# Patient Record
Sex: Female | Born: 1955 | Race: Black or African American | Hispanic: No | Marital: Married | Smoking: Never smoker
Health system: Southern US, Community
[De-identification: ages and names within clinical notes are randomized; demographics above are authoritative.]

## PROBLEM LIST (undated history)

## (undated) DIAGNOSIS — M3500 Sicca syndrome, unspecified: Secondary | ICD-10-CM

## (undated) DIAGNOSIS — R079 Chest pain, unspecified: Secondary | ICD-10-CM

## (undated) DIAGNOSIS — R51 Headache: Secondary | ICD-10-CM

## (undated) DIAGNOSIS — R748 Abnormal levels of other serum enzymes: Secondary | ICD-10-CM

## (undated) DIAGNOSIS — Z8719 Personal history of other diseases of the digestive system: Secondary | ICD-10-CM

## (undated) DIAGNOSIS — R1013 Epigastric pain: Secondary | ICD-10-CM

## (undated) DIAGNOSIS — K625 Hemorrhage of anus and rectum: Secondary | ICD-10-CM

## (undated) DIAGNOSIS — R197 Diarrhea, unspecified: Secondary | ICD-10-CM

## (undated) DIAGNOSIS — K219 Gastro-esophageal reflux disease without esophagitis: Secondary | ICD-10-CM

## (undated) DIAGNOSIS — M4802 Spinal stenosis, cervical region: Secondary | ICD-10-CM

## (undated) DIAGNOSIS — G4730 Sleep apnea, unspecified: Secondary | ICD-10-CM

## (undated) DIAGNOSIS — E041 Nontoxic single thyroid nodule: Secondary | ICD-10-CM

## (undated) DIAGNOSIS — E7800 Pure hypercholesterolemia, unspecified: Secondary | ICD-10-CM

## (undated) DIAGNOSIS — E079 Disorder of thyroid, unspecified: Secondary | ICD-10-CM

## (undated) DIAGNOSIS — M1990 Unspecified osteoarthritis, unspecified site: Secondary | ICD-10-CM

## (undated) DIAGNOSIS — J40 Bronchitis, not specified as acute or chronic: Secondary | ICD-10-CM

## (undated) DIAGNOSIS — R140 Abdominal distension (gaseous): Secondary | ICD-10-CM

## (undated) DIAGNOSIS — R110 Nausea: Secondary | ICD-10-CM

## (undated) HISTORY — DX: Bronchitis, not specified as acute or chronic: J40

## (undated) HISTORY — PX: ESOPHAGOGASTRODUODENOSCOPY: SHX1529

## (undated) HISTORY — PX: COLONOSCOPY: SHX174

## (undated) HISTORY — DX: Chest pain, unspecified: R07.9

## (undated) HISTORY — DX: Spinal stenosis, cervical region: M48.02

## (undated) HISTORY — DX: Personal history of other diseases of the digestive system: Z87.19

## (undated) HISTORY — DX: Abnormal levels of other serum enzymes: R74.8

## (undated) HISTORY — DX: Abdominal distension (gaseous): R14.0

## (undated) HISTORY — DX: Epigastric pain: R10.13

## (undated) HISTORY — PX: OTHER SURGICAL HISTORY: SHX169

## (undated) HISTORY — DX: Sjogren syndrome, unspecified: M35.00

## (undated) HISTORY — DX: Sleep apnea, unspecified: G47.30

## (undated) HISTORY — DX: Nausea: R11.0

## (undated) HISTORY — PX: EYE SURGERY: SHX253

## (undated) HISTORY — PX: ECTOPIC PREGNANCY SURGERY: SHX613

## (undated) HISTORY — DX: Hemorrhage of anus and rectum: K62.5

## (undated) HISTORY — PX: CHOLECYSTECTOMY: SHX55

## (undated) HISTORY — DX: Gastro-esophageal reflux disease without esophagitis: K21.9

## (undated) HISTORY — DX: Diarrhea, unspecified: R19.7

---

## 1982-09-05 HISTORY — PX: ECTOPIC PREGNANCY SURGERY: SHX613

## 1983-09-06 HISTORY — PX: CHOLECYSTECTOMY: SHX55

## 1997-11-24 ENCOUNTER — Other Ambulatory Visit: Admission: RE | Admit: 1997-11-24 | Discharge: 1997-11-24 | Payer: Self-pay | Admitting: Obstetrics and Gynecology

## 1998-11-26 ENCOUNTER — Other Ambulatory Visit: Admission: RE | Admit: 1998-11-26 | Discharge: 1998-11-26 | Payer: Self-pay | Admitting: Obstetrics and Gynecology

## 1998-12-30 ENCOUNTER — Ambulatory Visit (HOSPITAL_COMMUNITY): Admission: RE | Admit: 1998-12-30 | Discharge: 1998-12-30 | Payer: Self-pay | Admitting: Internal Medicine

## 1998-12-30 ENCOUNTER — Encounter: Payer: Self-pay | Admitting: Internal Medicine

## 1999-04-21 ENCOUNTER — Ambulatory Visit (HOSPITAL_COMMUNITY): Admission: RE | Admit: 1999-04-21 | Discharge: 1999-04-21 | Payer: Self-pay | Admitting: Gastroenterology

## 1999-09-02 ENCOUNTER — Emergency Department (HOSPITAL_COMMUNITY): Admission: EM | Admit: 1999-09-02 | Discharge: 1999-09-02 | Payer: Self-pay | Admitting: Emergency Medicine

## 1999-09-02 ENCOUNTER — Encounter: Payer: Self-pay | Admitting: Emergency Medicine

## 1999-09-03 ENCOUNTER — Encounter: Admission: RE | Admit: 1999-09-03 | Discharge: 1999-09-03 | Payer: Self-pay | Admitting: Gastroenterology

## 1999-09-03 ENCOUNTER — Encounter: Payer: Self-pay | Admitting: Gastroenterology

## 1999-11-10 ENCOUNTER — Other Ambulatory Visit: Admission: RE | Admit: 1999-11-10 | Discharge: 1999-11-10 | Payer: Self-pay | Admitting: Obstetrics and Gynecology

## 1999-11-13 ENCOUNTER — Inpatient Hospital Stay (HOSPITAL_COMMUNITY): Admission: AD | Admit: 1999-11-13 | Discharge: 1999-11-13 | Payer: Self-pay | Admitting: Obstetrics and Gynecology

## 2000-03-24 ENCOUNTER — Encounter (INDEPENDENT_AMBULATORY_CARE_PROVIDER_SITE_OTHER): Payer: Self-pay | Admitting: Specialist

## 2000-03-24 ENCOUNTER — Ambulatory Visit (HOSPITAL_COMMUNITY): Admission: RE | Admit: 2000-03-24 | Discharge: 2000-03-24 | Payer: Self-pay | Admitting: Gastroenterology

## 2000-04-17 ENCOUNTER — Encounter: Admission: RE | Admit: 2000-04-17 | Discharge: 2000-04-17 | Payer: Self-pay | Admitting: Obstetrics and Gynecology

## 2000-04-17 ENCOUNTER — Encounter: Payer: Self-pay | Admitting: Obstetrics and Gynecology

## 2000-10-16 ENCOUNTER — Encounter: Payer: Self-pay | Admitting: Emergency Medicine

## 2000-10-16 ENCOUNTER — Emergency Department (HOSPITAL_COMMUNITY): Admission: EM | Admit: 2000-10-16 | Discharge: 2000-10-16 | Payer: Self-pay | Admitting: Emergency Medicine

## 2000-10-30 ENCOUNTER — Encounter (INDEPENDENT_AMBULATORY_CARE_PROVIDER_SITE_OTHER): Payer: Self-pay

## 2000-10-30 ENCOUNTER — Inpatient Hospital Stay (HOSPITAL_COMMUNITY): Admission: RE | Admit: 2000-10-30 | Discharge: 2000-11-02 | Payer: Self-pay | Admitting: Obstetrics and Gynecology

## 2000-11-18 ENCOUNTER — Encounter: Payer: Self-pay | Admitting: Obstetrics and Gynecology

## 2000-11-18 ENCOUNTER — Inpatient Hospital Stay (HOSPITAL_COMMUNITY): Admission: AD | Admit: 2000-11-18 | Discharge: 2000-11-18 | Payer: Self-pay | Admitting: Obstetrics and Gynecology

## 2001-04-24 ENCOUNTER — Encounter: Admission: RE | Admit: 2001-04-24 | Discharge: 2001-04-24 | Payer: Self-pay | Admitting: Obstetrics and Gynecology

## 2001-04-24 ENCOUNTER — Encounter: Payer: Self-pay | Admitting: Obstetrics and Gynecology

## 2002-01-01 ENCOUNTER — Other Ambulatory Visit: Admission: RE | Admit: 2002-01-01 | Discharge: 2002-01-01 | Payer: Self-pay | Admitting: Obstetrics and Gynecology

## 2002-04-04 ENCOUNTER — Encounter: Admission: RE | Admit: 2002-04-04 | Discharge: 2002-04-04 | Payer: Self-pay | Admitting: Obstetrics and Gynecology

## 2002-04-04 ENCOUNTER — Encounter: Payer: Self-pay | Admitting: Obstetrics and Gynecology

## 2003-01-14 ENCOUNTER — Other Ambulatory Visit: Admission: RE | Admit: 2003-01-14 | Discharge: 2003-01-14 | Payer: Self-pay | Admitting: Obstetrics and Gynecology

## 2003-05-06 ENCOUNTER — Encounter: Payer: Self-pay | Admitting: Obstetrics and Gynecology

## 2003-05-06 ENCOUNTER — Encounter: Admission: RE | Admit: 2003-05-06 | Discharge: 2003-05-06 | Payer: Self-pay | Admitting: Obstetrics and Gynecology

## 2004-05-05 ENCOUNTER — Ambulatory Visit (HOSPITAL_COMMUNITY): Admission: RE | Admit: 2004-05-05 | Discharge: 2004-05-05 | Payer: Self-pay | Admitting: Gastroenterology

## 2004-05-18 ENCOUNTER — Encounter: Admission: RE | Admit: 2004-05-18 | Discharge: 2004-05-18 | Payer: Self-pay | Admitting: Obstetrics and Gynecology

## 2004-05-20 ENCOUNTER — Other Ambulatory Visit: Admission: RE | Admit: 2004-05-20 | Discharge: 2004-05-20 | Payer: Self-pay | Admitting: Obstetrics and Gynecology

## 2004-08-16 ENCOUNTER — Ambulatory Visit (HOSPITAL_COMMUNITY): Admission: RE | Admit: 2004-08-16 | Discharge: 2004-08-16 | Payer: Self-pay | Admitting: Internal Medicine

## 2004-12-13 ENCOUNTER — Ambulatory Visit (HOSPITAL_COMMUNITY): Admission: RE | Admit: 2004-12-13 | Discharge: 2004-12-13 | Payer: Self-pay | Admitting: Internal Medicine

## 2004-12-13 ENCOUNTER — Encounter: Admission: RE | Admit: 2004-12-13 | Discharge: 2004-12-13 | Payer: Self-pay | Admitting: Internal Medicine

## 2005-01-20 ENCOUNTER — Emergency Department (HOSPITAL_COMMUNITY): Admission: EM | Admit: 2005-01-20 | Discharge: 2005-01-20 | Payer: Self-pay | Admitting: *Deleted

## 2005-05-12 ENCOUNTER — Other Ambulatory Visit: Admission: RE | Admit: 2005-05-12 | Discharge: 2005-05-12 | Payer: Self-pay | Admitting: Obstetrics and Gynecology

## 2005-06-17 ENCOUNTER — Ambulatory Visit (HOSPITAL_COMMUNITY): Admission: RE | Admit: 2005-06-17 | Discharge: 2005-06-17 | Payer: Self-pay | Admitting: Obstetrics and Gynecology

## 2005-06-30 ENCOUNTER — Encounter: Admission: RE | Admit: 2005-06-30 | Discharge: 2005-06-30 | Payer: Self-pay | Admitting: Obstetrics and Gynecology

## 2005-09-05 HISTORY — PX: ABDOMINAL HYSTERECTOMY: SHX81

## 2005-09-06 ENCOUNTER — Encounter (INDEPENDENT_AMBULATORY_CARE_PROVIDER_SITE_OTHER): Payer: Self-pay | Admitting: *Deleted

## 2005-09-06 ENCOUNTER — Inpatient Hospital Stay (HOSPITAL_COMMUNITY): Admission: RE | Admit: 2005-09-06 | Discharge: 2005-09-08 | Payer: Self-pay | Admitting: Obstetrics and Gynecology

## 2005-11-25 ENCOUNTER — Ambulatory Visit (HOSPITAL_COMMUNITY): Admission: RE | Admit: 2005-11-25 | Discharge: 2005-11-25 | Payer: Self-pay | Admitting: Internal Medicine

## 2006-01-23 ENCOUNTER — Encounter: Admission: RE | Admit: 2006-01-23 | Discharge: 2006-01-23 | Payer: Self-pay | Admitting: Obstetrics and Gynecology

## 2006-02-17 ENCOUNTER — Encounter: Admission: RE | Admit: 2006-02-17 | Discharge: 2006-05-18 | Payer: Self-pay | Admitting: Neurology

## 2006-03-27 ENCOUNTER — Encounter: Admission: RE | Admit: 2006-03-27 | Discharge: 2006-03-27 | Payer: Self-pay | Admitting: Gastroenterology

## 2006-07-31 ENCOUNTER — Encounter: Admission: RE | Admit: 2006-07-31 | Discharge: 2006-07-31 | Payer: Self-pay | Admitting: Orthopaedic Surgery

## 2006-08-02 ENCOUNTER — Encounter (INDEPENDENT_AMBULATORY_CARE_PROVIDER_SITE_OTHER): Payer: Self-pay | Admitting: Specialist

## 2006-08-02 ENCOUNTER — Other Ambulatory Visit: Admission: RE | Admit: 2006-08-02 | Discharge: 2006-08-02 | Payer: Self-pay | Admitting: Diagnostic Radiology

## 2006-08-02 ENCOUNTER — Encounter: Admission: RE | Admit: 2006-08-02 | Discharge: 2006-08-02 | Payer: Self-pay | Admitting: Orthopaedic Surgery

## 2006-12-11 ENCOUNTER — Emergency Department (HOSPITAL_COMMUNITY): Admission: EM | Admit: 2006-12-11 | Discharge: 2006-12-11 | Payer: Self-pay | Admitting: Emergency Medicine

## 2006-12-21 ENCOUNTER — Encounter (INDEPENDENT_AMBULATORY_CARE_PROVIDER_SITE_OTHER): Payer: Self-pay | Admitting: *Deleted

## 2006-12-21 ENCOUNTER — Ambulatory Visit (HOSPITAL_COMMUNITY): Admission: RE | Admit: 2006-12-21 | Discharge: 2006-12-21 | Payer: Self-pay | Admitting: Internal Medicine

## 2007-01-05 ENCOUNTER — Encounter: Admission: RE | Admit: 2007-01-05 | Discharge: 2007-01-05 | Payer: Self-pay | Admitting: Internal Medicine

## 2007-01-25 ENCOUNTER — Encounter: Admission: RE | Admit: 2007-01-25 | Discharge: 2007-01-25 | Payer: Self-pay | Admitting: Obstetrics and Gynecology

## 2007-02-08 ENCOUNTER — Encounter: Admission: RE | Admit: 2007-02-08 | Discharge: 2007-02-08 | Payer: Self-pay | Admitting: Surgery

## 2007-02-15 ENCOUNTER — Ambulatory Visit (HOSPITAL_COMMUNITY): Admission: RE | Admit: 2007-02-15 | Discharge: 2007-02-15 | Payer: Self-pay | Admitting: Gastroenterology

## 2007-02-15 ENCOUNTER — Encounter (INDEPENDENT_AMBULATORY_CARE_PROVIDER_SITE_OTHER): Payer: Self-pay | Admitting: Gastroenterology

## 2007-04-10 ENCOUNTER — Emergency Department (HOSPITAL_COMMUNITY): Admission: EM | Admit: 2007-04-10 | Discharge: 2007-04-10 | Payer: Self-pay | Admitting: Emergency Medicine

## 2007-04-23 ENCOUNTER — Emergency Department (HOSPITAL_COMMUNITY): Admission: EM | Admit: 2007-04-23 | Discharge: 2007-04-23 | Payer: Self-pay | Admitting: Emergency Medicine

## 2007-10-23 ENCOUNTER — Emergency Department (HOSPITAL_COMMUNITY): Admission: EM | Admit: 2007-10-23 | Discharge: 2007-10-23 | Payer: Self-pay | Admitting: Emergency Medicine

## 2007-10-26 ENCOUNTER — Encounter: Admission: RE | Admit: 2007-10-26 | Discharge: 2007-10-26 | Payer: Self-pay | Admitting: Surgery

## 2007-12-24 ENCOUNTER — Encounter: Admission: RE | Admit: 2007-12-24 | Discharge: 2007-12-24 | Payer: Self-pay | Admitting: Neurosurgery

## 2008-01-29 ENCOUNTER — Encounter: Admission: RE | Admit: 2008-01-29 | Discharge: 2008-01-29 | Payer: Self-pay | Admitting: Obstetrics and Gynecology

## 2008-04-21 ENCOUNTER — Encounter: Admission: RE | Admit: 2008-04-21 | Discharge: 2008-04-21 | Payer: Self-pay | Admitting: Surgery

## 2008-10-29 ENCOUNTER — Emergency Department (HOSPITAL_COMMUNITY): Admission: EM | Admit: 2008-10-29 | Discharge: 2008-10-29 | Payer: Self-pay | Admitting: Emergency Medicine

## 2009-01-09 ENCOUNTER — Encounter: Admission: RE | Admit: 2009-01-09 | Discharge: 2009-01-09 | Payer: Self-pay | Admitting: Chiropractic Medicine

## 2009-01-29 ENCOUNTER — Encounter: Admission: RE | Admit: 2009-01-29 | Discharge: 2009-01-29 | Payer: Self-pay | Admitting: Obstetrics and Gynecology

## 2009-03-23 ENCOUNTER — Encounter: Admission: RE | Admit: 2009-03-23 | Discharge: 2009-03-23 | Payer: Self-pay | Admitting: Otolaryngology

## 2009-05-04 ENCOUNTER — Ambulatory Visit (HOSPITAL_COMMUNITY): Admission: RE | Admit: 2009-05-04 | Discharge: 2009-05-04 | Payer: Self-pay | Admitting: Obstetrics and Gynecology

## 2009-12-18 ENCOUNTER — Encounter: Admission: RE | Admit: 2009-12-18 | Discharge: 2009-12-18 | Payer: Self-pay | Admitting: Surgery

## 2009-12-23 ENCOUNTER — Encounter: Admission: RE | Admit: 2009-12-23 | Discharge: 2009-12-23 | Payer: Self-pay | Admitting: Obstetrics and Gynecology

## 2010-02-08 ENCOUNTER — Encounter: Admission: RE | Admit: 2010-02-08 | Discharge: 2010-02-08 | Payer: Self-pay | Admitting: Obstetrics and Gynecology

## 2010-08-11 ENCOUNTER — Encounter: Admission: RE | Admit: 2010-08-11 | Discharge: 2010-08-11 | Payer: Self-pay | Source: Home / Self Care

## 2010-09-20 ENCOUNTER — Encounter
Admission: RE | Admit: 2010-09-20 | Discharge: 2010-09-20 | Payer: Self-pay | Source: Home / Self Care | Attending: Gastroenterology | Admitting: Gastroenterology

## 2010-09-25 ENCOUNTER — Encounter: Payer: Self-pay | Admitting: Internal Medicine

## 2010-09-26 ENCOUNTER — Encounter: Payer: Self-pay | Admitting: Internal Medicine

## 2011-01-04 ENCOUNTER — Other Ambulatory Visit: Payer: Self-pay | Admitting: Obstetrics and Gynecology

## 2011-01-04 DIAGNOSIS — Z1231 Encounter for screening mammogram for malignant neoplasm of breast: Secondary | ICD-10-CM

## 2011-01-18 NOTE — Op Note (Signed)
[name] [name], [name] [name]                ACCOUNT NO.:  [id number]   MEDICAL RECORD NO.:  [id number]          PATIENT TYPE:  AMB   LOCATION:  ENDO                         FACILITY:  [facility]   PHYSICIAN:  Bernette Redbird, M.D.   DATE OF BIRTH:  1955-10-11   DATE OF PROCEDURE:  02/15/2007  DATE OF DISCHARGE:                               OPERATIVE REPORT   PROCEDURE:  Colonoscopy with biopsy.   INDICATION:  A 55 year old female with family history of colon cancer in  her mother at age 29, who had a negative screening colonoscopy 3 years  ago, but whose recent CT scan raised a question of an abnormality in the  transverse colon.   FINDINGS:  Diminutive polyp at 40 cm, otherwise normal exam to terminal  ileum.   DESCRIPTION OF PROCEDURE:  The nature, purpose, and risks of the  procedure were familiar to the patient from prior examinations; and she  provided written consent.  Sedation for this procedure and the upper  endoscopy which preceded it totaled:  Fentanyl 150 mcg and Versed 15 mg  IV without arrhythmias or desaturation.   The Pentax adult video colonoscope was advanced with moderate difficulty  due to looping, overcome by turning the patient early on into the supine  position, and using external abdominal compression which was effectively  applied and allowed [place] to advance to the terminal ileum which had a  normal appearance, after which pullback was performed.  The quality of  prep was excellent; and it was felt that all areas were well seen.   The only abnormality on this exam was a 2-mm sessile polyp removed by a  single cold biopsy and about 40 cm from the external anal opening.  No  other polyps were seen; and there was no evidence of cancer, colitis,  vascular malformations or diverticulosis.  Retroflexion in the rectum  and reinspection of the rectum were normal.   In particular, I did not see any transverse colonic abnormalities to  correlate with the possible abnormality on  her recent CT scan.   IMPRESSION:  1. Solitary diminutive sessile polyp removed as described above (Code      211.3)  2. Family history of colon cancer.   PLAN:  1. Await pathology on polyp.  2. Followup colonoscopy in 5 years in view of the family history of      colon cancer, regardless of the histologic findings on this      diminutive polyp from today.           ______________________________  Bernette Redbird, M.D.     RB/MEDQ  D:  02/15/2007  T:  02/15/2007  Job:  [id number]   cc:   Margaretmary Bayley, M.D.  Fax: [phone]   Duke Salvia. Marcelle Overlie, M.D.  Fax: [phone]

## 2011-01-18 NOTE — Op Note (Signed)
[name] [name], [name] [name]                ACCOUNT NO.:  [id number]   MEDICAL RECORD NO.:  [id number]          PATIENT TYPE:  AMB   LOCATION:  ENDO                         FACILITY:  [facility]   PHYSICIAN:  Bernette Redbird, M.D.   DATE OF BIRTH:  05/31/1956   DATE OF PROCEDURE:  02/15/2007  DATE OF DISCHARGE:                               OPERATIVE REPORT   PROCEDURE:  Upper endoscopy with Bravo capsule placement.   INDICATION:  A 55 year old female with recurring chest pain despite PPI  therapy.  This procedure is being done with the patient off Nexium  therapy for the past five days, to assess whether there is a correlation  between reflux and her symptoms   FINDINGS:  Small hiatal hernia otherwise normal exam.  Successful Bravo  capsule placement.   PROCEDURE:  The patient provided written consent for the procedure.  Sedation was fentanyl 100 mcg and Versed 10 mg IV without arrhythmias or  clinical instability.  The Pentax video endoscope was passed under  direct vision.  I got a brief look at the larynx and it looked normal  other than perhaps a little bit of thickening of the posterior  commissure which could represent some mild reflux laryngitis.   The esophageal mucosa was normal, without evidence of free reflux,  reflux esophagitis, Barrett's esophagus, varices, infection, or  neoplasia.  There was a 2 cm hiatal hernia present but I did not see  anything that I thought was Barrett's esophagus.   The stomach contained no significant residual and had normal mucosa  without evidence of gastritis, erosions, ulcers, polyps or masses  including a retroflexed view of the cardia, and the pylorus, duodenal  bulb and second duodenum looked normal.   The squamocolumnar junction was at about 41 cm; the endoscope was  removed and the Bravo capsule probe was marked at 35 cm and passed  blindly into the esophagus up to the measurement point of 35 cm.  Endoscopic confirmation of appropriate entry  into the esophagus was  performed, then suction was applied to the Bravo capsule for 45 seconds,  the plunger was activated and the introducer removed, with successful  release of the Bravo capsule.  Attachment to the esophageal mucosa was  confirmed endoscopically by endoscoping the patient again under direct  vision.   The patient tolerated the procedure well and there were no apparent  complications.   IMPRESSION:  Chronic reflux symptoms with chest pain, possibly due to  gastroesophageal reflux disease (786.51).  Normal endoscopy  apart from  small hiatal hernia.  Successful placement of Bravo capsule.   PLAN:  Await Bravo capsule readings and correlate with symptoms with  follow-up in the office in the near future.           ______________________________  Bernette Redbird, M.D.     RB/MEDQ  D:  02/15/2007  T:  02/15/2007  Job:  [id number]   cc:   Margaretmary Bayley, M.D.  Fax: [phone]

## 2011-01-21 NOTE — Discharge Summary (Signed)
[name] [name], [name] [name]                ACCOUNT NO.:  [id number]   MEDICAL RECORD NO.:  [id number]          PATIENT TYPE:  INP   LOCATION:  9320                          FACILITY:  [facility]   PHYSICIAN:  Duke Salvia. Marcelle Overlie, M.D.DATE OF BIRTH:  1956-03-18   DATE OF ADMISSION:  09/06/2005  DATE OF DISCHARGE:  09/08/2005                                 DISCHARGE SUMMARY   DISCHARGE DIAGNOSES:  1.  Chronic left lower quadrant pain.  2.  Left adnexal adhesions.  3.  Laparoscopy followed by laparotomy with bilateral salpingo-oophorectomy      and lysis of adhesions, excision of keloid this admission.   HISTORY OF PRESENT ILLNESS:  For summary of the history and physical  examination, please see admission H and P for details. Briefly, a 55-year-  old who has had a prior total abdominal hysterectomy in 2002 who has  continued to have primarily left lower quadrant pain felt probably secondary  to adnexal adhesions who presents now for laparoscopy with bilateral  salpingo-oophorectomy.   HOSPITAL COURSE:  On September 06, 2005 under general anesthesia the patient  underwent laparoscopy followed by laparotomy with bilateral salpingo-  oophorectomy, lysis of adhesions and excision of umbilical incisional  keloid.  On the first postoperative day the catheter was removed.  She was  ambulating without difficulty, afebrile and her diet was advanced.  On the  second postoperative day she was afebrile, her abdominal examination was  unremarkable and the incision was clean, dry and she was ready for discharge  at that point.   LABORATORY DATA:  Urinalysis on admission negative except for small  hemoglobin.  Preoperative hemoglobin 14.3, postoperative CBC on September 07, 2005 white blood cell count 9.7, hemoglobin 11.4.   DISPOSITION:  Patient discharged on Tylox one or two p.o. q.4-6h. PRN pain.  Will return to the office in three days to have the clips removed.  Patient  is advised to report any incisional  redness or drainage, increased pain or  bleeding or fever over 101.  She was given specific instructions regarding  diet, sex and exercise.   CONDITION ON DISCHARGE:  Good.   ACTIVITY:  Graded increase.      Richard M. Marcelle Overlie, M.D.  Electronically Signed     RMH/MEDQ  D:  09/08/2005  T:  09/08/2005  Job:  [id number]

## 2011-01-21 NOTE — H&P (Signed)
NAMEKRISS, [name] [name] NO.:  [id number]   MEDICAL RECORD NO.:  [id number]           PATIENT TYPE:   LOCATION:                                FACILITY:  [facility]   PHYSICIAN:  Duke Salvia. Marcelle Overlie, M.D.DATE OF BIRTH:  05-21-1956   DATE OF ADMISSION:  09/06/2005  DATE OF DISCHARGE:                                HISTORY & PHYSICAL   Surgery is scheduled for 09/06/05.   CHIEF COMPLAINT:  Pelvic pain.   HISTORY OF PRESENT ILLNESS:  This 55 year old who has had a prior total  abdominal hysterectomy in 2002.  At that time, the ovaries were normal and  they were conserved.  Over the past several years, she has continued to be  bothered by pelvic discomfort.  In the process of evaluating her with CT,  she was noted to have no acute intarpelvic pathology, but did have a renal  cyst evaluated by Dr. Annabell Howells and the possibility of a ventral hernia.  Due to  continued problems with pain, she presents now for laparoscopic BSO,  possible laparotomy with BSO.  This procedure including risks of bleeding,  infection, adjacent organ injury, the possible need for open or additional  surgery reviewed.  She has formed keloid in the past and other risks  relative to phlebitis, wound infection, and keloid formation are all  reviewed with her which she understands and accepts.   ALLERGIES:  None.   PAST SURGICAL HISTORY:  Total abdominal hysterectomy, cholecystectomy,  ectopic pregnancy, tubal ligation.   REVIEW OF SYSTEMS:  Significant for history of migraine headaches.   CURRENT MEDICATIONS:  Estrasorb for ERT.   FAMILY HISTORY:  Significant for mother with colon cancer.  The patient  herself has had a recent negative screening colonoscopy.   PHYSICAL EXAMINATION:  Temperature 98.2, blood pressure 104/70.  HEENT  unremarkable.  Neck is supple without masses.  Lungs are clear.  Cardiovascular:  Regular rate and rhythm without murmurs, rubs, or gallops.  Chest without masses.  Abdomen is  soft, flat, and nontender.  Pelvic exam:  Normal external genitalia.  Vaginal cuff clear.  Bimanual negative.  Extremities and neurologic exam unremarkable.   IMPRESSION:  Chronic pelvic pain, possible adnexal adhesions.   PLAN:  Laparoscopy with laparoscopic BSO and possible laparotomy with BSO.  Procedures and risks reviewed as above.      Richard M. Marcelle Overlie, M.D.  Electronically Signed     RMH/MEDQ  D:  09/02/2005  T:  09/02/2005  Job:  [id number]

## 2011-01-21 NOTE — Op Note (Signed)
NAMEBREXLEY, [name] [name]                ACCOUNT NO.:  [id number]   MEDICAL RECORD NO.:  [id number]          PATIENT TYPE:  INP   LOCATION:  9399                          FACILITY:  [facility]   PHYSICIAN:  Duke Salvia. Marcelle Overlie, M.D.DATE OF BIRTH:  Sep 22, 1955   DATE OF PROCEDURE:  09/06/2005  DATE OF DISCHARGE:                                 OPERATIVE REPORT   PREOPERATIVE DIAGNOSIS:  Chronic pelvic pain, possible adnexal adhesions.   POSTOPERATIVE DIAGNOSIS:  Bilateral adnexal adhesions.   PROCEDURE:  Diagnostic laparoscopy followed by laparotomy with BSO, lysis of  adhesions, excision of keloid nodule at the umbilicus with repair and  placement of ON-Q catheters x2.   SURGEON:  Duke Salvia. Marcelle Overlie, M.D.   ANESTHESIA:  General endotracheal.   COMPLICATIONS:  None.   DRAINS:  Foley catheter.   BLOOD LOSS:  100 mL.   SPECIMENS REMOVED:  1.  Keloid nodule.  2.  Bilateral tubes and ovaries.   PROCEDURE AND FINDINGS:  The patient taken to the operating room after an  adequate level of general endotracheal anesthesia was obtained.  With the  patient legs in stirrups, the abdomen, perineum and vagina were prepped and  draped in the usual manner for laparoscopy. Bladder was drained with Foley  catheter. The subumbilical area was infiltrated with 0.5% Marcaine plain,  small incision was made and the Veress needle was introduced without  difficulty. Its intra-abdominal position verified by pressure and water  testing. After a 3 liter pneumoperitoneum is then created, a laparoscopic  trocar and sleeve were then introduced without difficulty. Three  fingerbreadths above the symphysis in the midline, a 5 mm trocar was  inserted under direct visualization. The patient then placed in  Trendelenburg. The pelvic findings as follows.   The right tube and ovary had some mild adnexal adhesions, the uterus was  surgically absent. The vaginal cuff area and posterior cul-de-sac were free  and clear. The  left tube and ovary were densely adherent to left pelvic  sidewall could not be dissected free with laparoscopic technique.  Decision  made to proceed with laparotomy. She had received preop antibiotics. After  the laparoscopic instruments were removed, the significant 3 cm keloid  nodule just above the umbilicus was excised. Primary repair with 4-0 nylon  sutures and 4-0 Vicryl subcuticular on the subumbilical incision. These  areas were infiltrated with triamcinolone acetate 5 mg/mL dilution.  Attention directed to the excision of the significant Pfannenstiel keloid  scar. This was excised and ellipse carried down the fascia which was incised  and extended transversely. Rectus muscle divided in the midline. Peritoneum  entered superiorly without incident and extended in a vertical fashion.  O'Connor-O'Sullivan retractor was positioned. Bowels packed superiorly out  of the field. The patient placed in slight Trendelenburg. Starting on the  right, the tube and ovary were grasped with a Babcock clamp. The course of  the right ureter was seen to be well below the operative site. The right IP  ligament was isolated, doubly free tied and divided. The remainder was  clamped, cut and tied excising the right  tube and ovary. This operative site  was hemostatic. On the left side, the same was repeated after dissecting the  ovary from surrounding adhesions. The course of the ureter was seen well  below the operative site.  The left IP ligament was isolated, doubly  clamped, first free tied followed by suture ligature of 0 Vicryl. The  remainder of the left tube and ovary was removed by clamp, cut and tie  technique. Irrigation was carried out.  The operative site on each side was  inspected and noted to be hemostatic. The course of the ureter was seen to  be peristalsing through the peritoneum well below the operative site on  either side.  Prior to closure, sponge, needle and instrument counts were   reported as correct x2. Perineum closed with a running 2-0 of Vicryl suture.  Rectus muscles closed with interrupted 2-0 Vicryl sutures. The ON-Q  subfascial catheter was positioned and the fascia closed from laterally to  midline on either side with 0 PDS suture. The subcutaneous ON-Q catheter was  then positioned and the skin closed with clips and a pressure dressing  applied. This lower incision too was infiltrated with triamcinolone acetate  5 mg/mL to try to reduce keloid formation. Pressure dressing applied. She  tolerated this well and went to the recovery room in good condition.      Richard M. Marcelle Overlie, M.D.  Electronically Signed     RMH/MEDQ  D:  09/06/2005  T:  09/06/2005  Job:  [id number]

## 2011-01-21 NOTE — Discharge Summary (Signed)
[facility]  Patient:    [name] [name], [name] [name]                       MRN: [id number] Adm. Date:  [id number] Disc. Date: [id number] Attending:  Rhina Brackett                           Discharge Summary  DISCHARGE DIAGNOSES:          1. Symptomatic leiomyomata.                               2. Laparoscopy followed by total abdominal                                  hysterectomy this admission.  SUMMARY:                      Please see admission H&P for details.  Briefly, a 55 year old G3, P2, previous tubal ligation with symptomatic fibroids presents for hysterectomy.  HOSPITAL COURSE:              On February 25 under general anesthesia the patient underwent TAH without complication.  Her first postoperative day she had a low grade fever of 100, 99.1.  By the time she was up ambulating and the catheter had been removed, she remained afebrile.  She was started on clear liquids postoperative day #1 hemoglobin 8.9.  On postoperative day #2 her diet was advanced.  She again had the low grade fever 99.1 but the incision was clean and dry.  Her abdominal examination was unremarkable.  By February 28 postoperative day #3 she was feeling much improved, tolerating a regular diet, had established normal bowel and urinary function, remained afebrile, and was ready for discharge.  LABORATORIES:                 Preoperative hemoglobin 11.9.  On February 25 was 9.1, on February 26 was 8.9.  SMA 23 was normal.  UA on admission unremarkable except for trace hemoglobin.  Blood type B+.  Antibody screen was negative.  Pathology report is still pending.  DISPOSITION:                  The patient is discharged on Tylox p.r.n. pain, Hemocyte once daily.  Will return to the office in one week.  Advised to report any incisional redness or drainage, increased pain or bleeding, or fever over 101.  Was given specific instructions regarding diet, sex, exercise.  CONDITION  ON DISCHARGE:       Good.  ACTIVITY:                     Slow increase. DD:  11/02/00 TD:  11/02/00 Job: [id number]

## 2011-01-21 NOTE — Op Note (Signed)
[name] [name], CAMPISE                ACCOUNT NO.:  [id number]   MEDICAL RECORD NO.:  [id number]          PATIENT TYPE:  INP   LOCATION:  NA                           FACILITY:  [facility]   PHYSICIAN:  Bernette Redbird, M.D.   DATE OF BIRTH:  1956-01-29   DATE OF PROCEDURE:  02/17/2007  DATE OF DISCHARGE:  12/11/2006                               OPERATIVE REPORT   PROCEDURE:  A pH monitoring study.   INDICATIONS:  A 54 year old female with GERD and chest pain symptoms.   FINDINGS:  Mild to moderate reflux present.   PROCEDURE:  The procedure had been explained to the patient.  The Bravo  capsule was placed endoscopically, at which time a small hiatal hernia  was present but no endoscopic evidence of reflux esophagitis was seen  and the larynx looked grossly normal.  Please see separately dictated  procedure report for the description of the endoscopy for the procedure  itself.   FINDINGS:  On both days of the monitoring period, she had a moderate  elevation of the DeMeester score (22 and 27 respectively, with normal  being less than 15).  This reflex was present almost entirely during the  daytime, predominantly postprandially.   Note that this monitoring study was done with the patient off PPI  therapy.   There was excellent correlation between her chest pain and her reflux  and also between her heartburn and her documented reflux episodes.  This  correlation of symptoms with a drop in pH also occurred during her  nocturnal symptoms.   PLAN:  Attempts at weight loss with also frequent small feedings to  reduce postprandial reflux symptomatology.  Continue Nexium and  nocturnal H2 blocker therapy.           ______________________________  Bernette Redbird, M.D.     RB/MEDQ  D:  03/26/2007  T:  03/27/2007  Job:  [id number]   cc:   Margaretmary Bayley, M.D.  Fax: [phone]   Duke Salvia. Marcelle Overlie, M.D.  Fax: [phone]

## 2011-01-21 NOTE — Op Note (Signed)
[facility]  Patient:    [name] [name], GROSVENOR                       MRN: [id number] Proc. Date: 10/30/00 Adm. Date:  [id number] Attending:  Rhina Brackett                           Operative Report  PREOPERATIVE DIAGNOSIS:       Symptomatic leiomyoma.  POSTOPERATIVE DIAGNOSIS:      Symptomatic leiomyoma.  PROCEDURE:                    Diagnostic laparoscopy followed by laparotomy and total abdominal hysterectomy.  SURGEON:                      Duke Salvia. Marcelle Overlie, M.D.  ASSISTANTWilley Blade, M.D.  ANESTHESIA:                   General endotracheal.  COMPLICATIONS:                None.  DRAINS:                       Foley catheter.  ESTIMATED BLOOD LOSS:         150.  PROCEDURE AND FINDINGS:       The patient went to the operating room.  After an adequate level of general endotracheal anesthesia was obtained with the patients legs in stirrups, the abdomen, perineum and vagina were prepped and draped in the usual manner for laparoscopy.  The bladder was drained.  EUA was carried out.  The uterus was 10 weeks in size and irregular, especially to the left.  A Hulka tenaculum was positioned.  There was not good descent noted, even with her asleep.  After the bladder was drained, a small subumbilical incision was made.  A Veress needle was introduced without difficulty.  A 2 L pneumoperitoneum was then created.  A laparoscopic trocar and sleeve were then introduced without difficulty.  There was no evidence of any bleeding or trauma.  Three fingerbreadths above the symphysis in the midline, a second puncture was made and a blunt probe was inserted under direct visualization. With the patient in Trendelenburg, the pelvic findings were as follows.  The uterus itself was 10 weeks in size.  The ovaries appeared to be normal. The cul-de-sac was free and clear.  Several fibroids were noted on the serosa and one large fibroid  noted in the left broad ligament area extending down under the bladder flap.  Because of this location, the decision was made to abandon the vaginal approach.  The patients legs were places supine.  A Foley catheter was positioned.  She had already been prepped and draped adequately. The surgeon regloved and gowned.  A Pfannenstiel incision incision was made three fingerbreadths above the symphysis and carried down to the fascia, which was incised and extended transversely.  The rectus muscles were divide in the midline.  The peritoneum was entered without incident and extended vertically. An OConnor-OSullivan retractor was positioned after the bowel was packed superiorly out of the field.  Long [name] clamps were then placed at each utero-ovarian pedicle.  Starting on the left, the round ligament was clamped, divided  and suture ligated with 0 Dexon.  Sharp and blunt dissection was then used to dissect around the left broad ligament fibroid.  The left utero-ovarian pedicle was isolated, clamped, divided and first free tied, followed by suture ligature of Dexon, conserving the left ovary.  Careful dissection again around the left broad ligament fibroid until the uterine vasculature pedicle was noted.  After assuring that the bladder was well below this, it was clamped, divided and suture ligated with 0 Dexon.  The exact same was repeated on the opposite side, again conserving the right ovary.  After assuring that the bladder was dissected below cervicovaginal junction, the cardinal ligaments, uterosacral ligaments and cervicovaginal pedicles were clamped, divided and suture ligated with 0 Dexon and the specimen was excised. The cuff was closed with a running locked 2-0 Dexon suture.  The pelvis was irrigated with saline and aspirated.  All major pedicles were inspected and noted to be hemostatic.  The ovaries were then suspended to the round ligament on each side with a figure-of-eight 2-0  Dexon sutured.  Reinspection again revealed hemostasis.  The cecum, upper abdomen and appendix were inspected and noted to be unremarkable.  Prior to closures, sponge, needle and instrument counts were reported as correct x 2.  The rectus muscles were reapproximated in the midline with 2-0 Dexon interrupted sutures.  The fascia was closed from lateral to midline with a 0 Dexon running suture.  The subcutaneous fat was hemostatic after irrigation.  A diluted solution of Kenalog was injected into the subcu tissues, since she had been very prone to keloid formation.  This was approximately 100 mg of Kenalog in 30 cc of saline.  We used about 24 cc. Clips and Steri-Strips were used on the skin.  Clear urine was noted at the end of the case.  She tolerated this well and received Toradol 30 mg IV at the end of the case and went to the recovery room in good condition. DD:  10/30/00 TD:  10/30/00 Job: [id number]

## 2011-01-21 NOTE — Op Note (Signed)
[name] [name], SYBERT                          ACCOUNT NO.:  [id number]   MEDICAL RECORD NO.:  [id number]                   PATIENT TYPE:  AMB   LOCATION:  ENDO                                 FACILITY:  [facility]   PHYSICIAN:  Bernette Redbird, M.D.                DATE OF BIRTH:  1955-11-07   DATE OF PROCEDURE:  05/05/2004  DATE OF DISCHARGE:                                 OPERATIVE REPORT   PROCEDURE:  Colonoscopy.   INDICATIONS FOR PROCEDURE:  Family history of colon cancer in a 55 year old  female, whose mother developed colon cancer around age 64.  The patient has  had two previous colonoscopies, the most recent one five years ago, being  negative for any adenomas.   FINDINGS:  Normal exam to the terminal ileum.   DESCRIPTION OF PROCEDURE:  The nature, purpose and risk of the procedure  were familiar to the patient from prior examination. She provided written  consent. Sedation was fentanyl 100 mcg and Versed 9 mg IV without  arrhythmias or desaturations. The Olympus adjustable tension adult video  colonoscope was advanced with moderate difficulty due to looping around the  colon to the terminal ileum, placing the patient in supine position and  applying some external abdominal compression to control loop. The terminal  ileum had a normal appearance and pullback was then performed. The quality  of the prep was excellent and it was felt that all areas were well seen.   This was a normal examination.   No polyps, cancer, colitis, vascular malformations or diverticular disease  were observed. Retroflexion of the rectum and reinspection of the rectum  were normal.  No biopsies were obtained.  She tolerated the procedure well  and there were no apparent complications.   IMPRESSION:  Family history of colon cancer, without worrisome findings on  this examination (V16.0).   PLAN:  Repeat colonoscopy in five years for continued screening.       Bernette Redbird, M.D.    RB/MEDQ  D:  05/05/2004  T:  05/05/2004  Job:  [id number]   cc:   Margaretmary Bayley, M.D.  [address], [address]  [place]  [place] [address]  Fax: [phone]   Duke Salvia. Marcelle Overlie, M.D.  [address], [address] [place]  [place] [address]  Fax: [phone]

## 2011-01-21 NOTE — Procedures (Signed)
[facility]  Patient:    [name] [name], [name] [name]                       MRN: [id number] Proc. Date: 03/24/00 Adm. Date:  [id number] Disc. Date: [id number] Attending:  Rich Brave CC:         Duke Salvia. Marcelle Overlie, M.D.                           Procedure Report  PROCEDURE:  Upper endoscopy with biopsies.  ENDOSCOPIST:  Florencia Reasons, M.D.  ANESTHESIA:  INDICATIONS:  A 54 year old female with nonspecific chest pain, variably relieved by use of a PPI.  FINDINGS:  Normal exam.  DESCRIPTION OF PROCEDURE:  The nature, purpose, and risks of the procedure had been discussed with the patient who provided written consent.  Sedation was fentanyl 87.5 mcg and Versed 9 mg IV without arrhythmias or desaturation.  The Olympus video endoscope was passed under direct vision.  The vocal cords and larynx looked normal with the possible exception of some minimal thickening of the posterior commissure of the larynx.  The esophagus was easily entered and had a completely normal mucosa without any evidence of reflux esophagitis, Barretts esophagus, varices, infection, or neoplasia.  The squamocolumnar junction was at the level of the diaphragmatic hiatus without evidence of any hiatal hernia.  The stomach was entered.  It contained no residual and had normal mucosa without evidence of gastritis, erosions, ulcers, polyps, or masses including retroflexed view of the proximal stomach.  The pylorus, duodenal bulb, and the second duodenum looked normal.  Random duodenal and antral biopsies were obtained prior to removal of the scope.  The patient tolerated the procedure well and there were no apparent complications.  IMPRESSION:  Normal upper endoscopy.  No source of chest pain endoscopically evident.  PLAN:  Follow up in the office.  The patient does have a lot of stress in her life which may possibly be accounting for her chest pain. DD:  03/24/00 TD:   03/27/00 Job: [id number]

## 2011-02-01 ENCOUNTER — Other Ambulatory Visit (INDEPENDENT_AMBULATORY_CARE_PROVIDER_SITE_OTHER): Payer: Self-pay | Admitting: Surgery

## 2011-02-01 DIAGNOSIS — E041 Nontoxic single thyroid nodule: Secondary | ICD-10-CM

## 2011-02-02 ENCOUNTER — Ambulatory Visit
Admission: RE | Admit: 2011-02-02 | Discharge: 2011-02-02 | Disposition: A | Discharge status: Home / Self Care | Payer: Managed Care, Other (non HMO) | Source: Ambulatory Visit | Attending: Surgery | Admitting: Surgery

## 2011-02-02 DIAGNOSIS — E041 Nontoxic single thyroid nodule: Secondary | ICD-10-CM

## 2011-02-11 ENCOUNTER — Ambulatory Visit
Admission: RE | Admit: 2011-02-11 | Discharge: 2011-02-11 | Disposition: A | Discharge status: Home / Self Care | Payer: Managed Care, Other (non HMO) | Source: Ambulatory Visit | Attending: Obstetrics and Gynecology | Admitting: Obstetrics and Gynecology

## 2011-02-11 DIAGNOSIS — Z1231 Encounter for screening mammogram for malignant neoplasm of breast: Secondary | ICD-10-CM

## 2011-02-23 ENCOUNTER — Other Ambulatory Visit (INDEPENDENT_AMBULATORY_CARE_PROVIDER_SITE_OTHER): Payer: Self-pay | Admitting: Surgery

## 2011-02-23 DIAGNOSIS — E041 Nontoxic single thyroid nodule: Secondary | ICD-10-CM

## 2011-05-30 LAB — POCT I-STAT CREATININE
Creatinine, Ser: 0.9
Operator id: 288331

## 2011-05-30 LAB — POCT CARDIAC MARKERS
CKMB, poc: <1 — ABNORMAL LOW
Myoglobin, poc: 48.3
Operator id: 288331
Troponin i, poc: <0.05

## 2011-05-30 LAB — I-STAT 8, (EC8 V) (CONVERTED LAB)
Acid-base deficit: 1
BUN: 9
Bicarbonate: 24.6 — ABNORMAL HIGH
Chloride: 108
Glucose, Bld: 83
HCT: 43
Hemoglobin: 14.6
Operator id: 288331
Potassium: 4
Sodium: 141
TCO2: 26
pCO2, Ven: 42.1 — ABNORMAL LOW
pH, Ven: 7.375 — ABNORMAL HIGH

## 2011-05-30 LAB — LIPASE, BLOOD: Lipase: 31

## 2011-06-20 LAB — I-STAT 8, (EC8 V) (CONVERTED LAB)
BUN: 10
Bicarbonate: 25 — ABNORMAL HIGH
Chloride: 109
Glucose, Bld: 97
HCT: 40
Hemoglobin: 13.6
Operator id: 284251
Potassium: 4.5
Sodium: 141
TCO2: 26
pCO2, Ven: 43 — ABNORMAL LOW
pH, Ven: 7.373 — ABNORMAL HIGH

## 2011-06-20 LAB — POCT CARDIAC MARKERS
CKMB, poc: <1 — ABNORMAL LOW
Myoglobin, poc: 36.4
Operator id: 284251
Troponin i, poc: <0.05

## 2011-06-20 LAB — POCT I-STAT CREATININE
Creatinine, Ser: 0.9
Operator id: 284251

## 2011-07-04 ENCOUNTER — Other Ambulatory Visit: Payer: Self-pay | Admitting: Neurosurgery

## 2011-07-04 DIAGNOSIS — M545 Low back pain, unspecified: Secondary | ICD-10-CM

## 2011-07-04 DIAGNOSIS — M47817 Spondylosis without myelopathy or radiculopathy, lumbosacral region: Secondary | ICD-10-CM

## 2011-07-04 DIAGNOSIS — M5137 Other intervertebral disc degeneration, lumbosacral region: Secondary | ICD-10-CM

## 2011-07-04 DIAGNOSIS — M51379 Other intervertebral disc degeneration, lumbosacral region without mention of lumbar back pain or lower extremity pain: Secondary | ICD-10-CM

## 2011-07-08 DIAGNOSIS — R140 Abdominal distension (gaseous): Secondary | ICD-10-CM | POA: Insufficient documentation

## 2011-07-13 ENCOUNTER — Ambulatory Visit
Admission: RE | Admit: 2011-07-13 | Discharge: 2011-07-13 | Disposition: A | Discharge status: Home / Self Care | Payer: Managed Care, Other (non HMO) | Source: Ambulatory Visit | Attending: Neurosurgery | Admitting: Neurosurgery

## 2011-07-13 DIAGNOSIS — M545 Low back pain, unspecified: Secondary | ICD-10-CM

## 2011-07-13 DIAGNOSIS — M51379 Other intervertebral disc degeneration, lumbosacral region without mention of lumbar back pain or lower extremity pain: Secondary | ICD-10-CM

## 2011-07-13 DIAGNOSIS — M5137 Other intervertebral disc degeneration, lumbosacral region: Secondary | ICD-10-CM

## 2011-07-13 DIAGNOSIS — M47817 Spondylosis without myelopathy or radiculopathy, lumbosacral region: Secondary | ICD-10-CM

## 2011-08-09 ENCOUNTER — Other Ambulatory Visit: Payer: Self-pay | Admitting: Obstetrics and Gynecology

## 2011-08-09 DIAGNOSIS — N6459 Other signs and symptoms in breast: Secondary | ICD-10-CM

## 2011-08-17 ENCOUNTER — Other Ambulatory Visit: Payer: Self-pay | Admitting: Obstetrics and Gynecology

## 2011-08-17 ENCOUNTER — Ambulatory Visit
Admission: RE | Admit: 2011-08-17 | Discharge: 2011-08-17 | Disposition: A | Discharge status: Home / Self Care | Payer: Managed Care, Other (non HMO) | Source: Ambulatory Visit | Attending: Obstetrics and Gynecology | Admitting: Obstetrics and Gynecology

## 2011-08-17 DIAGNOSIS — N6459 Other signs and symptoms in breast: Secondary | ICD-10-CM

## 2011-10-23 ENCOUNTER — Emergency Department (HOSPITAL_COMMUNITY)
Admission: EM | Admit: 2011-10-23 | Discharge: 2011-10-23 | Disposition: A | Discharge status: Home / Self Care | Payer: Managed Care, Other (non HMO) | Attending: Emergency Medicine | Admitting: Emergency Medicine

## 2011-10-23 ENCOUNTER — Encounter (HOSPITAL_COMMUNITY): Payer: Self-pay | Admitting: *Deleted

## 2011-10-23 DIAGNOSIS — R1110 Vomiting, unspecified: Secondary | ICD-10-CM | POA: Insufficient documentation

## 2011-10-23 DIAGNOSIS — R197 Diarrhea, unspecified: Secondary | ICD-10-CM | POA: Insufficient documentation

## 2011-10-23 DIAGNOSIS — R10816 Epigastric abdominal tenderness: Secondary | ICD-10-CM | POA: Insufficient documentation

## 2011-10-23 DIAGNOSIS — Z9089 Acquired absence of other organs: Secondary | ICD-10-CM | POA: Insufficient documentation

## 2011-10-23 DIAGNOSIS — R109 Unspecified abdominal pain: Secondary | ICD-10-CM | POA: Insufficient documentation

## 2011-10-23 HISTORY — DX: Gastro-esophageal reflux disease without esophagitis: K21.9

## 2011-10-23 HISTORY — DX: Disorder of thyroid, unspecified: E07.9

## 2011-10-23 HISTORY — DX: Pure hypercholesterolemia, unspecified: E78.00

## 2011-10-23 LAB — DIFFERENTIAL
Basophils Absolute: 0 10*3/uL (ref 0.0–0.1)
Basophils Relative: 0 % (ref 0–1)
Eosinophils Absolute: 0.1 10*3/uL (ref 0.0–0.7)
Eosinophils Relative: 1 % (ref 0–5)
Lymphocytes Relative: 8 % — ABNORMAL LOW (ref 12–46)
Lymphs Abs: 0.9 10*3/uL (ref 0.7–4.0)
Monocytes Absolute: 0.6 10*3/uL (ref 0.1–1.0)
Monocytes Relative: 5 % (ref 3–12)
Neutro Abs: 9.5 10*3/uL — ABNORMAL HIGH (ref 1.7–7.7)
Neutrophils Relative %: 85 % — ABNORMAL HIGH (ref 43–77)

## 2011-10-23 LAB — URINALYSIS, ROUTINE W REFLEX MICROSCOPIC
Bilirubin Urine: NEGATIVE
Glucose, UA: NEGATIVE mg/dL
Hgb urine dipstick: NEGATIVE
Ketones, ur: NEGATIVE mg/dL
Leukocytes, UA: NEGATIVE
Nitrite: NEGATIVE
Protein, ur: NEGATIVE mg/dL
Specific Gravity, Urine: 1.022 (ref 1.005–1.030)
Urobilinogen, UA: 0.2 mg/dL (ref 0.0–1.0)
pH: 8 (ref 5.0–8.0)

## 2011-10-23 LAB — CBC
HCT: 41.1 % (ref 36.0–46.0)
Hemoglobin: 14.8 g/dL (ref 12.0–15.0)
MCH: 29.5 pg (ref 26.0–34.0)
MCHC: 36 g/dL (ref 30.0–36.0)
MCV: 82 fL (ref 78.0–100.0)
Platelets: 214 10*3/uL (ref 150–400)
RBC: 5.01 MIL/uL (ref 3.87–5.11)
RDW: 13 % (ref 11.5–15.5)
WBC: 11.2 10*3/uL — ABNORMAL HIGH (ref 4.0–10.5)

## 2011-10-23 LAB — COMPREHENSIVE METABOLIC PANEL
ALT: 25 U/L (ref 0–35)
AST: 27 U/L (ref 0–37)
Albumin: 4.4 g/dL (ref 3.5–5.2)
Alkaline Phosphatase: 68 U/L (ref 39–117)
BUN: 13 mg/dL (ref 6–23)
CO2: 25 mEq/L (ref 19–32)
Calcium: 9.6 mg/dL (ref 8.4–10.5)
Chloride: 103 mEq/L (ref 96–112)
Creatinine, Ser: 0.71 mg/dL (ref 0.50–1.10)
GFR calc Af Amer: >90 mL/min (ref >90)
GFR calc non Af Amer: >90 mL/min (ref >90)
Glucose, Bld: 106 mg/dL — ABNORMAL HIGH (ref 70–99)
Potassium: 3.9 mEq/L (ref 3.5–5.1)
Sodium: 137 mEq/L (ref 135–145)
Total Bilirubin: 1 mg/dL (ref 0.3–1.2)
Total Protein: 7.7 g/dL (ref 6.0–8.3)

## 2011-10-23 LAB — LIPASE, BLOOD: Lipase: 50 U/L (ref 11–59)

## 2011-10-23 MED ORDER — SODIUM CHLORIDE 0.9 % IV BOLUS (SEPSIS)
1000.0000 mL | Freq: Once | INTRAVENOUS | Status: AC
  Administered 2011-10-23: 1000 mL via INTRAVENOUS

## 2011-10-23 MED ORDER — SODIUM CHLORIDE 0.9 % IV SOLN
INTRAVENOUS | Status: DC
  Administered 2011-10-23: 09:00:00 via INTRAVENOUS

## 2011-10-23 MED ORDER — METOCLOPRAMIDE HCL 5 MG/ML IJ SOLN
10.0000 mg | Freq: Once | INTRAMUSCULAR | Status: AC
  Administered 2011-10-23: 10 mg via INTRAVENOUS
  Filled 2011-10-23: qty 2

## 2011-10-23 MED ORDER — FENTANYL CITRATE 0.05 MG/ML IJ SOLN
100.0000 ug | Freq: Once | INTRAMUSCULAR | Status: AC
  Administered 2011-10-23: 100 ug via INTRAVENOUS
  Filled 2011-10-23: qty 2

## 2011-10-23 MED ORDER — METOCLOPRAMIDE HCL 10 MG PO TABS: 10.0000 mg | ORAL_TABLET | Freq: Four times a day (QID) | ORAL | Status: AC | PRN

## 2011-10-23 MED ORDER — LOPERAMIDE HCL 2 MG PO CAPS
4.0000 mg | ORAL_CAPSULE | Freq: Once | ORAL | Status: AC
  Administered 2011-10-23: 4 mg via ORAL
  Filled 2011-10-23: qty 1

## 2011-10-23 MED ORDER — ONDANSETRON 8 MG PO TBDP: ORAL_TABLET | ORAL | Status: DC

## 2011-10-23 MED ORDER — ONDANSETRON HCL 4 MG/2ML IJ SOLN
4.0000 mg | Freq: Once | INTRAMUSCULAR | Status: AC
  Administered 2011-10-23: 4 mg via INTRAVENOUS
  Filled 2011-10-23: qty 2

## 2011-10-23 MED ORDER — LOPERAMIDE HCL 2 MG PO CAPS: ORAL_CAPSULE | ORAL | Status: AC

## 2011-10-23 NOTE — ED Provider Notes (Signed)
History     CSN: [id number]  Arrival date & time 10/23/11  0700   First MD Initiated Contact with Patient 10/23/11 [phone]      Chief Complaint  Patient presents with  . Abdominal Pain  . Nausea  . Diarrhea    (Consider location/radiation/quality/duration/timing/severity/associated sxs/prior treatment) HPI This 56 year old female with several hours of diffuse abdominal crampy pain but most of the pain is localized to the epigastrium it is moderate in severity radiation to the rest of the abdomen with nausea but no vomiting and no obvious bloody stools. She has had multiple diarrhea stools however. She has no chest pain cough shortness of breath lightheadedness myalgias rashes fevers recent travel or other concerns. There is no treatment prior to arrival. She has no recent ill contacts. She believes she still has her appendix but does not have any right lower quadrant localized abdominal pain. Past Medical History  Diagnosis Date  . Acid reflux   . Hypercholesteremia   . Thyroid disease     Past Surgical History  Procedure Date  . Abdominal hysterectomy   . Cholecystectomy     History reviewed. No pertinent family history.  History  Substance Use Topics  . Smoking status: Never Smoker   . Smokeless tobacco: Never Used  . Alcohol Use: Yes     occasionally    OB History    Grav Para Term Preterm Abortions TAB SAB Ect Mult Living                  Review of Systems  Constitutional: Negative for fever.       10 Systems reviewed and are negative for acute change except as noted in the HPI.  HENT: Negative for congestion.   Eyes: Negative for discharge and redness.  Respiratory: Negative for cough and shortness of breath.   Cardiovascular: Negative for chest pain.  Gastrointestinal: Positive for nausea, abdominal pain and diarrhea. Negative for vomiting and blood in stool.  Genitourinary: Negative for dysuria.  Musculoskeletal: Negative for back pain.  Skin: Negative  for rash.  Neurological: Negative for syncope, numbness and headaches.  Psychiatric/Behavioral:       No behavior change.    Allergies  Review of patient's allergies indicates no known allergies.  Home Medications   No current outpatient prescriptions on file.  BP 125/73  Pulse 78  Temp(Src) 98.3 F (36.8 C) (Oral)  Resp 18  SpO2 98%  Physical Exam  Nursing note and vitals reviewed. Constitutional:       Awake, alert, nontoxic appearance.  HENT:  Head: Atraumatic.  Eyes: Right eye exhibits no discharge. Left eye exhibits no discharge.  Neck: Neck supple.  Cardiovascular: Normal rate and regular rhythm.   No murmur heard. Pulmonary/Chest: Effort normal and breath sounds normal. No respiratory distress. She has no wheezes. She has no rales. She exhibits no tenderness.  Abdominal: Soft. Bowel sounds are normal. She exhibits no mass. There is tenderness. There is no rebound and no guarding.       The patient has minimal diffuse abdominal tenderness with mild to moderate epigastric tenderness without rebound  Musculoskeletal: She exhibits no edema and no tenderness.       Baseline ROM, no obvious new focal weakness.  Neurological: She is alert.       Mental status and motor strength appears baseline for patient and situation.  Skin: No rash noted.  Psychiatric: She has a normal mood and affect.    ED Course  Procedures (including  critical care time) The patient had both vomiting and diarrhea in the ED but feels somewhat better with IV fluids and treatment in the ED. Labs Reviewed  CBC - Abnormal; Notable for the following:    WBC 11.2 (*)    All other components within normal limits  DIFFERENTIAL - Abnormal; Notable for the following:    Neutrophils Relative 85 (*)    Neutro Abs 9.5 (*)    Lymphocytes Relative 8 (*)    All other components within normal limits  COMPREHENSIVE METABOLIC PANEL - Abnormal; Notable for the following:    Glucose, Bld 106 (*)    All other  components within normal limits  LIPASE, BLOOD  URINALYSIS, ROUTINE W REFLEX MICROSCOPIC  LAB REPORT - SCANNED   Ct Abdomen Pelvis W Contrast  10/24/2011  *RADIOLOGY REPORT*  Clinical Data: Epigastric pain.  CT ABDOMEN AND PELVIS WITH CONTRAST  Technique:  Multidetector CT imaging of the abdomen and pelvis was performed following the standard protocol during bolus administration of intravenous contrast.  Contrast: OMNIPAQUE IOHEXOL 300 MG/ML IV SOLN  Comparison: 09/20/2010.  Findings: Lung bases are clear.  Heart size normal.  No pericardial or pleural effusion.  Mild intrahepatic and extrahepatic biliary duct dilatation appears stable after cholecystectomy.  Liver and adrenal glands are otherwise unremarkable.  Low attenuation lesions in the kidneys measure up to 1.8 cm on the right, as before. Spleen is unremarkable.  Mild prominence of the pancreatic duct is seen in the head region, as before.  Pancreas is uniform in attenuation.  No surrounding inflammatory changes.  The stomach and bowel are unremarkable.  There are scattered surgical clips in the abdomen.  Trace pelvic free fluid.  No pathologically enlarged lymph nodes.  No worrisome lytic or sclerotic lesions.  IMPRESSION:  1.  No findings to explain the patient's given symptoms. 2.  Trace pelvic free fluid.  Original Report Authenticated By: Reyes Ivan, M.D.     1. Abdominal pain   2. Vomiting   3. Diarrhea       MDM  I doubt any other EMC precluding discharge at this time including, but not necessarily limited to the following:SBI, peritonitis.        Hurman Horn, MD 10/24/11 2220

## 2011-10-23 NOTE — ED Notes (Signed)
Pt c/o waking from sleep w/ abdominal pain and diarrhea. Pt c/o nausea, denies vomiting at this time.

## 2011-10-23 NOTE — ED Notes (Signed)
Patient given discharge instructions, information, prescriptions, and diet order. Patient states that they adequately understand discharge information given and to return to ED if symptoms return or worsen.     

## 2011-10-24 ENCOUNTER — Other Ambulatory Visit: Payer: Self-pay

## 2011-10-24 ENCOUNTER — Encounter (HOSPITAL_COMMUNITY): Payer: Self-pay | Admitting: *Deleted

## 2011-10-24 ENCOUNTER — Emergency Department (HOSPITAL_COMMUNITY): Payer: Managed Care, Other (non HMO)

## 2011-10-24 ENCOUNTER — Inpatient Hospital Stay (HOSPITAL_COMMUNITY)
Admission: EM | Admit: 2011-10-24 | Discharge: 2011-10-26 | DRG: 440 | Disposition: A | Discharge status: Home / Self Care | Payer: Managed Care, Other (non HMO) | Attending: Internal Medicine | Admitting: Internal Medicine

## 2011-10-24 DIAGNOSIS — E876 Hypokalemia: Secondary | ICD-10-CM | POA: Diagnosis present

## 2011-10-24 DIAGNOSIS — E041 Nontoxic single thyroid nodule: Secondary | ICD-10-CM | POA: Diagnosis present

## 2011-10-24 DIAGNOSIS — R112 Nausea with vomiting, unspecified: Secondary | ICD-10-CM | POA: Diagnosis present

## 2011-10-24 DIAGNOSIS — R7989 Other specified abnormal findings of blood chemistry: Secondary | ICD-10-CM | POA: Diagnosis present

## 2011-10-24 DIAGNOSIS — R748 Abnormal levels of other serum enzymes: Secondary | ICD-10-CM | POA: Diagnosis present

## 2011-10-24 DIAGNOSIS — E860 Dehydration: Secondary | ICD-10-CM | POA: Diagnosis present

## 2011-10-24 DIAGNOSIS — K5289 Other specified noninfective gastroenteritis and colitis: Secondary | ICD-10-CM | POA: Diagnosis present

## 2011-10-24 DIAGNOSIS — K529 Noninfective gastroenteritis and colitis, unspecified: Secondary | ICD-10-CM | POA: Diagnosis present

## 2011-10-24 DIAGNOSIS — R1013 Epigastric pain: Secondary | ICD-10-CM | POA: Diagnosis present

## 2011-10-24 DIAGNOSIS — K219 Gastro-esophageal reflux disease without esophagitis: Secondary | ICD-10-CM | POA: Diagnosis present

## 2011-10-24 DIAGNOSIS — K859 Acute pancreatitis without necrosis or infection, unspecified: Principal | ICD-10-CM | POA: Diagnosis present

## 2011-10-24 DIAGNOSIS — E7800 Pure hypercholesterolemia, unspecified: Secondary | ICD-10-CM | POA: Diagnosis present

## 2011-10-24 LAB — COMPREHENSIVE METABOLIC PANEL
ALT: 180 U/L — ABNORMAL HIGH (ref 0–35)
AST: 348 U/L — ABNORMAL HIGH (ref 0–37)
Albumin: 3.8 g/dL (ref 3.5–5.2)
Alkaline Phosphatase: 96 U/L (ref 39–117)
BUN: 9 mg/dL (ref 6–23)
CO2: 23 mEq/L (ref 19–32)
Calcium: 8.7 mg/dL (ref 8.4–10.5)
Chloride: 103 mEq/L (ref 96–112)
Creatinine, Ser: 0.73 mg/dL (ref 0.50–1.10)
GFR calc Af Amer: >90 mL/min (ref >90)
GFR calc non Af Amer: >90 mL/min (ref >90)
Glucose, Bld: 121 mg/dL — ABNORMAL HIGH (ref 70–99)
Potassium: 3.5 mEq/L (ref 3.5–5.1)
Sodium: 136 mEq/L (ref 135–145)
Total Bilirubin: 1.8 mg/dL — ABNORMAL HIGH (ref 0.3–1.2)
Total Protein: 6.7 g/dL (ref 6.0–8.3)

## 2011-10-24 LAB — CARDIAC PANEL(CRET KIN+CKTOT+MB+TROPI)
CK, MB: 1.7 ng/mL (ref 0.3–4.0)
CK, MB: 1.8 ng/mL (ref 0.3–4.0)
Relative Index: INVALID (ref 0.0–2.5)
Relative Index: INVALID (ref 0.0–2.5)
Total CK: 59 U/L (ref 7–177)
Total CK: 65 U/L (ref 7–177)
Troponin I: <0.3 ng/mL (ref <0.30)
Troponin I: <0.3 ng/mL (ref <0.30)

## 2011-10-24 LAB — DIFFERENTIAL
Basophils Absolute: 0 10*3/uL (ref 0.0–0.1)
Basophils Relative: 0 % (ref 0–1)
Eosinophils Absolute: 0 10*3/uL (ref 0.0–0.7)
Eosinophils Relative: 0 % (ref 0–5)
Lymphocytes Relative: 22 % (ref 12–46)
Lymphs Abs: 1 10*3/uL (ref 0.7–4.0)
Monocytes Absolute: 0.3 10*3/uL (ref 0.1–1.0)
Monocytes Relative: 8 % (ref 3–12)
Neutro Abs: 3.1 10*3/uL (ref 1.7–7.7)
Neutrophils Relative %: 70 % (ref 43–77)

## 2011-10-24 LAB — CBC
HCT: 38.9 % (ref 36.0–46.0)
HCT: 34.2 % — ABNORMAL LOW (ref 36.0–46.0)
Hemoglobin: 13.7 g/dL (ref 12.0–15.0)
Hemoglobin: 11.9 g/dL — ABNORMAL LOW (ref 12.0–15.0)
MCH: 28.8 pg (ref 26.0–34.0)
MCH: 28.5 pg (ref 26.0–34.0)
MCHC: 35.2 g/dL (ref 30.0–36.0)
MCHC: 34.8 g/dL (ref 30.0–36.0)
MCV: 81.9 fL (ref 78.0–100.0)
MCV: 81.8 fL (ref 78.0–100.0)
Platelets: 186 10*3/uL (ref 150–400)
Platelets: 166 10*3/uL (ref 150–400)
RBC: 4.75 MIL/uL (ref 3.87–5.11)
RBC: 4.18 MIL/uL (ref 3.87–5.11)
RDW: 12.8 % (ref 11.5–15.5)
RDW: 13 % (ref 11.5–15.5)
WBC: 8.7 10*3/uL (ref 4.0–10.5)
WBC: 4.4 10*3/uL (ref 4.0–10.5)

## 2011-10-24 LAB — POCT I-STAT TROPONIN I: Troponin i, poc: 0 ng/mL (ref 0.00–0.08)

## 2011-10-24 LAB — PREGNANCY, URINE: Preg Test, Ur: NEGATIVE

## 2011-10-24 LAB — LIPASE, BLOOD: Lipase: 131 U/L — ABNORMAL HIGH (ref 11–59)

## 2011-10-24 LAB — MAGNESIUM: Magnesium: 1.9 mg/dL (ref 1.5–2.5)

## 2011-10-24 MED ORDER — SODIUM CHLORIDE 0.9 % IV SOLN
INTRAVENOUS | Status: DC
  Administered 2011-10-24: 16:00:00 via INTRAVENOUS
  Administered 2011-10-25: 100 mL/h via INTRAVENOUS

## 2011-10-24 MED ORDER — ONDANSETRON HCL 4 MG/2ML IJ SOLN
4.0000 mg | Freq: Once | INTRAMUSCULAR | Status: AC
  Administered 2011-10-24: 4 mg via INTRAVENOUS
  Filled 2011-10-24: qty 2

## 2011-10-24 MED ORDER — HYDROMORPHONE HCL PF 1 MG/ML IJ SOLN
INTRAMUSCULAR | Status: AC
  Filled 2011-10-24: qty 1

## 2011-10-24 MED ORDER — IOHEXOL 300 MG/ML  SOLN
100.0000 mL | Freq: Once | INTRAMUSCULAR | Status: AC | PRN
  Administered 2011-10-24: 100 mL via INTRAVENOUS

## 2011-10-24 MED ORDER — METOCLOPRAMIDE HCL 10 MG PO TABS: 10.0000 mg | ORAL_TABLET | Freq: Once | ORAL | Status: DC

## 2011-10-24 MED ORDER — HYDROMORPHONE HCL PF 1 MG/ML IJ SOLN
1.0000 mg | Freq: Once | INTRAMUSCULAR | Status: AC
  Administered 2011-10-24: 1 mg via INTRAVENOUS
  Filled 2011-10-24: qty 1

## 2011-10-24 MED ORDER — PANTOPRAZOLE SODIUM 40 MG IV SOLR
40.0000 mg | INTRAVENOUS | Status: DC
  Administered 2011-10-24: 40 mg via INTRAVENOUS
  Filled 2011-10-24 (×2): qty 40

## 2011-10-24 MED ORDER — METOCLOPRAMIDE HCL 5 MG/ML IJ SOLN
10.0000 mg | Freq: Four times a day (QID) | INTRAMUSCULAR | Status: DC | PRN
  Filled 2011-10-24: qty 2

## 2011-10-24 MED ORDER — PANTOPRAZOLE SODIUM 40 MG IV SOLR
40.0000 mg | Freq: Once | INTRAVENOUS | Status: AC
  Administered 2011-10-24: 40 mg via INTRAVENOUS
  Filled 2011-10-24: qty 40

## 2011-10-24 MED ORDER — SODIUM CHLORIDE 0.9 % IV SOLN
INTRAVENOUS | Status: DC
  Administered 2011-10-24: 1000 mL via INTRAVENOUS

## 2011-10-24 MED ORDER — HYDROMORPHONE HCL PF 1 MG/ML IJ SOLN: 1.0000 mg | INTRAMUSCULAR | Status: DC | PRN

## 2011-10-24 MED ORDER — GI COCKTAIL ~~LOC~~
30.0000 mL | Freq: Once | ORAL | Status: AC
  Administered 2011-10-24: 30 mL via ORAL
  Filled 2011-10-24: qty 30

## 2011-10-24 NOTE — ED Provider Notes (Signed)
History     CSN: [id number]  Arrival date & time 10/24/11  4098   First MD Initiated Contact with Patient 10/24/11 0550      Chief Complaint  Patient presents with  . Abdominal Pain    (Consider location/radiation/quality/duration/timing/severity/associated sxs/prior treatment) HPI Epigastric pain. No radiation. Quality described as "feels like a knot". No associated heartburn or belching. She has previous history of cholecystectomy. No history of pancreatitis she denies any alcohol use. No trauma. No new medications. She was evaluated yesterday for nausea vomiting and diarrhea and discharged home with prescription for arrival in and Imodium which she has not started yet. No fevers or chills. Some nausea today but no persistent vomiting or diarrhea. No blood in stools or emesis. Symptoms moderate severity. Pain constant since onset about 24 hours ago.  Past Medical History  Diagnosis Date  . Acid reflux   . Hypercholesteremia   . Thyroid disease     Past Surgical History  Procedure Date  . Abdominal hysterectomy   . Cholecystectomy     No family history on file.  History  Substance Use Topics  . Smoking status: Never Smoker   . Smokeless tobacco: Not on file  . Alcohol Use: Yes     occasionally    OB History    Grav Para Term Preterm Abortions TAB SAB Ect Mult Living                  Review of Systems  Constitutional: Negative for fever and chills.  HENT: Negative for neck pain and neck stiffness.   Eyes: Negative for pain.  Respiratory: Negative for shortness of breath.   Cardiovascular: Negative for chest pain.  Gastrointestinal: Positive for abdominal pain.  Genitourinary: Negative for dysuria.  Musculoskeletal: Negative for back pain.  Skin: Negative for rash.  Neurological: Negative for headaches.  All other systems reviewed and are negative.    Allergies  Review of patient's allergies indicates no known allergies.  Home Medications   Current  Outpatient Rx  Name Route Sig Dispense Refill  . OMEPRAZOLE 40 MG PO CPDR Oral Take 40 mg by mouth daily.    Marland Kitchen LOPERAMIDE HCL 2 MG PO CAPS  Take two tabs po initially, then one tab after each loose stool: max 8 tabs in 24 hours 12 capsule 0  . METOCLOPRAMIDE HCL 10 MG PO TABS Oral Take 1 tablet (10 mg total) by mouth every 6 (six) hours as needed (nausea/headache). 6 tablet 0    BP 117/80  Pulse 86  Temp(Src) 97.7 F (36.5 C) (Oral)  Resp 20  SpO2 100%  Physical Exam  Constitutional: She is oriented to person, place, and time. She appears well-developed and well-nourished.  HENT:  Head: Normocephalic and atraumatic.  Eyes: Conjunctivae and EOM are normal. Pupils are equal, round, and reactive to light.  Neck: Trachea normal. Neck supple. No thyromegaly present.  Cardiovascular: Normal rate, regular rhythm, S1 normal, S2 normal and normal pulses.     No systolic murmur is present   No diastolic murmur is present  Pulses:      Radial pulses are 2+ on the right side, and 2+ on the left side.  Pulmonary/Chest: Effort normal and breath sounds normal. She has no wheezes. She has no rhonchi. She has no rales. She exhibits no tenderness.  Abdominal: Soft. Normal appearance and bowel sounds are normal. She exhibits no mass. There is no rebound, no guarding, no CVA tenderness and negative Murphy's sign.  Tender to palpation epigastric and left upper quadrant.  Musculoskeletal:       BLE:s Calves nontender, no cords or erythema, negative Homans sign  Neurological: She is alert and oriented to person, place, and time. She has normal strength. No cranial nerve deficit or sensory deficit. GCS eye subscore is 4. GCS verbal subscore is 5. GCS motor subscore is 6.  Skin: Skin is warm and dry. No rash noted. She is not diaphoretic.  Psychiatric: Her speech is normal.       Cooperative and appropriate    ED Course  Procedures (including critical care time)  Results for orders placed during  the hospital encounter of 10/24/11  CBC      Component Value Range   WBC 8.7  4.0 - 10.5 (K/uL)   RBC 4.75  3.87 - 5.11 (MIL/uL)   Hemoglobin 13.7  12.0 - 15.0 (g/dL)   HCT 16.1  09.6 - 04.5 (%)   MCV 81.9  78.0 - 100.0 (fL)   MCH 28.8  26.0 - 34.0 (pg)   MCHC 35.2  30.0 - 36.0 (g/dL)   RDW 40.9  81.1 - 91.4 (%)   Platelets 186  150 - 400 (K/uL)  COMPREHENSIVE METABOLIC PANEL      Component Value Range   Sodium 136  135 - 145 (mEq/L)   Potassium 3.5  3.5 - 5.1 (mEq/L)   Chloride 103  96 - 112 (mEq/L)   CO2 23  19 - 32 (mEq/L)   Glucose, Bld 121 (*) 70 - 99 (mg/dL)   BUN 9  6 - 23 (mg/dL)   Creatinine, Ser 7.82  0.50 - 1.10 (mg/dL)   Calcium 8.7  8.4 - 95.6 (mg/dL)   Total Protein 6.7  6.0 - 8.3 (g/dL)   Albumin 3.8  3.5 - 5.2 (g/dL)   AST 213 (*) 0 - 37 (U/L)   ALT 180 (*) 0 - 35 (U/L)   Alkaline Phosphatase 96  39 - 117 (U/L)   Total Bilirubin 1.8 (*) 0.3 - 1.2 (mg/dL)   GFR calc non Af Amer >90  >90 (mL/min)   GFR calc Af Amer >90  >90 (mL/min)  LIPASE, BLOOD      Component Value Range   Lipase 131 (*) 11 - 59 (U/L)  PREGNANCY, URINE      Component Value Range   Preg Test, Ur NEGATIVE  NEGATIVE   POCT I-STAT TROPONIN I      Component Value Range   Troponin i, poc 0.00  0.00 - 0.08 (ng/mL)   Comment 3             Date: 10/24/2011  Rate: 68  Rhythm: normal sinus rhythm  QRS Axis: normal  Intervals: normal  ST/T Wave abnormalities: nonspecific ST changes  Conduction Disutrbances:none  Narrative Interpretation:   Old EKG Reviewed: unchanged  IV fluids.  MDM   Epigastric pain with elevated lipase and LFTs status post cholecystectomy. IV Dilaudid pain control. CT scans ordered and medicine consultation requested for admission.         Sunnie Nielsen, MD 10/24/11 [phone]

## 2011-10-24 NOTE — ED Notes (Signed)
MD at bedside. 

## 2011-10-24 NOTE — H&P (Signed)
PCP:  Laurena Slimmer, MD, MD Chief Complaint:  Abdominal pain of 1 days duration.  HPI:  Patient is a 56 year old African American female with history of thyroid nodule,  GERD presenting to the emergency room with one-day history of abdominal pain which started suddenly. Pain was described as achy, 8/10 in intensity, located in the epigastric region and radiating to the back. This is said to be associated with multiple episode of nausea and vomiting. The vomiting was said to be nonprojectile and nonbloody. Patient denied any history of diarrhea or hematochezia. She denies any history of chest pain or shortness of breath. She complain of occasional chest discomfort but denied any shortness of breath. She denies any fever, chills or Rigors. Patient was initially seen in the emergency room yesterday which is 10/23/2011 and treated as outpatient, however pain was said to have persisted and she presented today for the second time. After evaluation, patient was advised to be admitted for stabilization.  Review of Systems:  The patient denies anorexia, fever, weight loss,, vision loss, decreased hearing, hoarseness, chest pain, syncope, dyspnea on exertion, peripheral edema, balance deficits, hemoptysis, abdominal pain+, melena, hematochezia, severe indigestion/heartburn, hematuria, incontinence, genital sores, muscle weakness, suspicious skin lesions, transient blindness, difficulty walking, depression, unusual weight change, abnormal bleeding, enlarged lymph nodes, angioedema, and breast masses.  Past Medical History:  Past Medical History  Diagnosis Date  . Acid reflux   . Hypercholesteremia   . Thyroid disease     Past Surgical History  Procedure Date  . Abdominal hysterectomy   . Cholecystectomy     Medications:  Prior to Admission medications   Medication Sig Start Date End Date Taking? Authorizing Provider  omeprazole (PRILOSEC) 40 MG capsule Take 40 mg by mouth daily.   Yes  Historical Provider, MD  loperamide (IMODIUM) 2 MG capsule Take two tabs po initially, then one tab after each loose stool: max 8 tabs in 24 hours 10/23/11 11/02/11  Hurman Horn, MD  metoCLOPramide (REGLAN) 10 MG tablet Take 1 tablet (10 mg total) by mouth every 6 (six) hours as needed (nausea/headache). 10/23/11 11/02/11  Hurman Horn, MD    Allergies:  No Known Allergies  Social History:   reports that she has never smoked. She has never used smokeless tobacco. She reports that she drinks alcohol. She reports that she does not use illicit drugs.  Family History:  History reviewed. No pertinent family history.  Physical Exam:  Filed Vitals:   10/24/11 0303 10/24/11 0726 10/24/11 0934 10/24/11 1025  BP: 117/80 115/66 109/70 120/82  Pulse: 86 80 66 63  Temp: 97.7 F (36.5 C)   98 F (36.7 C)  TempSrc: Oral   Oral  Resp: 20   18  SpO2: 100% 100% 100% 98%      General: Alert and oriented times three, well developed and nourished, not acute distress, dehydrated  Eyes: PERRLA, pink conjunctiva, scleral icterus  ENT: Dry oral mucosa, neck supple, no thyromegaly  Lungs: clear to ascultation, no wheeze, no crackles, no use of accessory muscles  Cardiovascular: regular rate and rhythm, no regurgitation, no gallops, no murmurs. No carotid bruits, no JVD  Abdomen: soft, mild epigastric tenderness, no guarding no rigidity, no organomegaly, bowel sounds are hypoactive  GU: not examined  Neuro: No focal  Musculoskeletal: strength 5/5 all extremities, no clubbing, cyanosis or edema  Skin: Decreased turgor  Psych: appropriate affect  ?  Labs on Admission:   [facility] 10/24/11 0440 10/23/11 0725  NA 136 137  K 3.5 3.9  CL 103 103  CO2 23 25  GLUCOSE 121* 106*  BUN 9 13  CREATININE 0.73 0.71  CALCIUM 8.7 9.6  MG -- --  PHOS -- --     Basename 10/24/11 0440 10/23/11 0725  AST 348* 27  ALT 180* 25  ALKPHOS 96 68  BILITOT 1.8* 1.0  PROT 6.7 7.7  ALBUMIN 3.8  4.4     Basename 10/24/11 0440 10/23/11 0725  LIPASE 131* 50  AMYLASE -- --     Basename 10/24/11 0440 10/23/11 0725  WBC 8.7 11.2*  NEUTROABS -- 9.5*  HGB 13.7 14.8  HCT 38.9 41.1  MCV 81.9 82.0  PLT 186 214    No results found for this basename: CKTOTAL:3,CKMB:3,CKMBINDEX:3,TROPONINI:3 in the last 72 hours  No results found for this basename: TSH,T4TOTAL,FREET3,T3FREE,THYROIDAB in the last 72 hours  No results found for this basename: VITAMINB12:2,FOLATE:2,FERRITIN:2,TIBC:2,IRON:2,RETICCTPCT:2 in the last 72 hours  Radiological Exams on Admission:  Ct Abdomen Pelvis W Contrast  10/24/2011  *RADIOLOGY REPORT*  Clinical Data: Epigastric pain.  CT ABDOMEN AND PELVIS WITH CONTRAST  Technique:  Multidetector CT imaging of the abdomen and pelvis was performed following the standard protocol during bolus administration of intravenous contrast.  Contrast: OMNIPAQUE IOHEXOL 300 MG/ML IV SOLN  Comparison: 09/20/2010.  Findings: Lung bases are clear.  Heart size normal.  No pericardial or pleural effusion.  Mild intrahepatic and extrahepatic biliary duct dilatation appears stable after cholecystectomy.  Liver and adrenal glands are otherwise unremarkable.  Low attenuation lesions in the kidneys measure up to 1.8 cm on the right, as before. Spleen is unremarkable.  Mild prominence of the pancreatic duct is seen in the head region, as before.  Pancreas is uniform in attenuation.  No surrounding inflammatory changes.  The stomach and bowel are unremarkable.  There are scattered surgical clips in the abdomen.  Trace pelvic free fluid.  No pathologically enlarged lymph nodes.  No worrisome lytic or sclerotic lesions.  IMPRESSION:  1.  No findings to explain the patient's given symptoms. 2.  Trace pelvic free fluid.  Original Report Authenticated By: Reyes Ivan, M.D.    Assessment/Plan  Present on Admission:   Problems: #1 abdominal pain-epigastric #2 nausea and vomiting #3  elevated lipase  Impression: #1 acute pancreatitis #2 dehydration #3 GERD #4 history of thyroid nodule #5 history of hyperlipidemia.  Plan: #1 admit patient to general medical floor #2 n.p.o. for now #3 pain control with dilaudid #4 GI prophylaxis with Protonix and DVT prophylaxis with TED hose #5 labs; Lipid panel, because of history of occasional chest discomfort and hyperlipidemia, will order cardiac enzyme every 8 apex 3 repeat lipase in a.m., CBC CMP and magnesium was to be repeated in a.m. Patient will be evaluated daily          Talmage Nap                [phone]

## 2011-10-24 NOTE — ED Notes (Signed)
Pt presents with abdominal pain with no current N/V/D, though she did have N/V/D upon her earlier visit yesterday.  Pt states that she feels as if "something is balled up" in her epigastric region.

## 2011-10-24 NOTE — Progress Notes (Signed)
CARE MANAGEMENT NOTE 10/24/2011  Patient:  [name] [name], [name] [name]   Account Number:  [id number]  Date Initiated:  10/24/2011  Documentation initiated by:  Kham Zuckerman  Subjective/Objective Assessment:   pt with abd pain admitted as observation     Action/Plan:   lives at home   Anticipated DC Date:  10/25/2011   Anticipated DC Plan:  HOME/SELF CARE  In-house referral  NA      DC Planning Services  NA      [facility] Choice  NA   Choice offered to / List presented to:  NA   DME arranged  NA      DME agency  NA     HH arranged  NA      HH agency  NA   Status of service:  In process, will continue to follow Medicare Important Message given?  NA - LOS <3 / Initial given by admissions (If response is "NO", the following Medicare IM given date fields will be blank) Date Medicare IM given:   Date Additional Medicare IM given:    Discharge Disposition:    Per UR Regulation:  Reviewed for med. necessity/level of care/duration of stay  Comments:  [id number]/Theoren Palka,RN,BSN,CCM

## 2011-10-25 LAB — CBC
HCT: 36.2 % (ref 36.0–46.0)
Hemoglobin: 12.6 g/dL (ref 12.0–15.0)
MCH: 28.6 pg (ref 26.0–34.0)
MCHC: 34.8 g/dL (ref 30.0–36.0)
MCV: 82.1 fL (ref 78.0–100.0)
Platelets: 166 10*3/uL (ref 150–400)
RBC: 4.41 MIL/uL (ref 3.87–5.11)
RDW: 12.9 % (ref 11.5–15.5)
WBC: 5.5 10*3/uL (ref 4.0–10.5)

## 2011-10-25 LAB — DIFFERENTIAL
Basophils Absolute: 0 10*3/uL (ref 0.0–0.1)
Basophils Relative: 0 % (ref 0–1)
Eosinophils Absolute: 0.1 10*3/uL (ref 0.0–0.7)
Eosinophils Relative: 2 % (ref 0–5)
Lymphocytes Relative: 24 % (ref 12–46)
Lymphs Abs: 1.3 10*3/uL (ref 0.7–4.0)
Monocytes Absolute: 0.5 10*3/uL (ref 0.1–1.0)
Monocytes Relative: 8 % (ref 3–12)
Neutro Abs: 3.6 10*3/uL (ref 1.7–7.7)
Neutrophils Relative %: 66 % (ref 43–77)

## 2011-10-25 LAB — LIPID PANEL
Cholesterol: 144 mg/dL (ref 0–200)
HDL: 62 mg/dL (ref >39)
LDL Cholesterol: 72 mg/dL (ref 0–99)
Total CHOL/HDL Ratio: 2.3 RATIO
Triglycerides: 49 mg/dL (ref <150)
VLDL: 10 mg/dL (ref 0–40)

## 2011-10-25 LAB — LIPASE, BLOOD: Lipase: 32 U/L (ref 11–59)

## 2011-10-25 LAB — CARDIAC PANEL(CRET KIN+CKTOT+MB+TROPI)
CK, MB: 1.8 ng/mL (ref 0.3–4.0)
Relative Index: INVALID (ref 0.0–2.5)
Total CK: 63 U/L (ref 7–177)
Troponin I: <0.3 ng/mL (ref <0.30)

## 2011-10-25 MED ORDER — PANTOPRAZOLE SODIUM 40 MG PO TBEC
40.0000 mg | DELAYED_RELEASE_TABLET | Freq: Two times a day (BID) | ORAL | Status: DC
  Administered 2011-10-25 – 2011-10-26 (×2): 40 mg via ORAL
  Filled 2011-10-25 (×3): qty 1

## 2011-10-25 NOTE — Progress Notes (Signed)
Patient ID: [name] [name], female   DOB: 05-Jan-1956, 56 y.o.   MRN: [id number] Subjective: Patient seen. Feels better. Denies any abdominal pain. No nausea or vomiting.  Objective: Weight change:   Intake/Output Summary (Last 24 hours) at 10/25/11 1248 Last data filed at 10/25/11 0500  Gross per 24 hour  Intake   1250 ml  Output   1450 ml  Net   -200 ml   BP 98/60  Pulse 77  Temp(Src) 97.9 F (36.6 C) (Oral)  Resp 18  Ht 5' 6.5" (1.689 m)  Wt 86.183 kg (190 lb)  BMI 30.21 kg/m2  SpO2 98% Physical Exam: General appearance: alert, cooperative and no distress Head: Normocephalic, without obvious abnormality, atraumatic Neck: no adenopathy, no carotid bruit, no JVD, supple, symmetrical, trachea midline and thyroid not enlarged, symmetric, no tenderness/mass/nodules Lungs: clear to auscultation bilaterally Heart: regular rate and rhythm, S1, S2 normal, no murmur, click, rub or gallop Abdomen: soft, non-tender; bowel sounds normal; no masses,  no organomegaly Extremities: extremities normal, atraumatic, no cyanosis or edema Skin: Skin color, texture, turgor normal. No rashes or lesions  Lab Results: Results for orders placed during the hospital encounter of 10/24/11 (from the past 48 hour(s))  CBC     Status: Normal   Collection Time   10/24/11  4:40 AM      Component Value Range Comment   WBC 8.7  4.0 - 10.5 (K/uL)    RBC 4.75  3.87 - 5.11 (MIL/uL)    Hemoglobin 13.7  12.0 - 15.0 (g/dL)    HCT 40.9  81.1 - 91.4 (%)    MCV 81.9  78.0 - 100.0 (fL)    MCH 28.8  26.0 - 34.0 (pg)    MCHC 35.2  30.0 - 36.0 (g/dL)    RDW 78.2  95.6 - 21.3 (%)    Platelets 186  150 - 400 (K/uL)   COMPREHENSIVE METABOLIC PANEL     Status: Abnormal   Collection Time   10/24/11  4:40 AM      Component Value Range Comment   Sodium 136  135 - 145 (mEq/L)    Potassium 3.5  3.5 - 5.1 (mEq/L)    Chloride 103  96 - 112 (mEq/L)    CO2 23  19 - 32 (mEq/L)    Glucose, Bld 121 (*) 70 - 99 (mg/dL)    BUN  9  6 - 23 (mg/dL)    Creatinine, Ser 0.86  0.50 - 1.10 (mg/dL)    Calcium 8.7  8.4 - 10.5 (mg/dL)    Total Protein 6.7  6.0 - 8.3 (g/dL)    Albumin 3.8  3.5 - 5.2 (g/dL)    AST 578 (*) 0 - 37 (U/L)    ALT 180 (*) 0 - 35 (U/L)    Alkaline Phosphatase 96  39 - 117 (U/L)    Total Bilirubin 1.8 (*) 0.3 - 1.2 (mg/dL)    GFR calc non Af Amer >90  >90 (mL/min)    GFR calc Af Amer >90  >90 (mL/min)   LIPASE, BLOOD     Status: Abnormal   Collection Time   10/24/11  4:40 AM      Component Value Range Comment   Lipase 131 (*) 11 - 59 (U/L)   PREGNANCY, URINE     Status: Normal   Collection Time   10/24/11  4:46 AM      Component Value Range Comment   Preg Test, Ur NEGATIVE  NEGATIVE  POCT I-STAT TROPONIN I     Status: Normal   Collection Time   10/24/11  6:29 AM      Component Value Range Comment   Troponin i, poc 0.00  0.00 - 0.08 (ng/mL)    Comment 3            CARDIAC PANEL(CRET KIN+CKTOT+MB+TROPI)     Status: Normal   Collection Time   10/24/11  1:35 PM      Component Value Range Comment   Total CK 59  7 - 177 (U/L)    CK, MB 1.7  0.3 - 4.0 (ng/mL)    Troponin I <0.30  <0.30 (ng/mL)    Relative Index RELATIVE INDEX IS INVALID  0.0 - 2.5    MAGNESIUM     Status: Normal   Collection Time   10/24/11  1:35 PM      Component Value Range Comment   Magnesium 1.9  1.5 - 2.5 (mg/dL)   CBC     Status: Abnormal   Collection Time   10/24/11  1:35 PM      Component Value Range Comment   WBC 4.4  4.0 - 10.5 (K/uL)    RBC 4.18  3.87 - 5.11 (MIL/uL)    Hemoglobin 11.9 (*) 12.0 - 15.0 (g/dL)    HCT 16.1 (*) 09.6 - 46.0 (%)    MCV 81.8  78.0 - 100.0 (fL)    MCH 28.5  26.0 - 34.0 (pg)    MCHC 34.8  30.0 - 36.0 (g/dL)    RDW 04.5  40.9 - 81.1 (%)    Platelets 166  150 - 400 (K/uL)   DIFFERENTIAL     Status: Normal   Collection Time   10/24/11  1:35 PM      Component Value Range Comment   Neutrophils Relative 70  43 - 77 (%)    Neutro Abs 3.1  1.7 - 7.7 (K/uL)    Lymphocytes Relative 22  12  - 46 (%)    Lymphs Abs 1.0  0.7 - 4.0 (K/uL)    Monocytes Relative 8  3 - 12 (%)    Monocytes Absolute 0.3  0.1 - 1.0 (K/uL)    Eosinophils Relative 0  0 - 5 (%)    Eosinophils Absolute 0.0  0.0 - 0.7 (K/uL)    Basophils Relative 0  0 - 1 (%)    Basophils Absolute 0.0  0.0 - 0.1 (K/uL)   CARDIAC PANEL(CRET KIN+CKTOT+MB+TROPI)     Status: Normal   Collection Time   10/24/11  8:30 PM      Component Value Range Comment   Total CK 65  7 - 177 (U/L)    CK, MB 1.8  0.3 - 4.0 (ng/mL)    Troponin I <0.30  <0.30 (ng/mL)    Relative Index RELATIVE INDEX IS INVALID  0.0 - 2.5    CARDIAC PANEL(CRET KIN+CKTOT+MB+TROPI)     Status: Normal   Collection Time   10/25/11  3:30 AM      Component Value Range Comment   Total CK 63  7 - 177 (U/L)    CK, MB 1.8  0.3 - 4.0 (ng/mL)    Troponin I <0.30  <0.30 (ng/mL)    Relative Index RELATIVE INDEX IS INVALID  0.0 - 2.5    LIPASE, BLOOD     Status: Normal   Collection Time   10/25/11  3:30 AM      Component Value Range Comment   Lipase  32  11 - 59 (U/L)   CBC     Status: Normal   Collection Time   10/25/11  3:30 AM      Component Value Range Comment   WBC 5.5  4.0 - 10.5 (K/uL)    RBC 4.41  3.87 - 5.11 (MIL/uL)    Hemoglobin 12.6  12.0 - 15.0 (g/dL)    HCT 82.9  56.2 - 13.0 (%)    MCV 82.1  78.0 - 100.0 (fL)    MCH 28.6  26.0 - 34.0 (pg)    MCHC 34.8  30.0 - 36.0 (g/dL)    RDW 86.5  78.4 - 69.6 (%)    Platelets 166  150 - 400 (K/uL)   DIFFERENTIAL     Status: Normal   Collection Time   10/25/11  3:30 AM      Component Value Range Comment   Neutrophils Relative 66  43 - 77 (%)    Neutro Abs 3.6  1.7 - 7.7 (K/uL)    Lymphocytes Relative 24  12 - 46 (%)    Lymphs Abs 1.3  0.7 - 4.0 (K/uL)    Monocytes Relative 8  3 - 12 (%)    Monocytes Absolute 0.5  0.1 - 1.0 (K/uL)    Eosinophils Relative 2  0 - 5 (%)    Eosinophils Absolute 0.1  0.0 - 0.7 (K/uL)    Basophils Relative 0  0 - 1 (%)    Basophils Absolute 0.0  0.0 - 0.1 (K/uL)   LIPID PANEL      Status: Normal   Collection Time   10/25/11  3:30 AM      Component Value Range Comment   Cholesterol 144  0 - 200 (mg/dL)    Triglycerides 49  <295 (mg/dL)    HDL 62  >28 (mg/dL)    Total CHOL/HDL Ratio 2.3      VLDL 10  0 - 40 (mg/dL)    LDL Cholesterol 72  0 - 99 (mg/dL)     Micro Results: No results found for this or any previous visit (from the past 240 hour(s)).  Studies/Results: Ct Abdomen Pelvis W Contrast  10/24/2011  *RADIOLOGY REPORT*  Clinical Data: Epigastric pain.  CT ABDOMEN AND PELVIS WITH CONTRAST  Technique:  Multidetector CT imaging of the abdomen and pelvis was performed following the standard protocol during bolus administration of intravenous contrast.  Contrast: OMNIPAQUE IOHEXOL 300 MG/ML IV SOLN  Comparison: 09/20/2010.  Findings: Lung bases are clear.  Heart size normal.  No pericardial or pleural effusion.  Mild intrahepatic and extrahepatic biliary duct dilatation appears stable after cholecystectomy.  Liver and adrenal glands are otherwise unremarkable.  Low attenuation lesions in the kidneys measure up to 1.8 cm on the right, as before. Spleen is unremarkable.  Mild prominence of the pancreatic duct is seen in the head region, as before.  Pancreas is uniform in attenuation.  No surrounding inflammatory changes.  The stomach and bowel are unremarkable.  There are scattered surgical clips in the abdomen.  Trace pelvic free fluid.  No pathologically enlarged lymph nodes.  No worrisome lytic or sclerotic lesions.  IMPRESSION:  1.  No findings to explain the patient's given symptoms. 2.  Trace pelvic free fluid.  Original Report Authenticated By: Reyes Ivan, M.D.   Medications: Scheduled Meds:   . pantoprazole (PROTONIX) IV  40 mg Intravenous Q24H   Continuous Infusions:   . sodium chloride 100 mL/hr (10/25/11 0214)   PRN  Meds:.HYDROmorphone (DILAUDID) injection, metoCLOPramide (REGLAN) injection  Assessment/Plan: #1 acute pancreatitis-resolving. We  start clear liquids and advanced as tolerated. #2 dehydration-hydration improved #3 history of thyroid nodule. If clinically stable by a.m, d/c home.   LOS: 1 day   Quentez Lober 10/25/2011, 12:48 PM

## 2011-10-26 DIAGNOSIS — E860 Dehydration: Secondary | ICD-10-CM | POA: Diagnosis present

## 2011-10-26 DIAGNOSIS — R112 Nausea with vomiting, unspecified: Secondary | ICD-10-CM | POA: Diagnosis present

## 2011-10-26 DIAGNOSIS — K219 Gastro-esophageal reflux disease without esophagitis: Secondary | ICD-10-CM | POA: Diagnosis present

## 2011-10-26 DIAGNOSIS — K529 Noninfective gastroenteritis and colitis, unspecified: Secondary | ICD-10-CM | POA: Diagnosis present

## 2011-10-26 DIAGNOSIS — K859 Acute pancreatitis without necrosis or infection, unspecified: Principal | ICD-10-CM | POA: Diagnosis present

## 2011-10-26 DIAGNOSIS — E876 Hypokalemia: Secondary | ICD-10-CM | POA: Diagnosis present

## 2011-10-26 DIAGNOSIS — R7989 Other specified abnormal findings of blood chemistry: Secondary | ICD-10-CM | POA: Diagnosis present

## 2011-10-26 DIAGNOSIS — E041 Nontoxic single thyroid nodule: Secondary | ICD-10-CM | POA: Insufficient documentation

## 2011-10-26 DIAGNOSIS — E079 Disorder of thyroid, unspecified: Secondary | ICD-10-CM | POA: Insufficient documentation

## 2011-10-26 LAB — COMPREHENSIVE METABOLIC PANEL
ALT: 115 U/L — ABNORMAL HIGH (ref 0–35)
AST: 46 U/L — ABNORMAL HIGH (ref 0–37)
Albumin: 3.4 g/dL — ABNORMAL LOW (ref 3.5–5.2)
Alkaline Phosphatase: 84 U/L (ref 39–117)
BUN: 7 mg/dL (ref 6–23)
CO2: 24 mEq/L (ref 19–32)
Calcium: 8.5 mg/dL (ref 8.4–10.5)
Chloride: 109 mEq/L (ref 96–112)
Creatinine, Ser: 0.75 mg/dL (ref 0.50–1.10)
GFR calc Af Amer: >90 mL/min (ref >90)
GFR calc non Af Amer: >90 mL/min (ref >90)
Glucose, Bld: 95 mg/dL (ref 70–99)
Potassium: 3.4 mEq/L — ABNORMAL LOW (ref 3.5–5.1)
Sodium: 142 mEq/L (ref 135–145)
Total Bilirubin: 1.1 mg/dL (ref 0.3–1.2)
Total Protein: 6.2 g/dL (ref 6.0–8.3)

## 2011-10-26 LAB — DIFFERENTIAL
Basophils Absolute: 0 10*3/uL (ref 0.0–0.1)
Basophils Relative: 0 % (ref 0–1)
Eosinophils Absolute: 0.2 10*3/uL (ref 0.0–0.7)
Eosinophils Relative: 4 % (ref 0–5)
Lymphocytes Relative: 33 % (ref 12–46)
Lymphs Abs: 1.6 10*3/uL (ref 0.7–4.0)
Monocytes Absolute: 0.4 10*3/uL (ref 0.1–1.0)
Monocytes Relative: 9 % (ref 3–12)
Neutro Abs: 2.6 10*3/uL (ref 1.7–7.7)
Neutrophils Relative %: 54 % (ref 43–77)

## 2011-10-26 LAB — CBC
HCT: 34.2 % — ABNORMAL LOW (ref 36.0–46.0)
Hemoglobin: 11.7 g/dL — ABNORMAL LOW (ref 12.0–15.0)
MCH: 27.8 pg (ref 26.0–34.0)
MCHC: 34.2 g/dL (ref 30.0–36.0)
MCV: 81.2 fL (ref 78.0–100.0)
Platelets: 171 10*3/uL (ref 150–400)
RBC: 4.21 MIL/uL (ref 3.87–5.11)
RDW: 12.9 % (ref 11.5–15.5)
WBC: 4.8 10*3/uL (ref 4.0–10.5)

## 2011-10-26 LAB — LIPASE, BLOOD: Lipase: 52 U/L (ref 11–59)

## 2011-10-26 MED ORDER — POTASSIUM CHLORIDE CRYS ER 20 MEQ PO TBCR
20.0000 meq | EXTENDED_RELEASE_TABLET | Freq: Once | ORAL | Status: DC
  Filled 2011-10-26: qty 1

## 2011-10-26 MED ORDER — HYDROCODONE-ACETAMINOPHEN 5-325 MG PO TABS: 1.0000 | ORAL_TABLET | Freq: Four times a day (QID) | ORAL | Status: AC | PRN

## 2011-10-26 NOTE — Discharge Summary (Signed)
Discharge Summary  [name] [name] MR#: [id number]  DOB:1956-06-17  Date of Admission: 10/24/2011 Date of Discharge: 10/26/2011  Patient's PCP: Laurena Slimmer, MD, MD  Patient's GI: Dr. Matthias Hughs  Attending Physician:Nelsie Domino A  Consults: None.  Discharge Diagnoses: Principal Problem:  *Acute pancreatitis Active Problems:  Acid reflux  Nausea and vomiting in adult  Dehydration  Gastroenteritis  Hypokalemia  Elevated liver function tests   Brief Admitting History and Physical 56 year old female with history of thyroid nodule, occurred presented on 10/24/2011 with complaints of abdominal pain for one day duration.  Discharge Medications Medication List  As of 10/26/2011 12:01 PM   TAKE these medications         HYDROcodone-acetaminophen 5-325 MG per tablet   Commonly known as: NORCO   Take 1 tablet by mouth every 6 (six) hours as needed for pain.      loperamide 2 MG capsule   Commonly known as: IMODIUM   Take two tabs po initially, then one tab after each loose stool: max 8 tabs in 24 hours      metoCLOPramide 10 MG tablet   Commonly known as: REGLAN   Take 1 tablet (10 mg total) by mouth every 6 (six) hours as needed (nausea/headache).      omeprazole 40 MG capsule   Commonly known as: PRILOSEC   Take 40 mg by mouth daily.            Hospital Course: 1.  Presumed acute pancreatitis versus gastroenteritis.  Improved.  Patient's lipase on presentation was 131 which was only mildly elevated.  Patient had CT of the abdomen and pelvis with results as indicated below, which showed no specific finding.  Initially the patient was made n.p.o. after improvement in her symptoms she was started on clear liquid diet and was advanced as tolerated.  Uncertain if the patient possibly had viral gastroenteritis which improved with conservative management.  Patient was tolerating solid food prior to discharge.  Patient's lipid panel was checked and indicated good control, LDL  was 72 and HDL was 62.  2.  Dehydration.  Presumed to be from #1 nausea and vomiting.  Resolved with IV hydration.  3.  History of GERD.  Continue PPI.  4.  Hypokalemia.  Likely due to nausea and vomiting.  Replace as needed.  5.  Mildly elevated liver function tests.  Likely due to #1.  Improved.   Day of Discharge BP 107/73  Pulse 64  Temp(Src) 98 F (36.7 C) (Oral)  Resp 16  Ht 5' 6.5" (1.689 m)  Wt 86.183 kg (190 lb)  BMI 30.21 kg/m2  SpO2 98%  Results for orders placed during the hospital encounter of 10/24/11 (from the past 48 hour(s))  CARDIAC PANEL(CRET KIN+CKTOT+MB+TROPI)     Status: Normal   Collection Time   10/24/11  1:35 PM      Component Value Range Comment   Total CK 59  7 - 177 (U/L)    CK, MB 1.7  0.3 - 4.0 (ng/mL)    Troponin I <0.30  <0.30 (ng/mL)    Relative Index RELATIVE INDEX IS INVALID  0.0 - 2.5    MAGNESIUM     Status: Normal   Collection Time   10/24/11  1:35 PM      Component Value Range Comment   Magnesium 1.9  1.5 - 2.5 (mg/dL)   CBC     Status: Abnormal   Collection Time   10/24/11  1:35 PM      Component  Value Range Comment   WBC 4.4  4.0 - 10.5 (K/uL)    RBC 4.18  3.87 - 5.11 (MIL/uL)    Hemoglobin 11.9 (*) 12.0 - 15.0 (g/dL)    HCT 16.1 (*) 09.6 - 46.0 (%)    MCV 81.8  78.0 - 100.0 (fL)    MCH 28.5  26.0 - 34.0 (pg)    MCHC 34.8  30.0 - 36.0 (g/dL)    RDW 04.5  40.9 - 81.1 (%)    Platelets 166  150 - 400 (K/uL)   DIFFERENTIAL     Status: Normal   Collection Time   10/24/11  1:35 PM      Component Value Range Comment   Neutrophils Relative 70  43 - 77 (%)    Neutro Abs 3.1  1.7 - 7.7 (K/uL)    Lymphocytes Relative 22  12 - 46 (%)    Lymphs Abs 1.0  0.7 - 4.0 (K/uL)    Monocytes Relative 8  3 - 12 (%)    Monocytes Absolute 0.3  0.1 - 1.0 (K/uL)    Eosinophils Relative 0  0 - 5 (%)    Eosinophils Absolute 0.0  0.0 - 0.7 (K/uL)    Basophils Relative 0  0 - 1 (%)    Basophils Absolute 0.0  0.0 - 0.1 (K/uL)   CARDIAC PANEL(CRET  KIN+CKTOT+MB+TROPI)     Status: Normal   Collection Time   10/24/11  8:30 PM      Component Value Range Comment   Total CK 65  7 - 177 (U/L)    CK, MB 1.8  0.3 - 4.0 (ng/mL)    Troponin I <0.30  <0.30 (ng/mL)    Relative Index RELATIVE INDEX IS INVALID  0.0 - 2.5    CARDIAC PANEL(CRET KIN+CKTOT+MB+TROPI)     Status: Normal   Collection Time   10/25/11  3:30 AM      Component Value Range Comment   Total CK 63  7 - 177 (U/L)    CK, MB 1.8  0.3 - 4.0 (ng/mL)    Troponin I <0.30  <0.30 (ng/mL)    Relative Index RELATIVE INDEX IS INVALID  0.0 - 2.5    LIPASE, BLOOD     Status: Normal   Collection Time   10/25/11  3:30 AM      Component Value Range Comment   Lipase 32  11 - 59 (U/L)   CBC     Status: Normal   Collection Time   10/25/11  3:30 AM      Component Value Range Comment   WBC 5.5  4.0 - 10.5 (K/uL)    RBC 4.41  3.87 - 5.11 (MIL/uL)    Hemoglobin 12.6  12.0 - 15.0 (g/dL)    HCT 91.4  78.2 - 95.6 (%)    MCV 82.1  78.0 - 100.0 (fL)    MCH 28.6  26.0 - 34.0 (pg)    MCHC 34.8  30.0 - 36.0 (g/dL)    RDW 21.3  08.6 - 57.8 (%)    Platelets 166  150 - 400 (K/uL)   DIFFERENTIAL     Status: Normal   Collection Time   10/25/11  3:30 AM      Component Value Range Comment   Neutrophils Relative 66  43 - 77 (%)    Neutro Abs 3.6  1.7 - 7.7 (K/uL)    Lymphocytes Relative 24  12 - 46 (%)    Lymphs Abs 1.3  0.7 - 4.0 (K/uL)    Monocytes Relative 8  3 - 12 (%)    Monocytes Absolute 0.5  0.1 - 1.0 (K/uL)    Eosinophils Relative 2  0 - 5 (%)    Eosinophils Absolute 0.1  0.0 - 0.7 (K/uL)    Basophils Relative 0  0 - 1 (%)    Basophils Absolute 0.0  0.0 - 0.1 (K/uL)   LIPID PANEL     Status: Normal   Collection Time   10/25/11  3:30 AM      Component Value Range Comment   Cholesterol 144  0 - 200 (mg/dL)    Triglycerides 49  <161 (mg/dL)    HDL 62  >09 (mg/dL)    Total CHOL/HDL Ratio 2.3      VLDL 10  0 - 40 (mg/dL)    LDL Cholesterol 72  0 - 99 (mg/dL)   CBC     Status: Abnormal    Collection Time   10/26/11  3:10 AM      Component Value Range Comment   WBC 4.8  4.0 - 10.5 (K/uL)    RBC 4.21  3.87 - 5.11 (MIL/uL)    Hemoglobin 11.7 (*) 12.0 - 15.0 (g/dL)    HCT 60.4 (*) 54.0 - 46.0 (%)    MCV 81.2  78.0 - 100.0 (fL)    MCH 27.8  26.0 - 34.0 (pg)    MCHC 34.2  30.0 - 36.0 (g/dL)    RDW 98.1  19.1 - 47.8 (%)    Platelets 171  150 - 400 (K/uL)   DIFFERENTIAL     Status: Normal   Collection Time   10/26/11  3:10 AM      Component Value Range Comment   Neutrophils Relative 54  43 - 77 (%)    Neutro Abs 2.6  1.7 - 7.7 (K/uL)    Lymphocytes Relative 33  12 - 46 (%)    Lymphs Abs 1.6  0.7 - 4.0 (K/uL)    Monocytes Relative 9  3 - 12 (%)    Monocytes Absolute 0.4  0.1 - 1.0 (K/uL)    Eosinophils Relative 4  0 - 5 (%)    Eosinophils Absolute 0.2  0.0 - 0.7 (K/uL)    Basophils Relative 0  0 - 1 (%)    Basophils Absolute 0.0  0.0 - 0.1 (K/uL)   COMPREHENSIVE METABOLIC PANEL     Status: Abnormal   Collection Time   10/26/11  3:10 AM      Component Value Range Comment   Sodium 142  135 - 145 (mEq/L)    Potassium 3.4 (*) 3.5 - 5.1 (mEq/L)    Chloride 109  96 - 112 (mEq/L)    CO2 24  19 - 32 (mEq/L)    Glucose, Bld 95  70 - 99 (mg/dL)    BUN 7  6 - 23 (mg/dL)    Creatinine, Ser 2.95  0.50 - 1.10 (mg/dL)    Calcium 8.5  8.4 - 10.5 (mg/dL)    Total Protein 6.2  6.0 - 8.3 (g/dL)    Albumin 3.4 (*) 3.5 - 5.2 (g/dL)    AST 46 (*) 0 - 37 (U/L)    ALT 115 (*) 0 - 35 (U/L)    Alkaline Phosphatase 84  39 - 117 (U/L)    Total Bilirubin 1.1  0.3 - 1.2 (mg/dL)    GFR calc non Af Amer >90  >90 (mL/min)    GFR calc  Af Amer >90  >90 (mL/min)   LIPASE, BLOOD     Status: Normal   Collection Time   10/26/11  3:10 AM      Component Value Range Comment   Lipase 52  11 - 59 (U/L)     Ct Abdomen Pelvis W Contrast  10/24/2011  *RADIOLOGY REPORT*  Clinical Data: Epigastric pain.  CT ABDOMEN AND PELVIS WITH CONTRAST  Technique:  Multidetector CT imaging of the abdomen and pelvis was  performed following the standard protocol during bolus administration of intravenous contrast.  Contrast: OMNIPAQUE IOHEXOL 300 MG/ML IV SOLN  Comparison: 09/20/2010.  Findings: Lung bases are clear.  Heart size normal.  No pericardial or pleural effusion.  Mild intrahepatic and extrahepatic biliary duct dilatation appears stable after cholecystectomy.  Liver and adrenal glands are otherwise unremarkable.  Low attenuation lesions in the kidneys measure up to 1.8 cm on the right, as before. Spleen is unremarkable.  Mild prominence of the pancreatic duct is seen in the head region, as before.  Pancreas is uniform in attenuation.  No surrounding inflammatory changes.  The stomach and bowel are unremarkable.  There are scattered surgical clips in the abdomen.  Trace pelvic free fluid.  No pathologically enlarged lymph nodes.  No worrisome lytic or sclerotic lesions.  IMPRESSION:  1.  No findings to explain the patient's given symptoms. 2.  Trace pelvic free fluid.  Original Report Authenticated By: Reyes Ivan, M.D.   Disposition: Home  Diet: Low-fat diet.  Activity: Resume as tolerated.   Follow-up Appts: Discharge Orders    Future Appointments: Provider: Department: Dept Phone: Center:   01/09/2012 10:00 AM [facility] [phone] [facility]     Future Orders Please Complete By Expires   Increase activity slowly      Discharge instructions      Comments:   Followup with Laurena Slimmer, MD (PCP) in 1 week if needed. Please followup with Dr. Matthias Hughs (GI) in 1-2 weeks.  Diet: Low fat diet.      TESTS THAT NEED FOLLOW-UP None  Time spent on discharge, talking to the patient, and coordinating care: 25 mins.   Signed: Cristal Ford, MD 10/26/2011, 12:01 PM

## 2011-10-26 NOTE — Progress Notes (Signed)
Subjective: Patient complaining of intermittent abdominal pain but is doing a lot better and able to tolerate an oral intake.  Objective: Vital signs in last 24 hours: Filed Vitals:   10/25/11 0604 10/25/11 1319 10/25/11 2220 10/26/11 0541  BP: 98/60 134/69 118/68 107/73  Pulse: 77 74 76 64  Temp: 97.9 F (36.6 C) 97.6 F (36.4 C) 97.8 F (36.6 C) 98 F (36.7 C)  TempSrc: Oral Oral Oral Oral  Resp: 18 18 18 16   Height:      Weight:      SpO2: 98% 100% 98% 98%   Weight change:   Intake/Output Summary (Last 24 hours) at 10/26/11 1154 Last data filed at 10/26/11 0500  Gross per 24 hour  Intake    360 ml  Output   2150 ml  Net  -1790 ml    Physical Exam: General: Awake, Oriented, No acute distress. HEENT: EOMI. Neck: Supple CV: S1 and S2 Lungs: Clear to ascultation bilaterally Abdomen: Soft, Nontender, Nondistended, +bowel sounds. Ext: Good pulses. Trace edema.  Lab Results:  Basename 10/26/11 0310 10/24/11 1335 10/24/11 0440  NA 142 -- 136  K 3.4* -- 3.5  CL 109 -- 103  CO2 24 -- 23  GLUCOSE 95 -- 121*  BUN 7 -- 9  CREATININE 0.75 -- 0.73  CALCIUM 8.5 -- 8.7  MG -- 1.9 --  PHOS -- -- --    Basename 10/26/11 0310 10/24/11 0440  AST 46* 348*  ALT 115* 180*  ALKPHOS 84 96  BILITOT 1.1 1.8*  PROT 6.2 6.7  ALBUMIN 3.4* 3.8    Basename 10/26/11 0310 10/25/11 0330  LIPASE 52 32  AMYLASE -- --    Basename 10/26/11 0310 10/25/11 0330  WBC 4.8 5.5  NEUTROABS 2.6 3.6  HGB 11.7* 12.6  HCT 34.2* 36.2  MCV 81.2 82.1  PLT 171 166    Basename 10/25/11 0330 10/24/11 2030 10/24/11 1335  CKTOTAL 63 65 59  CKMB 1.8 1.8 1.7  CKMBINDEX -- -- --  TROPONINI <0.30 <0.30 <0.30   No components found with this basename: POCBNP:3 No results found for this basename: DDIMER:2 in the last 72 hours No results found for this basename: HGBA1C:2 in the last 72 hours  Basename 10/25/11 0330  CHOL 144  HDL 62  LDLCALC 72  TRIG 49  CHOLHDL 2.3  LDLDIRECT --   No  results found for this basename: TSH,T4TOTAL,FREET3,T3FREE,THYROIDAB in the last 72 hours No results found for this basename: VITAMINB12:2,FOLATE:2,FERRITIN:2,TIBC:2,IRON:2,RETICCTPCT:2 in the last 72 hours  Micro Results: No results found for this or any previous visit (from the past 240 hour(s)).  Studies/Results: No results found.  Medications: I have reviewed the patient's current medications. Scheduled Meds:   . pantoprazole  40 mg Oral BID AC  . DISCONTD: pantoprazole (PROTONIX) IV  40 mg Intravenous Q24H   Continuous Infusions:   . DISCONTD: sodium chloride 100 mL/hr (10/25/11 0214)   PRN Meds:.HYDROmorphone (DILAUDID) injection, metoCLOPramide (REGLAN) injection  Assessment/Plan: 1.  Presumed acute pancreatitis versus gastroenteritis.  Improved.  Patient tolerating solid food.  2.  Dehydration.  Presumed to be from #1 nausea and vomiting.  Resolved with IV hydration.  3.  History of GERD.  Continue PPI.  4.  Hypokalemia.  Likely due to nausea and vomiting.  Replace as needed.  5.  Mildly elevated liver function tests.  Likely due to #1.  Improved.  6.  Disposition.  Discharge the patient home today.   LOS: 2 days  Joniqua Sidle A, MD  10/26/2011, 11:54 AM

## 2011-10-31 ENCOUNTER — Other Ambulatory Visit: Payer: Self-pay | Admitting: Internal Medicine

## 2011-10-31 DIAGNOSIS — R198 Other specified symptoms and signs involving the digestive system and abdomen: Secondary | ICD-10-CM

## 2011-11-02 ENCOUNTER — Ambulatory Visit (HOSPITAL_COMMUNITY)
Admission: RE | Admit: 2011-11-02 | Discharge: 2011-11-02 | Disposition: A | Discharge status: Home / Self Care | Payer: Managed Care, Other (non HMO) | Source: Ambulatory Visit | Attending: Internal Medicine | Admitting: Internal Medicine

## 2011-11-02 DIAGNOSIS — R198 Other specified symptoms and signs involving the digestive system and abdomen: Secondary | ICD-10-CM

## 2011-11-02 DIAGNOSIS — R109 Unspecified abdominal pain: Secondary | ICD-10-CM | POA: Insufficient documentation

## 2011-12-20 ENCOUNTER — Encounter (HOSPITAL_COMMUNITY): Payer: Self-pay | Admitting: *Deleted

## 2011-12-21 ENCOUNTER — Encounter (HOSPITAL_COMMUNITY): Payer: Self-pay | Admitting: Anesthesiology

## 2011-12-21 ENCOUNTER — Ambulatory Visit (HOSPITAL_COMMUNITY): Payer: Managed Care, Other (non HMO) | Admitting: Anesthesiology

## 2011-12-21 ENCOUNTER — Encounter (HOSPITAL_COMMUNITY): Payer: Self-pay | Admitting: *Deleted

## 2011-12-21 ENCOUNTER — Encounter (HOSPITAL_COMMUNITY)
Admission: RE | Disposition: A | Discharge status: Home / Self Care | Payer: Self-pay | Source: Ambulatory Visit | Attending: Gastroenterology

## 2011-12-21 ENCOUNTER — Ambulatory Visit (HOSPITAL_COMMUNITY)
Admission: RE | Admit: 2011-12-21 | Discharge: 2011-12-21 | Disposition: A | Discharge status: Home / Self Care | Payer: Managed Care, Other (non HMO) | Source: Ambulatory Visit | Attending: Gastroenterology | Admitting: Gastroenterology

## 2011-12-21 DIAGNOSIS — R109 Unspecified abdominal pain: Secondary | ICD-10-CM | POA: Insufficient documentation

## 2011-12-21 DIAGNOSIS — R7989 Other specified abnormal findings of blood chemistry: Secondary | ICD-10-CM | POA: Insufficient documentation

## 2011-12-21 DIAGNOSIS — K8689 Other specified diseases of pancreas: Secondary | ICD-10-CM | POA: Insufficient documentation

## 2011-12-21 HISTORY — DX: Personal history of other diseases of the digestive system: Z87.19

## 2011-12-21 HISTORY — PX: EUS: SHX5427

## 2011-12-21 HISTORY — DX: Headache: R51

## 2011-12-21 HISTORY — DX: Nontoxic single thyroid nodule: E04.1

## 2011-12-21 HISTORY — DX: Unspecified osteoarthritis, unspecified site: M19.90

## 2011-12-21 SURGERY — ESOPHAGEAL ENDOSCOPIC ULTRASOUND (EUS) RADIAL
Anesthesia: Monitor Anesthesia Care

## 2011-12-21 MED ORDER — LIDOCAINE HCL (CARDIAC) 20 MG/ML IV SOLN
INTRAVENOUS | Status: DC | PRN
  Administered 2011-12-21: 100 mg via INTRAVENOUS

## 2011-12-21 MED ORDER — LACTATED RINGERS IV SOLN
INTRAVENOUS | Status: DC
  Administered 2011-12-21: 1000 mL via INTRAVENOUS

## 2011-12-21 MED ORDER — LACTATED RINGERS IV SOLN
INTRAVENOUS | Status: DC | PRN
  Administered 2011-12-21: 08:00:00 via INTRAVENOUS

## 2011-12-21 MED ORDER — GLUCAGON HCL (RDNA) 1 MG IJ SOLR
INTRAMUSCULAR | Status: AC
  Filled 2011-12-21: qty 1

## 2011-12-21 MED ORDER — PROPOFOL 10 MG/ML IV EMUL
INTRAVENOUS | Status: DC | PRN
  Administered 2011-12-21: 120 ug/kg/min via INTRAVENOUS

## 2011-12-21 MED ORDER — SODIUM CHLORIDE 0.9 % IV SOLN
1.5000 g | Freq: Once | INTRAVENOUS | Status: DC
  Filled 2011-12-21: qty 1.5

## 2011-12-21 MED ORDER — FENTANYL CITRATE 0.05 MG/ML IJ SOLN
INTRAMUSCULAR | Status: DC | PRN
  Administered 2011-12-21 (×2): 50 ug via INTRAVENOUS

## 2011-12-21 MED ORDER — MIDAZOLAM HCL 5 MG/5ML IJ SOLN
INTRAMUSCULAR | Status: DC | PRN
  Administered 2011-12-21: 2 mg via INTRAVENOUS

## 2011-12-21 MED ORDER — BUTAMBEN-TETRACAINE-BENZOCAINE 2-2-14 % EX AERO
INHALATION_SPRAY | CUTANEOUS | Status: DC | PRN
  Administered 2011-12-21: 2 via TOPICAL

## 2011-12-21 NOTE — Anesthesia Postprocedure Evaluation (Signed)
  Anesthesia Post-op Note  Patient: [name] [name]  Procedure(s) Performed: Procedure(s) (LRB): ESOPHAGEAL ENDOSCOPIC ULTRASOUND (EUS) RADIAL (N/A) ENDOSCOPIC RETROGRADE CHOLANGIOPANCREATOGRAPHY (ERCP) (N/A)  Patient Location: PACU  Anesthesia Type: MAC  Level of Consciousness: awake and alert   Airway and Oxygen Therapy: Patient Spontanous Breathing  Post-op Pain: mild  Post-op Assessment: Post-op Vital signs reviewed, Patient's Cardiovascular Status Stable, Respiratory Function Stable, Patent Airway and No signs of Nausea or vomiting  Post-op Vital Signs: stable  Complications: No apparent anesthesia complications

## 2011-12-21 NOTE — H&P (Signed)
Patient interval history reviewed.  Patient examined again.  There has been no change from documented H/P dated 12/15/11 (scanned into chart from our office) except as documented above.  Assessment:  Intermittent abdominal pain with fluctuating elevated LFTs.  Possible choledocholithiasis.  Post cholecystectomy 1989.  Plan:  Endoscopic ultrasound.           Endoscopic retrograde cholangiopancreatography, if needed, for stone extraction.  Risks (bleeding, infection, bowel perforation that could require surgery, sedation-related changes in cardiopulmonary systems), benefits (identification and possible treatment of source of symptoms, exclusion of certain causes of symptoms), and alternatives (watchful waiting, radiographic imaging studies, empiric medical treatment) of upper endoscopy with ultrasound (EUS) were explained to patient/family in detail and patient wishes to proceed.  Risks (up to and including bleeding, infection, perforation, pancreatitis that can be complicated by infected necrosis and death), benefits (removal of stones, alleviating blockage, decreasing risk of cholangitis or choledocholithiasis-related pancreatitis), and alternatives (watchful waiting, percutaneous transhepatic cholangiography) of ERCP were explained to patient/family in detail and patient elects to proceed.

## 2011-12-21 NOTE — Anesthesia Preprocedure Evaluation (Signed)
Anesthesia Evaluation  Patient identified by MRN, date of birth, ID band Patient awake    Reviewed: Allergy & Precautions, H&P , NPO status , Patient's Chart, lab work & pertinent test results  Airway Mallampati: II TM Distance: >3 FB Neck ROM: Full    Dental No notable dental hx.    Pulmonary neg pulmonary ROS,  breath sounds clear to auscultation  Pulmonary exam normal       Cardiovascular negative cardio ROS  Rhythm:Regular Rate:Normal     Neuro/Psych  Headaches, negative psych ROS   GI/Hepatic Neg liver ROS, hiatal hernia, GERD-  ,  Endo/Other  negative endocrine ROS  Renal/GU negative Renal ROS  negative genitourinary   Musculoskeletal negative musculoskeletal ROS (+)   Abdominal   Peds negative pediatric ROS (+)  Hematology negative hematology ROS (+)   Anesthesia Other Findings   Reproductive/Obstetrics negative OB ROS                           Anesthesia Physical Anesthesia Plan  ASA: II  Anesthesia Plan: MAC   Post-op Pain Management:    Induction: Intravenous  Airway Management Planned: Nasal Cannula  Additional Equipment:   Intra-op Plan:   Post-operative Plan: Extubation in OR  Informed Consent: I have reviewed the patients History and Physical, chart, labs and discussed the procedure including the risks, benefits and alternatives for the proposed anesthesia with the patient or authorized representative who has indicated his/her understanding and acceptance.   Dental advisory given  Plan Discussed with: CRNA  Anesthesia Plan Comments:         Anesthesia Quick Evaluation

## 2011-12-21 NOTE — Discharge Instructions (Addendum)
Endoscopic Ultrasound (EUS)  Care After  Please read the instructions outlined below and refer to this sheet in the next few weeks. These discharge instructions provide you with general information on caring for yourself after you leave the hospital. Your doctor may also give you specific instructions. While your treatment has been planned according to the most current medical practices available, unavoidable complications occasionally occur. If you have any problems or questions after discharge, please call Dr. Outlaw ([facility]) at [phone].  HOME CARE INSTRUCTIONS  Activity  You may resume your regular activity but move at a slower pace for the next 24 hours.   Take frequent rest periods for the next 24 hours.   Walking will help expel (get rid of) the air and reduce the bloated feeling in your abdomen.   No driving for 24 hours (because of the anesthesia (medicine) used during the test).   You may shower.   Do not sign any important legal documents or operate any machinery for 24 hours (because of the anesthesia used during the test).  Nutrition  Drink plenty of fluids.   You may resume your normal diet.   Begin with a light meal and progress to your normal diet.   Avoid alcoholic beverages for 24 hours or as instructed by your caregiver.   Medications You may resume your normal medications unless your caregiver tells you otherwise.  What you can expect today  You may experience abdominal discomfort such as a feeling of fullness or "gas" pains.   You may experience a sore throat for 2 to 3 days. This is normal. Gargling with salt water may help this.   SEEK IMMEDIATE MEDICAL CARE IF:  You have excessive nausea (feeling sick to your stomach) and/or vomiting.   You have severe abdominal pain and distention (swelling).   You have trouble swallowing.   You have a temperature over 100 F (37.8 C).   You have rectal bleeding or vomiting of blood.    Document Released: 04/05/2004 Document Revised: 05/04/2011 Document Reviewed: 10/17/2007 ExitCare Patient Information 2012 [employer]. Endoscopy Care After Please read the instructions outlined below and refer to this sheet in the next few weeks. These discharge instructions provide you with general information on caring for yourself after you leave the hospital. Your doctor may also give you specific instructions. While your treatment has been planned according to the most current medical practices available, unavoidable complications occasionally occur. If you have any problems or questions after discharge, please call your doctor. HOME CARE INSTRUCTIONS Activity  You may resume your regular activity but move at a slower pace for the next 24 hours.   Take frequent rest periods for the next 24 hours.   Walking will help expel (get rid of) the air and reduce the bloated feeling in your abdomen.   No driving for 24 hours (because of the anesthesia (medicine) used during the test).   You may shower.   Do not sign any important legal documents or operate any machinery for 24 hours (because of the anesthesia used during the test).  Nutrition  Drink plenty of fluids.   You may resume your normal diet.   Begin with a light meal and progress to your normal diet.   Avoid alcoholic beverages for 24 hours or as instructed by your caregiver.  Medications You may resume your normal medications unless your caregiver tells you otherwise. What you can expect today  You may experience abdominal discomfort such as a feeling   of fullness or "gas" pains.   You may experience a sore throat for 2 to 3 days. This is normal. Gargling with salt water may help this.  Follow-up Your doctor will discuss the results of your test with you. SEEK IMMEDIATE MEDICAL CARE IF:  You have excessive nausea (feeling sick to your stomach) and/or vomiting.   You have severe abdominal pain and distention  (swelling).   You have trouble swallowing.   You have a temperature over 100 F (37.8 C).   You have rectal bleeding or vomiting of blood.  Document Released: 04/05/2004 Document Revised: 08/11/2011 Document Reviewed: 10/17/2007 ExitCare Patient Information 2012 [employer]. 

## 2011-12-21 NOTE — Op Note (Signed)
[facility] [address] [place], [place]  [address]  ENDOSCOPIC ULTRASOUND PROCEDURE REPORT  PATIENT:  [name], [name]  MR#:  [id number] BIRTHDATE:  28-Jan-1956  GENDER:  female  ENDOSCOPIST:  Willis Modena, MD REFERRED BY:  Margaretmary Bayley, M.D. Bernette Redbird, M.D.  PROCEDURE DATE:  12/21/2011 PROCEDURE:  Upper EUS ASA CLASS:  Class II INDICATIONS:  intermittent abdominal pain, cyclically elevated liver tests  MEDICATIONS:   Cetacaine spray x 2, MAC sedation, administered by CRNA  DESCRIPTION OF PROCEDURE:   After the risks benefits and alternatives of the procedure were  explained, informed consent was obtained. The patient was then placed in the left, lateral, decubitus postion and IV sedation was administered. Throughout the procedure, the patient's blood pressure, pulse and oxygen saturations were monitored continuously.  Under direct visualization, the Pentax EUS Radial [id number] endoscope was introduced through the mouth and advanced to the second portion of the duodenum.  Water was used as necessary to provide an acoustic interface.  Upon completion of the imaging, water was removed and the patient was sent to the recovery room in satisfactory condition.  <<PROCEDUREIMAGES>>  FINDINGS:  Head and uncinate pancreas appeared normal; body and tail a bit hyperechoic, consistent  with fatty pancreas, otherwise normal.  Pancreatic duct prominent ( 4mm) in head, tapers to normal caliber in the body and tail.  No pancreatic mass or features of chronic pancreatitis.  Common hepatic duct 8mm, and common bile duct tapers to   6mm in the intrapancreatic portion. Ampulla normal via EUS.  Post-cholecystectomy.  No evidence of bile duct wall thickening.  No evidence of choledocholithiasis. ENDOSCOPIC IMPRESSION:    1.  Mild prominence to bile duct, in patient post-cholecystectomy; no choledocholithiasis. 2.  Prominent pancreatic duct (head), tapers to normal in  body/tail. 3.  Fatty pancreas in body/tail, otherwise normal pancreas without mass or chronic pancreatitis. 4.  No explanation for patient's symptoms identified on this examination.  Sphincter of Oddi             dysfunction is another possibility.  RECOMMENDATIONS:      1.  Watch for potential complications of procedure 2.  If pain symptoms recur, consider trial of antispasmodic.  If this is not helpful, referral to tertiary center for Sphincter of Oddi manometry could be considered. 3.  Will discuss with Dr. Matthias Hughs.  ______________________________ Willis Modena  CC:  n. eSIGNED:   Willis Modena at 12/21/2011 09:18 AM  Kathaleen Bury, [phone]

## 2011-12-21 NOTE — Transfer of Care (Signed)
Immediate Anesthesia Transfer of Care Note  Patient: [name] [name]  Procedure(s) Performed: Procedure(s) (LRB): ESOPHAGEAL ENDOSCOPIC ULTRASOUND (EUS) RADIAL (N/A) ENDOSCOPIC RETROGRADE CHOLANGIOPANCREATOGRAPHY (ERCP) (N/A)  Patient Location: PACU  Anesthesia Type: MAC  Level of Consciousness: awake, sedated and patient cooperative  Airway & Oxygen Therapy: Patient Spontanous Breathing and Patient connected to nasal cannula oxygen  Post-op Assessment: Report given to PACU RN and Post -op Vital signs reviewed and stable  Post vital signs: Reviewed and stable  Complications: No apparent anesthesia complications

## 2011-12-26 ENCOUNTER — Encounter (HOSPITAL_COMMUNITY): Payer: Self-pay | Admitting: Gastroenterology

## 2012-01-09 ENCOUNTER — Ambulatory Visit
Admission: RE | Admit: 2012-01-09 | Discharge: 2012-01-09 | Disposition: A | Discharge status: Home / Self Care | Payer: Managed Care, Other (non HMO) | Source: Ambulatory Visit | Attending: Surgery | Admitting: Surgery

## 2012-01-09 ENCOUNTER — Telehealth (INDEPENDENT_AMBULATORY_CARE_PROVIDER_SITE_OTHER): Payer: Self-pay | Admitting: Surgery

## 2012-01-09 DIAGNOSIS — E041 Nontoxic single thyroid nodule: Secondary | ICD-10-CM

## 2012-01-11 ENCOUNTER — Telehealth (INDEPENDENT_AMBULATORY_CARE_PROVIDER_SITE_OTHER): Payer: Self-pay

## 2012-01-11 NOTE — Telephone Encounter (Signed)
LMOM pt's labs should be done by PCP we only need u/s done prior to appt.

## 2012-02-08 ENCOUNTER — Ambulatory Visit (INDEPENDENT_AMBULATORY_CARE_PROVIDER_SITE_OTHER): Payer: Managed Care, Other (non HMO) | Admitting: Surgery

## 2012-02-08 ENCOUNTER — Encounter (INDEPENDENT_AMBULATORY_CARE_PROVIDER_SITE_OTHER): Payer: Self-pay | Admitting: Surgery

## 2012-02-08 VITALS — BP 100/74 | HR 83 | Temp 97.5°F | Resp 14 | Ht 66.5 in | Wt 189.6 lb

## 2012-02-08 DIAGNOSIS — E042 Nontoxic multinodular goiter: Secondary | ICD-10-CM

## 2012-02-08 LAB — TSH: TSH: 0.933 u[IU]/mL (ref 0.350–4.500)

## 2012-02-08 NOTE — Progress Notes (Signed)
Visit Diagnoses: 1. Multinodular goiter (nontoxic)     HISTORY: Patient is a 56 year old black female followed for bilateral thyroid nodules and probable underlying thyroiditis. Patient was last seen in May of 2012. At my request she underwent a followup thyroid ultrasound on Jan 09, 2012. This shows a slightly enlarged thyroid gland. It is inhomogeneous consistent with underlying thyroiditis. Dominant nodules were present including a 3.4 cm nodule on the left and a 2.3 cm nodule on the right. These are stable compared to prior studies. Patient had undergone fine-needle aspiration in 2007 with benign cytopathology. Patient has not had a recent TSH level.  PERTINENT REVIEW OF SYSTEMS: Patient has persisting complaints of soreness and discomfort in the shoulders and neck. She denies tremor. She denies palpitations. She denies dysphagia.  EXAM: HEENT: normocephalic; pupils equal and reactive; sclerae clear; dentition good; mucous membranes moist NECK:  Enlarged thyroid gland which is quite firm on palpation. Dominant nodules bilaterally corresponding to ultrasound description; asymmetric on extension; no palpable anterior or posterior cervical lymphadenopathy; no supraclavicular masses; no tenderness CHEST: clear to auscultation bilaterally without rales, rhonchi, or wheezes CARDIAC: regular rate and rhythm without significant murmur; peripheral pulses are full EXT:  non-tender without edema; no deformity NEURO: no gross focal deficits; no sign of tremor   IMPRESSION: #1 bilateral thyroid nodules, clinically stable #2 small multinodular goiter #3 probable underlying chronic thyroiditis with normal thyroid function  PLAN: The patient will have a TSH level determined in the near future. I do not think her shoulder and upper back and neck pain is related to her thyroid. I think it is safe to continue to follow her gland with ultrasound in one year. She will return at that time for physical  examination.  Velora Heckler, MD, FACS General & Endocrine Surgery [facility]

## 2012-02-08 NOTE — Patient Instructions (Signed)
Will notify you if TSH level is abnormal.  Velora Heckler, MD, [facility] Office: [phone]

## 2012-02-13 ENCOUNTER — Telehealth (INDEPENDENT_AMBULATORY_CARE_PROVIDER_SITE_OTHER): Payer: Self-pay

## 2012-02-13 NOTE — Telephone Encounter (Signed)
LMOM labs with in normal limits.

## 2012-02-22 ENCOUNTER — Telehealth (INDEPENDENT_AMBULATORY_CARE_PROVIDER_SITE_OTHER): Payer: Self-pay

## 2012-02-22 NOTE — Telephone Encounter (Signed)
lmom Dr Gerrit Friends reviewed labs. Advised labs are at perfect level per Dr Gerrit Friends.

## 2012-03-13 ENCOUNTER — Other Ambulatory Visit: Payer: Self-pay | Admitting: Obstetrics and Gynecology

## 2012-03-13 DIAGNOSIS — Z1231 Encounter for screening mammogram for malignant neoplasm of breast: Secondary | ICD-10-CM

## 2012-03-23 ENCOUNTER — Ambulatory Visit
Admission: RE | Admit: 2012-03-23 | Discharge: 2012-03-23 | Disposition: A | Discharge status: Home / Self Care | Payer: Managed Care, Other (non HMO) | Source: Ambulatory Visit | Attending: Obstetrics and Gynecology | Admitting: Obstetrics and Gynecology

## 2012-03-23 DIAGNOSIS — Z1231 Encounter for screening mammogram for malignant neoplasm of breast: Secondary | ICD-10-CM

## 2012-05-28 ENCOUNTER — Other Ambulatory Visit: Payer: Self-pay | Admitting: Neurosurgery

## 2012-05-28 DIAGNOSIS — R519 Headache, unspecified: Secondary | ICD-10-CM

## 2012-05-31 ENCOUNTER — Ambulatory Visit
Admission: RE | Admit: 2012-05-31 | Discharge: 2012-05-31 | Disposition: A | Discharge status: Home / Self Care | Payer: Managed Care, Other (non HMO) | Source: Ambulatory Visit | Attending: Neurosurgery | Admitting: Neurosurgery

## 2012-05-31 DIAGNOSIS — R519 Headache, unspecified: Secondary | ICD-10-CM

## 2013-03-07 ENCOUNTER — Other Ambulatory Visit: Payer: Self-pay | Admitting: Internal Medicine

## 2013-03-07 DIAGNOSIS — IMO0002 Reserved for concepts with insufficient information to code with codable children: Secondary | ICD-10-CM

## 2013-03-12 ENCOUNTER — Ambulatory Visit
Admission: RE | Admit: 2013-03-12 | Discharge: 2013-03-12 | Disposition: A | Discharge status: Home / Self Care | Payer: Managed Care, Other (non HMO) | Source: Ambulatory Visit | Attending: Internal Medicine | Admitting: Internal Medicine

## 2013-03-12 DIAGNOSIS — IMO0002 Reserved for concepts with insufficient information to code with codable children: Secondary | ICD-10-CM

## 2013-03-12 MED ORDER — IOHEXOL 300 MG/ML  SOLN
75.0000 mL | Freq: Once | INTRAMUSCULAR | Status: AC | PRN
  Administered 2013-03-12: 75 mL via INTRAVENOUS

## 2013-03-15 ENCOUNTER — Other Ambulatory Visit: Payer: Self-pay

## 2013-03-15 DIAGNOSIS — Z1231 Encounter for screening mammogram for malignant neoplasm of breast: Secondary | ICD-10-CM

## 2013-04-02 ENCOUNTER — Ambulatory Visit
Admission: RE | Admit: 2013-04-02 | Discharge: 2013-04-02 | Disposition: A | Discharge status: Home / Self Care | Payer: Managed Care, Other (non HMO) | Source: Ambulatory Visit

## 2013-04-02 DIAGNOSIS — Z1231 Encounter for screening mammogram for malignant neoplasm of breast: Secondary | ICD-10-CM

## 2013-04-29 ENCOUNTER — Other Ambulatory Visit: Payer: Self-pay | Admitting: Otolaryngology

## 2013-04-29 DIAGNOSIS — R591 Generalized enlarged lymph nodes: Secondary | ICD-10-CM

## 2013-04-30 ENCOUNTER — Ambulatory Visit
Admission: RE | Admit: 2013-04-30 | Discharge: 2013-04-30 | Disposition: A | Discharge status: Home / Self Care | Payer: Managed Care, Other (non HMO) | Source: Ambulatory Visit | Attending: Otolaryngology | Admitting: Otolaryngology

## 2013-04-30 ENCOUNTER — Other Ambulatory Visit: Payer: Self-pay | Admitting: Gastroenterology

## 2013-04-30 DIAGNOSIS — R591 Generalized enlarged lymph nodes: Secondary | ICD-10-CM

## 2013-04-30 DIAGNOSIS — R110 Nausea: Secondary | ICD-10-CM

## 2013-04-30 DIAGNOSIS — R1013 Epigastric pain: Secondary | ICD-10-CM

## 2013-05-02 ENCOUNTER — Ambulatory Visit
Admission: RE | Admit: 2013-05-02 | Discharge: 2013-05-02 | Disposition: A | Discharge status: Home / Self Care | Payer: Managed Care, Other (non HMO) | Source: Ambulatory Visit | Attending: Gastroenterology | Admitting: Gastroenterology

## 2013-05-02 DIAGNOSIS — R110 Nausea: Secondary | ICD-10-CM

## 2013-05-02 DIAGNOSIS — R1013 Epigastric pain: Secondary | ICD-10-CM

## 2013-05-22 ENCOUNTER — Ambulatory Visit (INDEPENDENT_AMBULATORY_CARE_PROVIDER_SITE_OTHER): Payer: Managed Care, Other (non HMO) | Admitting: Surgery

## 2013-05-22 ENCOUNTER — Encounter (INDEPENDENT_AMBULATORY_CARE_PROVIDER_SITE_OTHER): Payer: Self-pay | Admitting: Surgery

## 2013-05-22 VITALS — BP 122/80 | HR 76 | Resp 14 | Ht 66.0 in | Wt 192.2 lb

## 2013-05-22 DIAGNOSIS — E042 Nontoxic multinodular goiter: Secondary | ICD-10-CM

## 2013-05-22 NOTE — Patient Instructions (Signed)

## 2013-05-22 NOTE — Progress Notes (Signed)
General Surgery [facility]  Chief Complaint  Patient presents with  . Follow-up    multinodular thyroid goiter    HISTORY: Patient is a 57 year old female followed for bilateral thyroid nodules. Patient has had biopsies in the distant past which were benign. She continues to complain of neck pain which appears to be musculoskeletal in origin. She has been evaluated by Dr. Suzanna Obey at [facility] as well as by Dr. Shirlean [name] from neurosurgery.  CT scan in July 2014 showed no significant abnormality other than bilateral thyroid nodules.  At my request the patient underwent a thyroid ultrasound on 04/30/2013. This shows a mildly enlarged thyroid gland with homogeneous parenchyma. Dominant nodule on the right measures 2.4 cm in dominant nodule on the left measures 3.4 cm. Both are stable compared to prior examinations. No abnormality was seen in the lymph nodes or remainder of the thyroid tissue.  03/07/2013 the patient had laboratory studies performed including a TSH level which was normal at 1.13. She is not on any thyroid hormone supplementation.  PERTINENT REVIEW OF SYSTEMS: Patient continues to note upper neck pain exacerbated by changes in position. She denies tremor. She denies palpitations. She has had no significant dysphagia.  EXAM: HEENT: normocephalic; pupils equal and reactive; sclerae clear; dentition good; mucous membranes moist NECK:  Palpable thyroid nodules bilaterally, moderately firm, mobile with swallowing, nontender; asymmetric on extension; no palpable anterior or posterior cervical lymphadenopathy; no supraclavicular masses; no tenderness CHEST: clear to auscultation bilaterally without rales, rhonchi, or wheezes CARDIAC: regular rate and rhythm without significant murmur; peripheral pulses are full EXT:  non-tender without edema; no deformity NEURO: no gross focal deficits; no sign of tremor   IMPRESSION: Bilateral thyroid nodules, clinically  stable  PLAN: Patient is again reassured that her thyroid disease appears to be benign.  I do not think her thyroid nodules contribute to her cervical pain. I would like to repeat a thyroid ultrasound in 2 years. I will see her for physical examination at that time.  In regards to her ongoing pain without a clear etiology, I recommended that she consider evaluation by physical therapy.  Velora Heckler, MD, [facility] Office: [phone]  Visit Diagnoses: 1. Multinodular goiter (nontoxic)

## 2013-07-16 ENCOUNTER — Other Ambulatory Visit: Payer: Self-pay | Admitting: Gastroenterology

## 2013-07-16 DIAGNOSIS — R1013 Epigastric pain: Secondary | ICD-10-CM

## 2013-07-18 ENCOUNTER — Ambulatory Visit
Admission: RE | Admit: 2013-07-18 | Discharge: 2013-07-18 | Disposition: A | Discharge status: Home / Self Care | Payer: Managed Care, Other (non HMO) | Source: Ambulatory Visit | Attending: Gastroenterology | Admitting: Gastroenterology

## 2013-07-18 DIAGNOSIS — R1013 Epigastric pain: Secondary | ICD-10-CM

## 2013-10-30 DIAGNOSIS — H2513 Age-related nuclear cataract, bilateral: Secondary | ICD-10-CM | POA: Insufficient documentation

## 2013-10-30 DIAGNOSIS — H04123 Dry eye syndrome of bilateral lacrimal glands: Secondary | ICD-10-CM | POA: Insufficient documentation

## 2013-10-30 DIAGNOSIS — H02889 Meibomian gland dysfunction of unspecified eye, unspecified eyelid: Secondary | ICD-10-CM | POA: Insufficient documentation

## 2013-11-15 ENCOUNTER — Emergency Department (HOSPITAL_COMMUNITY)
Admission: EM | Admit: 2013-11-15 | Discharge: 2013-11-15 | Disposition: A | Discharge status: Home / Self Care | Payer: Managed Care, Other (non HMO) | Source: Home / Self Care | Attending: Family Medicine | Admitting: Family Medicine

## 2013-11-15 ENCOUNTER — Encounter (HOSPITAL_COMMUNITY): Payer: Self-pay | Admitting: Emergency Medicine

## 2013-11-15 DIAGNOSIS — H9202 Otalgia, left ear: Secondary | ICD-10-CM

## 2013-11-15 DIAGNOSIS — H6980 Other specified disorders of Eustachian tube, unspecified ear: Secondary | ICD-10-CM

## 2013-11-15 DIAGNOSIS — H9209 Otalgia, unspecified ear: Secondary | ICD-10-CM

## 2013-11-15 MED ORDER — METHYLPREDNISOLONE 4 MG PO KIT: PACK | ORAL | Status: DC

## 2013-11-15 MED ORDER — FLUTICASONE PROPIONATE 50 MCG/ACT NA SUSP: 2.0000 | Freq: Two times a day (BID) | NASAL | Status: DC

## 2013-11-15 NOTE — Discharge Instructions (Signed)
Antibiotic Nonuse  Your caregiver felt that the infection or problem was not one that would be helped with an antibiotic. Infections may be caused by viruses or bacteria. Only a caregiver can tell which one of these is the likely cause of an illness. A cold is the most common cause of infection in both adults and children. A cold is a virus. Antibiotic treatment will have no effect on a viral infection. Viruses can lead to many lost days of work caring for sick children and many missed days of school. Children may catch as many as 10 "colds" or "flus" per year during which they can be tearful, cranky, and uncomfortable. The goal of treating a virus is aimed at keeping the ill person comfortable. Antibiotics are medications used to help the body fight bacterial infections. There are relatively few types of bacteria that cause infections but there are hundreds of viruses. While both viruses and bacteria cause infection they are very different types of germs. A viral infection will typically go away by itself within 7 to 10 days. Bacterial infections may spread or get worse without antibiotic treatment. Examples of bacterial infections are:  Sore throats (like strep throat or tonsillitis).  Infection in the lung (pneumonia).  Ear and skin infections. Examples of viral infections are:  Colds or flus.  Most coughs and bronchitis.  Sore throats not caused by Strep.  Runny noses. It is often best not to take an antibiotic when a viral infection is the cause of the problem. Antibiotics can kill off the helpful bacteria that we have inside our body and allow harmful bacteria to start growing. Antibiotics can cause side effects such as allergies, nausea, and diarrhea without helping to improve the symptoms of the viral infection. Additionally, repeated uses of antibiotics can cause bacteria inside of our body to become resistant. That resistance can be passed onto harmful bacterial. The next time you have  an infection it may be harder to treat if antibiotics are used when they are not needed. Not treating with antibiotics allows our own immune system to develop and take care of infections more efficiently. Also, antibiotics will work better for us when they are prescribed for bacterial infections. Treatments for a child that is ill may include:  Give extra fluids throughout the day to stay hydrated.  Get plenty of rest.  Only give your child over-the-counter or prescription medicines for pain, discomfort, or fever as directed by your caregiver.  The use of a cool mist humidifier may help stuffy noses.  Cold medications if suggested by your caregiver. Your caregiver may decide to start you on an antibiotic if:  The problem you were seen for today continues for a longer length of time than expected.  You develop a secondary bacterial infection. SEEK MEDICAL CARE IF:  Fever lasts longer than 5 days.  Symptoms continue to get worse after 5 to 7 days or become severe.  Difficulty in breathing develops.  Signs of dehydration develop (poor drinking, rare urinating, dark colored urine).  Changes in behavior or worsening tiredness (listlessness or lethargy). Document Released: 10/31/2001 Document Revised: 11/14/2011 Document Reviewed: 04/29/2009 [facility] CareExitCare Patient Information 2014 [place], [place].  Otalgia The most common reason for this in children is an infection of the middle ear. Pain from the middle ear is usually caused by a build-up of fluid and pressure behind the eardrum. Pain from an earache can be sharp, dull, or burning. The pain may be temporary or constant. The middle ear is  connected to the nasal passages by a short narrow tube called the Eustachian tube. The Eustachian tube allows fluid to drain out of the middle ear, and helps keep the pressure in your ear equalized. CAUSES  A cold or allergy can block the Eustachian tube with inflammation and the build-up of secretions. This  is especially likely in small children, because their Eustachian tube is shorter and more horizontal. When the Eustachian tube closes, the normal flow of fluid from the middle ear is stopped. Fluid can accumulate and cause stuffiness, pain, hearing loss, and an ear infection if germs start growing in this area. SYMPTOMS  The symptoms of an ear infection may include fever, ear pain, fussiness, increased crying, and irritability. Many children will have temporary and minor hearing loss during and right after an ear infection. Permanent hearing loss is rare, but the risk increases the more infections a child has. Other causes of ear pain include retained water in the outer ear canal from swimming and bathing. Ear pain in adults is less likely to be from an ear infection. Ear pain may be referred from other locations. Referred pain may be from the joint between your jaw and the skull. It may also come from a tooth problem or problems in the neck. Other causes of ear pain include:  A foreign body in the ear.  Outer ear infection.  Sinus infections.  Impacted ear wax.  Ear injury.  Arthritis of the jaw or TMJ problems.  Middle ear infection.  Tooth infections.  Sore throat with pain to the ears. DIAGNOSIS  Your caregiver can usually make the diagnosis by examining you. Sometimes other special studies, including x-rays and lab work may be necessary. TREATMENT   If antibiotics were prescribed, use them as directed and finish them even if you or your child's symptoms seem to be improved.  Sometimes PE tubes are needed in children. These are little plastic tubes which are put into the eardrum during a simple surgical procedure. They allow fluid to drain easier and allow the pressure in the middle ear to equalize. This helps relieve the ear pain caused by pressure changes. HOME CARE INSTRUCTIONS   Only take over-the-counter or prescription medicines for pain, discomfort, or fever as directed by  your caregiver. DO NOT GIVE CHILDREN ASPIRIN because of the association of Reye's Syndrome in children taking aspirin.  Use a cold pack applied to the outer ear for 15-20 minutes, 03-04 times per day or as needed may reduce pain. Do not apply ice directly to the skin. You may cause frost bite.  Over-the-counter ear drops used as directed may be effective. Your caregiver may sometimes prescribe ear drops.  Resting in an upright position may help reduce pressure in the middle ear and relieve pain.  Ear pain caused by rapidly descending from high altitudes can be relieved by swallowing or chewing gum. Allowing infants to suck on a bottle during airplane travel can help.  Do not smoke in the house or near children. If you are unable to quit smoking, smoke outside.  Control allergies. SEEK IMMEDIATE MEDICAL CARE IF:   You or your child are becoming sicker.  Pain or fever relief is not obtained with medicine.  You or your child's symptoms (pain, fever, or irritability) do not improve within 24 to 48 hours or as instructed.  Severe pain suddenly stops hurting. This may indicate a ruptured eardrum.  You or your children develop new problems such as severe headaches, stiff neck, difficulty  swallowing, or swelling of the face or around the ear. Document Released: 04/08/2004 Document Revised: 11/14/2011 Document Reviewed: 08/13/2008 [facility] Patient Information 2014 [place], [place].

## 2013-11-15 NOTE — ED Provider Notes (Signed)
CSN: [id number]     Arrival date & time 11/15/13  1002 History   First MD Initiated Contact with Patient 11/15/13 1054     Chief Complaint  Patient presents with  . Otalgia   (Consider location/radiation/quality/duration/timing/severity/associated sxs/prior Treatment) HPI Comments: 58 year old female presents complaining of left ear pain, congestion, sneezing, runny her symptoms have been going on now for about 5 days with the exception of the ear pain. The ear pain began to days ago. The pain is intermittent and usually occurs with swallowing. The most significant pain is in her ear, she just thinks she has a cold otherwise. No fever, chills, NVD, chest pain, shortness of breath.  Patient is a 58 y.o. female presenting with ear pain.  Otalgia Associated symptoms: congestion, cough and rhinorrhea   Associated symptoms: no abdominal pain, no fever, no rash, no sore throat and no vomiting     Past Medical History  Diagnosis Date  . Acid reflux   . Hypercholesteremia   . Thyroid disease   . Thyroid nodule   . H/O hiatal hernia   . Headache(784.0)     migraines  . Arthritis     back and neck   Past Surgical History  Procedure Laterality Date  . Abdominal hysterectomy    . Cholecystectomy    . Ectopic pregnancy surgery    . Eye surgery      lasik  . Eus  12/21/2011    Procedure: ESOPHAGEAL ENDOSCOPIC ULTRASOUND (EUS) RADIAL;  Surgeon: Willis Modena, MD;  Location: [facility];  Service: Endoscopy;  Laterality: N/A;   History reviewed. No pertinent family history. History  Substance Use Topics  . Smoking status: Never Smoker   . Smokeless tobacco: Never Used  . Alcohol Use: Yes     Comment: occasionally   OB History   Grav Para Term Preterm Abortions TAB SAB Ect Mult Living                 Review of Systems  Constitutional: Negative for fever, chills and fatigue.  HENT: Positive for congestion, ear pain, postnasal drip and rhinorrhea. Negative for sinus pressure and  sore throat.   Eyes: Negative for visual disturbance.  Respiratory: Positive for cough. Negative for chest tightness, shortness of breath and wheezing.   Cardiovascular: Negative for chest pain, palpitations and leg swelling.  Gastrointestinal: Negative for nausea, vomiting and abdominal pain.  Endocrine: Negative for polydipsia and polyuria.  Genitourinary: Negative for dysuria, urgency and frequency.  Musculoskeletal: Negative for arthralgias and myalgias.  Skin: Negative for rash.  Neurological: Negative for dizziness, weakness and light-headedness.    Allergies  Morphine and related and Sulfa antibiotics  Home Medications   Current Outpatient Rx  Name  Route  Sig  Dispense  Refill  . Calcium Carbonate (CALTRATE 600 PO)   Oral   Take 600 mg by mouth daily.         . Cyanocobalamin (VITAMIN B-12) 1000 MCG SUBL   Sublingual   Place 1,000 mcg under the tongue daily.         . fluticasone (FLONASE) 50 MCG/ACT nasal spray   Each Nare   Place 2 sprays into both nostrils 2 (two) times daily.   16 g   2   . methylPREDNISolone (MEDROL DOSEPAK) 4 MG tablet      follow package directions   21 tablet   0   . Multiple Vitamin (MULTIVITAMIN) tablet   Oral   Take 1 tablet by mouth daily.         Marland Kitchen  omeprazole (PRILOSEC) 40 MG capsule   Oral   Take 40 mg by mouth daily.         . sucralfate (CARAFATE) 1 G tablet   Oral   Take 1 g by mouth daily.         . vitamin C (ASCORBIC ACID) 500 MG tablet   Oral   Take 500 mg by mouth daily.          BP 119/75  Pulse 84  Temp(Src) 98 F (36.7 C) (Oral)  Resp 18  SpO2 98% Physical Exam  Nursing note and vitals reviewed. Constitutional: She is oriented to person, place, and time. Vital signs are normal. She appears well-developed and well-nourished. No distress.  HENT:  Head: Normocephalic and atraumatic.  Right Ear: Tympanic membrane, external ear and ear canal normal.  Left Ear: External ear and ear canal  normal. Tympanic membrane is retracted.  Neck: Normal range of motion. Neck supple.  Cardiovascular: Normal rate, regular rhythm and normal heart sounds.  Exam reveals no gallop and no friction rub.   No murmur heard. Pulmonary/Chest: Effort normal and breath sounds normal. No respiratory distress. She has no wheezes. She has no rales.  Lymphadenopathy:    She has no cervical adenopathy.  Neurological: She is alert and oriented to person, place, and time. She has normal strength. Coordination normal.  Skin: Skin is warm and dry. No rash noted. She is not diaphoretic.  Psychiatric: She has a normal mood and affect. Judgment normal.    ED Course  Procedures (including critical care time) Labs Review Labs Reviewed - No data to display Imaging Review No results found.   MDM   1. Otalgia of left ear   2. Eustachian tube dysfunction    Retracted left TM, likely due to eustachian tube dysfunction resulting from her cold. Treating with Flonase and a Medrol Dosepak. Advised to take ibuprofen as needed for pain. She declines Auralgan. Followup as needed if worsening or not improving in a week  Meds ordered this encounter  Medications  . fluticasone (FLONASE) 50 MCG/ACT nasal spray    Sig: Place 2 sprays into both nostrils 2 (two) times daily.    Dispense:  16 g    Refill:  2    Order Specific Question:  Supervising Provider    Answer:  Clementeen GrahamOREY, EVAN, S [id number]]  . methylPREDNISolone (MEDROL DOSEPAK) 4 MG tablet    Sig: follow package directions    Dispense:  21 tablet    Refill:  0    Order Specific Question:  Supervising Provider    Answer:  Clementeen GrahamOREY, EVAN, Kathie RhodesS [[id number]]     Graylon GoodZachary H Cheyan Frees, PA-C 11/15/13 1200

## 2013-11-15 NOTE — ED Notes (Signed)
C/o onst 3-5 onset cough, congestion, yellow tinted nasal secretions, pain in left ear pain

## 2013-11-19 NOTE — ED Provider Notes (Signed)
Medical screening examination/treatment/procedure(s) were performed by a resident physician or non-physician practitioner and as the supervising physician I was immediately available for consultation/collaboration.  Deasia Chiu, MD    Denece Shearer S Joee Iovine, MD 11/19/13 1354 

## 2014-01-03 ENCOUNTER — Emergency Department (HOSPITAL_COMMUNITY)
Admission: EM | Admit: 2014-01-03 | Discharge: 2014-01-03 | Disposition: A | Discharge status: Home / Self Care | Payer: Managed Care, Other (non HMO) | Source: Home / Self Care | Attending: Family Medicine | Admitting: Family Medicine

## 2014-01-03 ENCOUNTER — Encounter (HOSPITAL_COMMUNITY): Payer: Self-pay | Admitting: Emergency Medicine

## 2014-01-03 ENCOUNTER — Emergency Department (INDEPENDENT_AMBULATORY_CARE_PROVIDER_SITE_OTHER): Payer: Managed Care, Other (non HMO)

## 2014-01-03 DIAGNOSIS — J309 Allergic rhinitis, unspecified: Secondary | ICD-10-CM

## 2014-01-03 DIAGNOSIS — J302 Other seasonal allergic rhinitis: Secondary | ICD-10-CM

## 2014-01-03 MED ORDER — METHYLPREDNISOLONE ACETATE 80 MG/ML IJ SUSP
INTRAMUSCULAR | Status: AC
  Filled 2014-01-03: qty 1

## 2014-01-03 MED ORDER — DEXTROMETHORPHAN POLISTIREX 30 MG/5ML PO LQCR: 60.0000 mg | Freq: Two times a day (BID) | ORAL | Status: DC

## 2014-01-03 MED ORDER — IPRATROPIUM BROMIDE 0.06 % NA SOLN: 2.0000 | Freq: Four times a day (QID) | NASAL | Status: DC

## 2014-01-03 MED ORDER — CETIRIZINE HCL 10 MG PO TABS: 10.0000 mg | ORAL_TABLET | Freq: Every day | ORAL | Status: DC

## 2014-01-03 MED ORDER — METHYLPREDNISOLONE ACETATE 40 MG/ML IJ SUSP
80.0000 mg | Freq: Once | INTRAMUSCULAR | Status: AC
  Administered 2014-01-03: 80 mg via INTRAMUSCULAR

## 2014-01-03 NOTE — ED Provider Notes (Signed)
CSN: [id number]     Arrival date & time 01/03/14  0801 History   First MD Initiated Contact with Patient 01/03/14 0815     Chief Complaint  Patient presents with  . Cough   (Consider location/radiation/quality/duration/timing/severity/associated sxs/prior Treatment) Patient is a 58 y.o. female presenting with cough. The history is provided by the patient.  Cough Cough characteristics:  Non-productive, barking and dry Severity:  Moderate Onset quality:  Gradual Duration:  5 days Progression:  Unchanged Chronicity:  New Smoker: no   Context: weather changes   Ineffective treatments:  Cough suppressants Associated symptoms: chills, rhinorrhea and sinus congestion   Associated symptoms: no fever, no shortness of breath, no sore throat and no wheezing     Past Medical History  Diagnosis Date  . Acid reflux   . Hypercholesteremia   . Thyroid disease   . Thyroid nodule   . H/O hiatal hernia   . Headache(784.0)     migraines  . Arthritis     back and neck   Past Surgical History  Procedure Laterality Date  . Abdominal hysterectomy    . Cholecystectomy    . Ectopic pregnancy surgery    . Eye surgery      lasik  . Eus  12/21/2011    Procedure: ESOPHAGEAL ENDOSCOPIC ULTRASOUND (EUS) RADIAL;  Surgeon: Willis ModenaWilliam Outlaw, MD;  Location: [facility];  Service: Endoscopy;  Laterality: N/A;   History reviewed. No pertinent family history. History  Substance Use Topics  . Smoking status: Never Smoker   . Smokeless tobacco: Never Used  . Alcohol Use: Yes     Comment: occasionally   OB History   Grav Para Term Preterm Abortions TAB SAB Ect Mult Living                 Review of Systems  Constitutional: Positive for chills. Negative for fever.  HENT: Positive for congestion, postnasal drip and rhinorrhea. Negative for sore throat.   Respiratory: Positive for cough. Negative for shortness of breath and wheezing.     Allergies  Morphine and related and Sulfa antibiotics  Home  Medications   Prior to Admission medications   Medication Sig Start Date End Date Taking? Authorizing Provider  Calcium Carbonate (CALTRATE 600 PO) Take 600 mg by mouth daily.   Yes Historical Provider, MD  Multiple Vitamin (MULTIVITAMIN) tablet Take 1 tablet by mouth daily.   Yes Historical Provider, MD  omeprazole (PRILOSEC) 40 MG capsule Take 40 mg by mouth daily.   Yes Historical Provider, MD  vitamin C (ASCORBIC ACID) 500 MG tablet Take 500 mg by mouth daily.   Yes Historical Provider, MD  Cyanocobalamin (VITAMIN B-12) 1000 MCG SUBL Place 1,000 mcg under the tongue daily.    Historical Provider, MD  fluticasone (FLONASE) 50 MCG/ACT nasal spray Place 2 sprays into both nostrils 2 (two) times daily. 11/15/13   Graylon GoodZachary H Baker, PA-C  methylPREDNISolone (MEDROL DOSEPAK) 4 MG tablet follow package directions 11/15/13   Graylon GoodZachary H Baker, PA-C  sucralfate (CARAFATE) 1 G tablet Take 1 g by mouth daily.    Historical Provider, MD   BP 126/82  Pulse 90  Temp(Src) 99.8 F (37.7 C) (Oral)  Resp 18  SpO2 97% Physical Exam  Nursing note and vitals reviewed. Constitutional: She is oriented to person, place, and time. She appears well-developed and well-nourished.  HENT:  Head: Normocephalic.  Right Ear: External ear normal.  Left Ear: External ear normal.  Nose: Mucosal edema and rhinorrhea present.  Mouth/Throat:  Oropharynx is clear and moist.  Neck: Normal range of motion. Neck supple.  Cardiovascular: Regular rhythm and normal heart sounds.   Pulmonary/Chest: Effort normal and breath sounds normal.  Lymphadenopathy:    She has no cervical adenopathy.  Neurological: She is alert and oriented to person, place, and time.  Skin: Skin is warm and dry.    ED Course  Procedures (including critical care time) Labs Review Labs Reviewed - No data to display  Imaging Review Dg Chest 2 View  01/03/2014   CLINICAL DATA:  Cough.  EXAM: CHEST  2 VIEW  COMPARISON:  October 23, 2007.  FINDINGS: The  heart size and mediastinal contours are within normal limits. Both lungs are clear. No pneumothorax or pleural effusion is noted. The visualized skeletal structures are unremarkable.  IMPRESSION: No acute cardiopulmonary abnormality seen.   Electronically Signed   By: Roque LiasJames  Green M.D.   On: 01/03/2014 08:44   X-rays reviewed and report per radiologist.   MDM   1. Seasonal allergic rhinitis        Linna HoffJames D Kindl, MD 01/03/14 [phone]

## 2014-01-03 NOTE — ED Notes (Signed)
Barking cough, congestion x 5 days; unable to auscultate wheezing or rales

## 2014-01-03 NOTE — Discharge Instructions (Signed)
Drink plenty of fluids as discussed, use medicine as prescribed, and mucinex for cough. Return or see your doctor if further problems °

## 2014-02-24 ENCOUNTER — Encounter (HOSPITAL_COMMUNITY): Payer: Self-pay | Admitting: Emergency Medicine

## 2014-02-24 ENCOUNTER — Emergency Department (HOSPITAL_COMMUNITY)
Admission: EM | Admit: 2014-02-24 | Discharge: 2014-02-24 | Disposition: A | Discharge status: Home / Self Care | Payer: Managed Care, Other (non HMO) | Attending: Emergency Medicine | Admitting: Emergency Medicine

## 2014-02-24 ENCOUNTER — Emergency Department (HOSPITAL_COMMUNITY): Payer: Managed Care, Other (non HMO)

## 2014-02-24 DIAGNOSIS — E7800 Pure hypercholesterolemia, unspecified: Secondary | ICD-10-CM | POA: Insufficient documentation

## 2014-02-24 DIAGNOSIS — G44039 Episodic paroxysmal hemicrania, not intractable: Secondary | ICD-10-CM

## 2014-02-24 DIAGNOSIS — Z8739 Personal history of other diseases of the musculoskeletal system and connective tissue: Secondary | ICD-10-CM | POA: Insufficient documentation

## 2014-02-24 DIAGNOSIS — M2550 Pain in unspecified joint: Secondary | ICD-10-CM

## 2014-02-24 DIAGNOSIS — Z79899 Other long term (current) drug therapy: Secondary | ICD-10-CM | POA: Insufficient documentation

## 2014-02-24 DIAGNOSIS — M2559 Pain in other specified joint: Secondary | ICD-10-CM | POA: Insufficient documentation

## 2014-02-24 DIAGNOSIS — Z8719 Personal history of other diseases of the digestive system: Secondary | ICD-10-CM | POA: Insufficient documentation

## 2014-02-24 LAB — I-STAT CHEM 8, ED
BUN: 12 mg/dL (ref 6–23)
Calcium, Ion: 1.31 mmol/L — ABNORMAL HIGH (ref 1.12–1.23)
Chloride: 103 mEq/L (ref 96–112)
Creatinine, Ser: 0.8 mg/dL (ref 0.50–1.10)
Glucose, Bld: 84 mg/dL (ref 70–99)
HCT: 44 % (ref 36.0–46.0)
Hemoglobin: 15 g/dL (ref 12.0–15.0)
Potassium: 4 mEq/L (ref 3.7–5.3)
Sodium: 144 mEq/L (ref 137–147)
TCO2: 24 mmol/L (ref 0–100)

## 2014-02-24 LAB — CBC WITH DIFFERENTIAL/PLATELET
Basophils Absolute: 0 10*3/uL (ref 0.0–0.1)
Basophils Relative: 0 % (ref 0–1)
Eosinophils Absolute: 0.1 10*3/uL (ref 0.0–0.7)
Eosinophils Relative: 2 % (ref 0–5)
HCT: 41.1 % (ref 36.0–46.0)
Hemoglobin: 14 g/dL (ref 12.0–15.0)
Lymphocytes Relative: 26 % (ref 12–46)
Lymphs Abs: 1.5 10*3/uL (ref 0.7–4.0)
MCH: 27.9 pg (ref 26.0–34.0)
MCHC: 34.1 g/dL (ref 30.0–36.0)
MCV: 82 fL (ref 78.0–100.0)
Monocytes Absolute: 0.4 10*3/uL (ref 0.1–1.0)
Monocytes Relative: 7 % (ref 3–12)
Neutro Abs: 3.8 10*3/uL (ref 1.7–7.7)
Neutrophils Relative %: 65 % (ref 43–77)
Platelets: 242 10*3/uL (ref 150–400)
RBC: 5.01 MIL/uL (ref 3.87–5.11)
RDW: 13.5 % (ref 11.5–15.5)
WBC: 5.9 10*3/uL (ref 4.0–10.5)

## 2014-02-24 LAB — SEDIMENTATION RATE: Sed Rate: 7 mm/hr (ref 0–22)

## 2014-02-24 NOTE — ED Notes (Signed)
P c/o off and on left sided numbness, arm and leg pain x 1 week. Pt also c/o head pain and weakness x 2 weeks went to PCP. Denies n/v

## 2014-02-24 NOTE — ED Provider Notes (Signed)
CSN: [id number]     Arrival date & time 02/24/14  [id number] History   First MD Initiated Contact with Patient 02/24/14 [phone]     Chief Complaint  Patient presents with  . Numbness  . Headache  . Weakness  . Dizziness     (Consider location/radiation/quality/duration/timing/severity/associated sxs/prior Treatment) HPI Comments: Patient presents with headache and blurry vision. She states that the last 3-4 week she's had some shooting pains in her left temporal area. They usually last a minute or 2 and then go away. She states it seems like it has been worse over last 2 days since she's had some blurry vision in her left eye. She denies any facial numbness or drooping. She denies any speech deficits. She has seen her primary care doctor for the symptoms and she was told it was related to possible neuralgia. She does have some tenderness around her temporal area. She denies any pain with chewing. She also has a 3 to four-month history of some numbness in her left arm and her left leg. She denies any weakness in his areas. She has seen Dr. Jule SerNudleman with neurosurgery and has been told that she needs surgery on her neck. Per her report, it was felt that the numbness in her arm and her leg or do to her cervical disc disease. She states the numbness comes and goes. She at times has some ataxia.  Patient is a 58 y.o. female presenting with headaches, weakness, and dizziness.  Headache Associated symptoms: dizziness   Associated symptoms: no abdominal pain, no back pain, no congestion, no cough, no diarrhea, no fatigue, no fever, no nausea, no numbness and no vomiting   Weakness Associated symptoms include headaches. Pertinent negatives include no chest pain, no abdominal pain and no shortness of breath.  Dizziness Associated symptoms: headaches   Associated symptoms: no blood in stool, no chest pain, no diarrhea, no nausea, no shortness of breath and no vomiting     Past Medical History  Diagnosis Date   . Acid reflux   . Hypercholesteremia   . Thyroid disease   . Thyroid nodule   . H/O hiatal hernia   . Headache(784.0)     migraines  . Arthritis     back and neck   Past Surgical History  Procedure Laterality Date  . Abdominal hysterectomy    . Cholecystectomy    . Ectopic pregnancy surgery    . Eye surgery      lasik  . Eus  12/21/2011    Procedure: ESOPHAGEAL ENDOSCOPIC ULTRASOUND (EUS) RADIAL;  Surgeon: Willis ModenaWilliam Outlaw, MD;  Location: [facility];  Service: Endoscopy;  Laterality: N/A;   No family history on file. History  Substance Use Topics  . Smoking status: Never Smoker   . Smokeless tobacco: Never Used  . Alcohol Use: Yes     Comment: occasionally   OB History   Grav Para Term Preterm Abortions TAB SAB Ect Mult Living                 Review of Systems  Constitutional: Negative for fever, chills, diaphoresis and fatigue.  HENT: Negative for congestion, rhinorrhea and sneezing.   Eyes: Negative.   Respiratory: Negative for cough, chest tightness and shortness of breath.   Cardiovascular: Negative for chest pain and leg swelling.  Gastrointestinal: Negative for nausea, vomiting, abdominal pain, diarrhea and blood in stool.  Genitourinary: Negative for frequency, hematuria, flank pain and difficulty urinating.  Musculoskeletal: Positive for arthralgias. Negative for back pain.  Skin: Negative for rash.  Neurological: Positive for dizziness and headaches. Negative for speech difficulty, weakness and numbness.      Allergies  Morphine and related and Sulfa antibiotics  Home Medications   Prior to Admission medications   Medication Sig Start Date End Date Taking? Authorizing Provider  Cyanocobalamin (VITAMIN B-12) 1000 MCG SUBL Place 1,000 mcg under the tongue daily.   Yes Historical Provider, MD   BP 126/76  Pulse 88  Temp(Src) 98 F (36.7 C) (Oral)  Resp 20  SpO2 99% Physical Exam  Constitutional: She is oriented to person, place, and time. She  appears well-developed and well-nourished.  HENT:  Head: Normocephalic and atraumatic.  Eyes: Pupils are equal, round, and reactive to light.  Neck: Normal range of motion. Neck supple.  Cardiovascular: Normal rate, regular rhythm and normal heart sounds.   Pulmonary/Chest: Effort normal and breath sounds normal. No respiratory distress. She has no wheezes. She has no rales. She exhibits no tenderness.  Abdominal: Soft. Bowel sounds are normal. There is no tenderness. There is no rebound and no guarding.  Musculoskeletal: Normal range of motion. She exhibits no edema.  Lymphadenopathy:    She has no cervical adenopathy.  Neurological: She is alert and oriented to person, place, and time. She has normal strength. No cranial nerve deficit or sensory deficit. GCS eye subscore is 4. GCS verbal subscore is 5. GCS motor subscore is 6.  FTN intact, gait normal, no pronator drift  Skin: Skin is warm and dry. No rash noted.  Psychiatric: She has a normal mood and affect.    ED Course  Procedures (including critical care time) Labs Review Labs Reviewed  I-STAT CHEM 8, ED - Abnormal; Notable for the following:    Calcium, Ion 1.31 (*)    All other components within normal limits  SEDIMENTATION RATE  CBC WITH DIFFERENTIAL    Imaging Review Ct Head Wo Contrast  02/24/2014   CLINICAL DATA:  Headache.  EXAM: CT HEAD WITHOUT CONTRAST  TECHNIQUE: Contiguous axial images were obtained from the base of the skull through the vertex without intravenous contrast.  COMPARISON:  CT scan of May 31, 2012.  FINDINGS: Bony calvarium appears intact. No mass effect or midline shift is noted. Ventricular size is within normal limits. There is no evidence of mass lesion, hemorrhage or acute infarction.  IMPRESSION: Normal head CT.   Electronically Signed   By: Roque LiasJames  Green M.D.   On: 02/24/2014 10:24     EKG Interpretation None      MDM   Final diagnoses:  Episodic paroxysmal hemicrania, not  intractable    Patient presents with left-sided headache and left sided blurry vision. She had a normal head CT. She currently denies headache. She doesn't have any significant vision loss on exam. She has ongoing chronic numbness in her left arm and left leg which is unchanged. She has no ataxia or extremity weakness that would be more suggestive of a stroke. Her sedimentation rate is normal which would make temporal arteritis less likely. She was discharged home in good condition and I encouraged her to have close followup with her primary care physician.    Rolan BuccoMelanie Raveen Wieseler, MD 02/24/14 [phone]

## 2014-02-24 NOTE — ED Notes (Signed)
Pt c/o lightheadedness as well as sharp shooting pains on left side of her head for couple of weeks. Pt states that he PCP thought it was neuralgia. Pt states that she was on way to work but felt uneasy about the persistent numbness on her left side.  Pt states that he left knee feels like it is huge due to the numbness.

## 2014-03-06 ENCOUNTER — Other Ambulatory Visit: Payer: Self-pay

## 2014-03-06 DIAGNOSIS — Z1231 Encounter for screening mammogram for malignant neoplasm of breast: Secondary | ICD-10-CM

## 2014-04-03 ENCOUNTER — Encounter (INDEPENDENT_AMBULATORY_CARE_PROVIDER_SITE_OTHER): Payer: Self-pay

## 2014-04-03 ENCOUNTER — Ambulatory Visit
Admission: RE | Admit: 2014-04-03 | Discharge: 2014-04-03 | Disposition: A | Discharge status: Home / Self Care | Payer: Managed Care, Other (non HMO) | Source: Ambulatory Visit

## 2014-04-03 DIAGNOSIS — Z1231 Encounter for screening mammogram for malignant neoplasm of breast: Secondary | ICD-10-CM

## 2014-05-16 ENCOUNTER — Encounter: Payer: Self-pay | Admitting: *Deleted

## 2014-06-02 ENCOUNTER — Other Ambulatory Visit (HOSPITAL_COMMUNITY): Payer: Self-pay | Admitting: Orthopaedic Surgery

## 2014-06-02 ENCOUNTER — Ambulatory Visit (HOSPITAL_COMMUNITY)
Admission: RE | Admit: 2014-06-02 | Discharge: 2014-06-02 | Disposition: A | Discharge status: Home / Self Care | Payer: Managed Care, Other (non HMO) | Source: Ambulatory Visit | Attending: Orthopaedic Surgery | Admitting: Orthopaedic Surgery

## 2014-06-02 DIAGNOSIS — M79604 Pain in right leg: Secondary | ICD-10-CM

## 2014-06-02 DIAGNOSIS — M79609 Pain in unspecified limb: Secondary | ICD-10-CM | POA: Diagnosis not present

## 2014-06-02 NOTE — Progress Notes (Signed)
*  PRELIMINARY RESULTS* Vascular Ultrasound Right lower extremity venous duplex has been completed.  Preliminary findings: no evidence of DVT.  Called results to [place].    Farrel Demark, RDMS, RVT  06/02/2014, 4:39 PM

## 2014-06-18 ENCOUNTER — Other Ambulatory Visit: Payer: Self-pay | Admitting: Obstetrics and Gynecology

## 2014-06-19 LAB — CYTOLOGY - PAP

## 2014-10-27 ENCOUNTER — Other Ambulatory Visit (INDEPENDENT_AMBULATORY_CARE_PROVIDER_SITE_OTHER): Payer: Self-pay | Admitting: Surgery

## 2015-01-13 ENCOUNTER — Ambulatory Visit (HOSPITAL_BASED_OUTPATIENT_CLINIC_OR_DEPARTMENT_OTHER): Payer: Managed Care, Other (non HMO)

## 2015-02-12 ENCOUNTER — Other Ambulatory Visit: Payer: Self-pay | Admitting: Surgery

## 2015-02-12 DIAGNOSIS — E042 Nontoxic multinodular goiter: Secondary | ICD-10-CM

## 2015-02-13 ENCOUNTER — Other Ambulatory Visit: Payer: Self-pay | Admitting: *Deleted

## 2015-02-16 ENCOUNTER — Ambulatory Visit
Admission: RE | Admit: 2015-02-16 | Discharge: 2015-02-16 | Disposition: A | Discharge status: Home / Self Care | Payer: Managed Care, Other (non HMO) | Source: Ambulatory Visit | Attending: Surgery | Admitting: Surgery

## 2015-02-20 DIAGNOSIS — Z6830 Body mass index (BMI) 30.0-30.9, adult: Secondary | ICD-10-CM | POA: Insufficient documentation

## 2015-03-05 ENCOUNTER — Encounter: Payer: Self-pay | Admitting: Neurology

## 2015-03-05 ENCOUNTER — Ambulatory Visit (INDEPENDENT_AMBULATORY_CARE_PROVIDER_SITE_OTHER): Payer: Managed Care, Other (non HMO) | Admitting: Neurology

## 2015-03-05 VITALS — BP 140/80 | HR 86 | Resp 20 | Ht 66.0 in | Wt 194.0 lb

## 2015-03-05 DIAGNOSIS — R42 Dizziness and giddiness: Secondary | ICD-10-CM

## 2015-03-05 DIAGNOSIS — R519 Headache, unspecified: Secondary | ICD-10-CM

## 2015-03-05 DIAGNOSIS — R51 Headache: Secondary | ICD-10-CM

## 2015-03-05 DIAGNOSIS — G500 Trigeminal neuralgia: Secondary | ICD-10-CM

## 2015-03-05 DIAGNOSIS — R253 Fasciculation: Secondary | ICD-10-CM | POA: Diagnosis not present

## 2015-03-05 DIAGNOSIS — G4719 Other hypersomnia: Secondary | ICD-10-CM

## 2015-03-05 DIAGNOSIS — R55 Syncope and collapse: Secondary | ICD-10-CM | POA: Diagnosis not present

## 2015-03-05 DIAGNOSIS — G47411 Narcolepsy with cataplexy: Secondary | ICD-10-CM

## 2015-03-05 DIAGNOSIS — R0683 Snoring: Secondary | ICD-10-CM

## 2015-03-05 NOTE — Progress Notes (Addendum)
[facility]    Provider:  Dr Lucia Gaskins Referring Provider: Laurena Slimmer, MD Primary Care Physician:  Laurena Slimmer, MD  CC:  Pre-syncope  HPI:  [name] [name] is a 59 y.o. female here as a referral from Dr. Chestine Spore for imbalance. PMHx of HTN, Diabetes, HLD, breast cancer, thyroid disease, migraines, arthritis.   She has been feeling imbalance like she is going to "drop". She has had some sharp pains shooting through the right (points to the v1 facial dermatome), weak and tired too. Symptoms started over a month ago. No medications changes. No inciting factors. Dizziness, lightheadedness happens when she is walking, when she stands and starts to move,  She also gets body jerks. Getting worse. She gets the jerks every day, twice daily. It is her body, she is walking, the legs get weak and she slows down and tries to grab the wall. No CP. She has SOB on exertion. Off and on, she gets feelings like the heart is fluttering. The dizziness and the jerks occur at the same time. The jerks happen when she is off balance. She is continuously off balance with light headedness. She feels like she is in a tunnel and "gonna pass out" with heart fluttering. No falls.  When she is laughing very hard she feels like she is going to pass out, like she gets very weak and is going to fall, los sof tone in the body and she physically has to hold onto something or sit down or she will fall. She endorses sleep paralysis episodes but not in the last month. She is excessively tired after lunch, she always thought it was post lunch tiredness, because of eating food but she gets so tired she can't keep her eyes open. She snores "terribly". She has morning headaches and nocturnal headaches. Her husband tries to wake her and shake her to stop her from snoring.  Reviewed notes, labs and imaging from outside physicians, which showed:   CT if the head showed No acute intracranial abnormalities including mass  lesion or mass effect, hydrocephalus, extra-axial fluid collection, midline shift, hemorrhage, or acute infarction, large ischemic events (personally reviewed images).  MRI of the cervical cord: degenerative spondylosis of c3-c4 with mild foraminal narrowing bilaterally and also at c5-c6 with possible bilateral c6 nerve root encroachment.   Cbc,bmp,tsh unremarkable except for elevated ca   Review of Systems: Patient complains of symptoms per HPI as well as the following symptoms: fatigue, blurred vision, SOB, chest pain, swelling in legs, hearing loss, snoring, joint pain, memory loss, numbness, weakness, dizziness, tremor, decreased energy, snoring. Pertinent negatives per HPI. All others negative.   History   Social History  . Marital Status: Married    Spouse Name: N/A  . Number of Children: N/A  . Years of Education: N/A   Occupational History  . Not on file.   Social History Main Topics  . Smoking status: Never Smoker   . Smokeless tobacco: Never Used  . Alcohol Use: Yes     Comment: occasionally  . Drug Use: No  . Sexual Activity: Yes    Birth Control/ Protection: None   Other Topics Concern  . Not on file   Social History Narrative   Married.   Drinks occasional sweet tea.   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    Family History  Problem Relation Age of Onset  . Colon cancer Mother   . Pancreatic cancer Brother     Past Medical  History  Diagnosis Date  . Acid reflux   . Hypercholesteremia   . Thyroid disease   . Thyroid nodule   . H/O hiatal hernia   . Headache(784.0)     migraines  . Arthritis     back and neck    Past Surgical History  Procedure Laterality Date  . Abdominal hysterectomy    . Cholecystectomy    . Ectopic pregnancy surgery    . Eye surgery      lasik  . Eus  12/21/2011    Procedure: ESOPHAGEAL ENDOSCOPIC ULTRASOUND (EUS) RADIAL;  Surgeon: Willis Modena, MD;  Location: [facility];  Service: Endoscopy;   Laterality: N/A;    Current Outpatient Prescriptions  Medication Sig Dispense Refill  . Cyanocobalamin (VITAMIN B-12) 1000 MCG SUBL Place 1,000 mcg under the tongue daily.    Marland Kitchen FISH OIL-KRILL OIL PO Take by mouth.     No current facility-administered medications for this visit.    Allergies as of 03/05/2015 - Review Complete 03/05/2015  Allergen Reaction Noted  . Morphine and related Shortness Of Breath 12/20/2011  . Sulfa antibiotics Hives 12/20/2011    Vitals: BP 140/80 mmHg  Pulse 86  Resp 20  Ht  (1.676 m)  Wt 194 lb (87.998 kg)  BMI 31.33 kg/m2 Last Weight:  Wt Readings from Last 1 Encounters:  03/05/15 194 lb (87.998 kg)   Last Height:   Ht Readings from Last 1 Encounters:  03/05/15  (1.676 m)   Physical exam: Exam: Gen: NAD, conversant, well nourised, well groomed                     CV: RRR, no MRG. No Carotid Bruits. No peripheral edema, warm, nontender Eyes: Conjunctivae clear without exudates or hemorrhage  Neuro: Detailed Neurologic Exam  Speech:    Speech is normal; fluent and spontaneous with normal comprehension.  Cognition:    The patient is oriented to person, place, and time;     recent and remote memory intact;     language fluent;     normal attention, concentration,     fund of knowledge Cranial Nerves:    The pupils are equal, round, and reactive to light. The fundi are normal and spontaneous venous pulsations are present. Visual fields are full to finger confrontation. Extraocular movements are intact. Trigeminal sensation is intact and the muscles of mastication are normal. The face is symmetric. The palate elevates in the midline. Hearing intact. Voice is normal. Shoulder shrug is normal. The tongue has normal motion without fasciculations.   Coordination:    Normal finger to nose and heel to shin. Normal rapid alternating movements.   Gait:    Heel-toe and tandem gait are normal.   Motor Observation:    No asymmetry, no  atrophy, and no involuntary movements noted. Tone:    Normal muscle tone.    Posture:    Posture is normal. normal erect    Strength:    Strength is V/V in the upper and lower limbs.      Sensation: intact to LT     Reflex Exam:  DTR's:    Deep tendon reflexes in the upper and lower extremities are normal bilaterally.   Toes:    The toes are downgoing bilaterally.   Clonus:    Clonus is absent.    Assessment/Plan:  59 year old female with feeling of lightheadedness and imbalance, feelings she is going to "drop". Endorses cataplexy, sleep paralysis. Also  possibly some presyncopal episodes with feeling like she is in a tunnel and is "gonna pass out" along with heart fluttering.   Sleep Study for OSA and narcolepsy: She also endorses heavy snoring, excessive daytime drowsiness (ESS 20), strong feelings to go to sleep during the day, cataplexy and sleep paralysis. Morning headache.  Will also refer for cardiovascular workup for presyncopal episodes. should also f/u with pcp for elevated calcium  Will order MRI  of the brain   EEG for reported jerking movements.  Addendum, transthoracic echocardiogram study report shows that left ventricle cavity is normal in size. Mild concentric hypertrophy of the left ventricle. Normal global wall motion. Normal diastolic filling pattern. Left atrial cavity is normal in size. Right atrial cavity is normal in size. Right ventricle cavity is normal in size. Normal right ventricular function. Structurally normal trileaflet aortic valve with no regurgitation noted. Mild mitral regurgitation. Mild tricuspid regurgitation. No evidence of pulmonary hypertension. Trace pulmonic resuscitation. No evidence of significant pericardial effusion. The aortic root is normal. Normal pulmonary artery. IVC is normal with respiratory variation. Tricuspid regurgitation most probable source of systolic murmur. Study completed 04/15/2015.   Naomie DeanAntonia Ahern, MD  [facility] [address] [place], [place] [id number]  Phone [phone] Fax [phone]

## 2015-03-05 NOTE — Patient Instructions (Signed)
Remember to drink plenty of fluid, eat healthy meals and do not skip any meals. Try to eat protein with a every meal and eat a healthy snack such as fruit or nuts in between meals. Try to keep a regular sleep-wake schedule and try to exercise daily, particularly in the form of walking, 20-30 minutes a day, if you can.   As far as diagnostic testing: Sleep test, MRi of the brain  Our phone number is [phone]. We also have an after hours call service for urgent matters and there is a physician on-call for urgent questions. For any emergencies you know to call 911 or go to the nearest emergency room

## 2015-03-10 ENCOUNTER — Telehealth: Payer: Self-pay | Admitting: *Deleted

## 2015-03-10 NOTE — Telephone Encounter (Signed)
Scheduled appt w/ pt for sleep consult on 03/25/15 at 9:30am with Dr. Vickey Hugerohmeier. Told pt to arrive at 9:15am. Pt verbalized understanding.

## 2015-03-17 ENCOUNTER — Ambulatory Visit (INDEPENDENT_AMBULATORY_CARE_PROVIDER_SITE_OTHER): Payer: Managed Care, Other (non HMO) | Admitting: Neurology

## 2015-03-17 DIAGNOSIS — R253 Fasciculation: Secondary | ICD-10-CM

## 2015-03-17 NOTE — Procedures (Signed)
    History: [name] MarkerSheila [name] is a 59 year old patient with a history of episodes of lightheadedness, a sensation as if she is going to "dropped". She has feelings of fluttering of the heart. She is being evaluated for these events.  This is a routine EEG. No skull defects are noted. Medications include vitamin B12, and fish oil.  EEG classification: Dysrhythmia grade 1 left temporal  Description of the recording: The background rhythms of this recording consists of a well modulated medium amplitude alpha rhythm of 10 Hz that is reactive to eye opening and closure. As the record progresses, photic stimulation is performed, and this results in a bilateral photic driving response. Hyperventilation is also performed resulting in a minimal buildup of the background rhythm activities without significant slowing seen. Intermittently during the recording, the patient appears to have frequent episodes of transient drowsiness, never clearly entering stage II sleep with episodes of generalized slowing. On occasion, there is asymmetric slowing briefly in the left temporal region. At no time during the recording does there appear to be evidence of actual spike or spike-wave discharges. EKG monitor shows no evidence of cardiac rhythm abnormalities with a heart rate of 66.  Impression: This is a mildly abnormal EEG recording secondary to intermittent theta slowing emanating from the left temporal area. This suggests a mild left brain abnormality, no clear epileptiform discharges are seen. The patient appears to have rapid transitions into the drowsy state off and on throughout the recording.

## 2015-03-18 ENCOUNTER — Ambulatory Visit (INDEPENDENT_AMBULATORY_CARE_PROVIDER_SITE_OTHER): Payer: Managed Care, Other (non HMO)

## 2015-03-18 DIAGNOSIS — G500 Trigeminal neuralgia: Secondary | ICD-10-CM

## 2015-03-18 DIAGNOSIS — R55 Syncope and collapse: Secondary | ICD-10-CM

## 2015-03-18 DIAGNOSIS — R42 Dizziness and giddiness: Secondary | ICD-10-CM

## 2015-03-19 ENCOUNTER — Telehealth: Payer: Self-pay | Admitting: Neurology

## 2015-03-19 MED ORDER — GADOPENTETATE DIMEGLUMINE 469.01 MG/ML IV SOLN: 18.0000 mL | Freq: Once | INTRAVENOUS | Status: AC | PRN

## 2015-03-19 NOTE — Telephone Encounter (Signed)
Patient called and requested to speak with the nurse regarding her test results, please call and advise.

## 2015-03-19 NOTE — Telephone Encounter (Signed)
Please let patient know that the EEG did not show any clear seizure-like events but I would like to have an extended eeg performed, possibly after her having no sleep the night before. Please let her know that I will ask [name] [name] to call her on Tuesday to set it up. We schedule an extended eeg and put it on [name] [name]'s schedule in the morning time. [name] [name] will explain it all to patient when she calls next week. Thanks. Thanks.

## 2015-03-19 NOTE — Telephone Encounter (Signed)
Left message that test results (EEG) have not been resulted yet but we will call her back as soon as we know the results.

## 2015-03-19 NOTE — Telephone Encounter (Signed)
Left message to call back  

## 2015-03-20 NOTE — Telephone Encounter (Signed)
Patient called requesting to know MRI results asap before Tuesday.

## 2015-03-20 NOTE — Telephone Encounter (Signed)
I spoke to [name] HatchetSheila she is aware of EEG results and new recommendation below. She asked about MRI results but I do not see that they are in her chart yet. Patient knows to expect a call next week to set up sleep deprived EEG.

## 2015-03-20 NOTE — Telephone Encounter (Signed)
I advised patient that we will call her as soon as we know her results.

## 2015-03-20 NOTE — Telephone Encounter (Signed)
Pt called back about EEG , Would like to know results. Please call and advise. [phone]

## 2015-03-22 ENCOUNTER — Other Ambulatory Visit: Payer: Self-pay | Admitting: Neurology

## 2015-03-22 ENCOUNTER — Telehealth: Payer: Self-pay | Admitting: Neurology

## 2015-03-22 DIAGNOSIS — R259 Unspecified abnormal involuntary movements: Secondary | ICD-10-CM

## 2015-03-22 DIAGNOSIS — R9401 Abnormal electroencephalogram [EEG]: Secondary | ICD-10-CM

## 2015-03-22 NOTE — Telephone Encounter (Signed)
Spoke to patient. Will schedule a repeat extended EEG due to the intermittent theta slowing emanating from the left temporal area on routing eeg  Dr. Vickey Hugerohmeier - Patient is seeing you on the 20th for sleep eval. She has a home sleep test scheduled for OSA elsewhere but she would prefer to have it done here with us in our sleep lab overnight. Considering she has morning and nocturnal headaches, I thought maybe this could be approved as opposed to a home study. But what may be more interesting to you is that when she is laughing very hard she feels like she is going to pass out, she gets very weak and is going to fall, loss of tone in the body and she physically has to hold onto something or sit down or she will fall. She endorses sleep paralysis episodes and episodes of extreme urge to go to sleep during the day. EEG showed that the patient appeared to have rapid transitions into the drowsy state off and on throughout the recording.   Thanks.

## 2015-03-22 NOTE — Telephone Encounter (Signed)
Spoke to patient. MRI of the brain is normal. She acknowledged.  Faith - will you schedule patient for another eeg since Kara Meadmma will not be in the office until Wednesday please, an extended one-hour eeg first thing in the morning. I am going to ask patient to try and limit her sleep the night before if she can have someone drive her. If she can't, we won't limit sleep. thanks

## 2015-03-23 NOTE — Telephone Encounter (Signed)
Per Rushie GoltzFaith, she has requested Karin GoldenLorraine to set this appointment up for the patient.            Anson FretAntonia B Ahern, MD at 03/22/2015 12:12 PM     Status: Signed       Expand All Collapse All   Spoke to patient. MRI of the brain is normal. She acknowledged.  Faith - will you schedule patient for another eeg since Kara Meadmma will not be in the office until Wednesday please, an extended one-hour eeg first thing in the morning. I am going to ask patient to try and limit her sleep the night before if she can have someone drive her. If she can't, we won't limit sleep. thanks

## 2015-03-23 NOTE — Telephone Encounter (Signed)
See other task - combined calls.

## 2015-03-23 NOTE — Telephone Encounter (Signed)
I will be happy to set her up for an in lab study with expanded EEG and capnography- as long as she has a reasonable insurance allowing us to do our job.  Looking forward to see her- Can't be [employer] , [employer] or [employer] these days.

## 2015-03-23 NOTE — Telephone Encounter (Signed)
Per Rushie GoltzFaith, she has requested Karin GoldenLorraine to set this appointment up for the patient.

## 2015-03-25 ENCOUNTER — Encounter: Payer: Self-pay | Admitting: Neurology

## 2015-03-25 ENCOUNTER — Ambulatory Visit (INDEPENDENT_AMBULATORY_CARE_PROVIDER_SITE_OTHER): Payer: Managed Care, Other (non HMO) | Admitting: Neurology

## 2015-03-25 VITALS — BP 118/78 | HR 76 | Resp 20 | Ht 66.0 in | Wt 194.0 lb

## 2015-03-25 DIAGNOSIS — G4710 Hypersomnia, unspecified: Secondary | ICD-10-CM | POA: Diagnosis not present

## 2015-03-25 DIAGNOSIS — K2100 Gastro-esophageal reflux disease with esophagitis, without bleeding: Secondary | ICD-10-CM

## 2015-03-25 DIAGNOSIS — G47411 Narcolepsy with cataplexy: Secondary | ICD-10-CM | POA: Diagnosis not present

## 2015-03-25 DIAGNOSIS — G4730 Sleep apnea, unspecified: Secondary | ICD-10-CM

## 2015-03-25 DIAGNOSIS — R0683 Snoring: Secondary | ICD-10-CM | POA: Insufficient documentation

## 2015-03-25 NOTE — Progress Notes (Signed)
SLEEP MEDICINE CLINIC   Provider:  Melvyn Novas, M D  Referring Provider: Laurena Slimmer, MD Primary Care Physician:  Laurena Slimmer, MD  Chief Complaint  Patient presents with  . sleep consult    needs a sleep study, HST has been approved by [employer], possible narcolepsy, rm 11, alone    HPI:  [name] [name] is a 59 y.o. female  Is seen here as a referral  from Dr. Chestine Spore for and Dr. Naomie Dean for a narcolepsy evaluation.  [name] [name] is a right-handed African-American female who was originally reported referred by Dr. Chestine Spore to Dr. Naomie Dean was a question of reasons for in balance or balance problems. The patient reports that these are more likely to spells they're not constantly present but with initiation of movement she sometimes feels as if her legs buckle at times she gets short of breath with exertion but it is not everyday the same. She's not sure that there are any other triggers she has sometimes dizziness or lightheadedness with postural changes standing up from a seated position or beginning to walk. Sometimes she has felt as if her heart is fluttering. She also reports that when she laughs really hard her vision may get blurred and she feels as if she could faint. She had similar symptoms when she strains or performs any Valsalva maneuver. She states that when she is angry or otherwise excited she would usually not have the symptoms of leg buckling and getting lightheaded. Dr. Lucia Gaskins that when she is laughing very hard she feels like she may go to pass out she suspected that the patient may have cataplexy. She is also excessively tired after lunch times falls asleep inadvertently or has the irresistible urge to fall asleep. She has also reportedly snored very loudly and sometimes she has morning headaches or even headaches at night when she wakes up. Describes them as a global pressure sensation not migrainous in nature. Tried to not sure and shake her to stop her from  snoring any further.  Her sleep habits are as follows she has variable bedtimes anytime between 9:30 and 11:30. She rises in the morning 5 AM. He lies on an alarm to wake up in the morning. She doesn't always feel refreshed or restored. She actually rarely does. She describes no evidence for sleep paralysis or dream intrusion actually she's not sure if she dreams at all. She prefers to sleep on her left side, she always wakes up on her back. This is also the position she most likely snores . As been has not witnessed any acting out of dreams kicking and thrashing sleepwalking or sleep talking. There is also no childhood history of sleep disorders or family history.  She has an irresistible urge to sleep, drinks cold water, walks about the office, all in order to avoid falling asleep.  There will be some nights that she is extremely sleep deprived by the length of sleep provided. 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 She has no natural daylight in her office environment. She has gained some weight over the last years, but minimally - she can still wear the same jeans , but her midsection is larger.   Review of Systems: Out of a complete 14 system review, the patient complains of only the following symptoms, and all other reviewed systems are negative. Mrs. [name] [name] endorsed the Epworth sleepiness score at  18 points which is very high, fatigue severity was moderate at 33 points. She does not appear to be depressed stones she endorsed blurred vision fatigue palpitations swelling in the legs loud snoring aching muscles joint pain, some hearing loss, decreased level of energy, and dizziness-legs buckling.     History   Social History  . Marital Status: Married    Spouse Name: N/A  . Number of Children: N/A  . Years of Education: N/A   Occupational History  . Not on file.   Social History Main Topics  . Smoking  status: Never Smoker   . Smokeless tobacco: Never Used  . Alcohol Use: Yes     Comment: occasionally  . Drug Use: No  . Sexual Activity: Yes    Birth Control/ Protection: None   Other Topics Concern  . Not on file   Social History Narrative   Married.   Drinks occasional sweet tea.   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       Family History  Problem Relation Age of Onset  . Colon cancer Mother   . Pancreatic cancer Brother     Past Medical History  Diagnosis Date  . Acid reflux   . Hypercholesteremia   . Thyroid disease   . Thyroid nodule   . H/O hiatal hernia   . Headache(784.0)     migraines  . Arthritis     back and neck    Past Surgical History  Procedure Laterality Date  . Abdominal hysterectomy    . Cholecystectomy    . Ectopic pregnancy surgery    . Eye surgery      lasik  . Eus  12/21/2011    Procedure: ESOPHAGEAL ENDOSCOPIC ULTRASOUND (EUS) RADIAL;  Surgeon: Willis ModenaWilliam Outlaw, MD;  Location: [facility];  Service: Endoscopy;  Laterality: N/A;    Current Outpatient Prescriptions  Medication Sig Dispense Refill  . Cyanocobalamin (VITAMIN B-12) 1000 MCG SUBL Place 1,000 mcg under the tongue daily.    Marland Kitchen. FISH OIL-KRILL OIL PO Take by mouth.     No current facility-administered medications for this visit.    Allergies as of 03/25/2015 - Review Complete 03/25/2015  Allergen Reaction Noted  . Morphine and related Shortness Of Breath 12/20/2011  . Sulfa antibiotics Hives 12/20/2011    Vitals: BP 118/78 mmHg  Pulse 76  Resp 20  Ht 5\' 6"  (1.676 m)  Wt 194 lb (87.998 kg)  BMI 31.33 kg/m2 Last Weight:  Wt Readings from Last 1 Encounters:  03/25/15 194 lb (87.998 kg)       Last Height:   Ht Readings from Last 1 Encounters:  03/25/15 5\' 6"  (1.676 m)    Physical exam:  General: The patient is awake, alert and appears not in acute distress. The patient is well groomed. Head: Normocephalic, atraumatic. Neck is supple. Mallampati  3, elongated and red uvula in midline.  neck circumference:16.25 . Nasal airflow unrestricted , TMJ not evident . Retrognathia is not een. , there is a goiter.  Cardiovascular:  Regular rate and rhythm, without  murmurs or carotid bruit, and without distended neck veins. Respiratory: Lungs are clear to auscultation. Skin:  Without evidence of edema, or rash Trunk: BMI iselevated and patient  has normal posture.  Neurologic exam : The patient is awake and alert, oriented to place and time.   Memory subjective  described as intact. There is a normal attention span & concentration ability. Speech is fluent without dysarthria, dysphonia or aphasia. Mood  and affect are appropriate. A shunt reports that multitasking has been harder because of her high degree of sleepiness and fatigue.  Cranial nerves: Pupils are equal and briskly reactive to light. Neovascularization over the right eye at 6 o'clock  Funduscopic exam without  evidence of pallor or edema. Extraocular movements  in vertical and horizontal planes intact and without nystagmus. Visual fields by finger perimetry are intact. Hearing to finger rub intact.  Facial sensation intact to fine touch. Facial motor strength is symmetric and tongue and uvula move midline.  Motor exam:   Normal tone ,muscle bulk and symmetric ,strength in all extremities. Sensory:  Fine touch, pinprick and vibration were tested in all extremities. Proprioception is  normal. Coordination: Rapid alternating movements in the fingers/hands is normal. Finger-to-nose maneuver  normal without evidence of ataxia, dysmetria or tremor.  Gait and station: Patient walks without assistive device and is able unassisted to climb up to the exam table.  Strength within normal limits. Stance is stable and normal.   Deep tendon reflexes: in the  upper and lower extremities are symmetric and intact. Babinski maneuver response is downgoing.   Assessment:  After physical and neurologic  examination, review of laboratory studies, imaging, neurophysiology testing and pre-existing records, assessment is    The patient's original referral was for balance problems and she reports drifting towards the right side been feeling dizzy or lightheaded. She also has a perception that her limbs are puffy or swollen and she feels that she has lost muscle tone and perhaps muscle mass. Back to the sleep related issues Dr. Trevor Mace suspicion that this patient is excessively daytime sleepy is verified in the Epworth sleepiness score of 18 points. There is also a possibility of cataplectic attacks but I could not find evidence of sleep intrusion dream intrusion, sleep paralysis. It seems that the last thing related dizzy spells may be related to a Valsalva maneuver. What I will order for this patient however is a split-night polysomnography followed by an M SLT my goal is to find out if the patient does have REM sleep or if she has sleep apnea needing to be treated before any further investigation may be necessary. I do think she has a high index of risk for obstructive sleep apnea based on her neck size a goiter and her reported snoring. I think it would be financially beneficial for the patient to have the attended sleep study were a CPAP treatment could be addressed should it be necessary and I would like her then to revisit with me in a splint about 60 days later and see if we still are dealing with excessive daytime hypersomnia. More than 50% of our visit time today was spent in discussion with the patient information about sleep disorders and also the treatment and testing options provided. To resume an earlier bedtime to make sure that she is not sleep deprived for other reasons she should keep 7 hours set aside is designated sleep time. I would like her to drink caffeinated beverages only in the morning but not in the afternoon and to establish an exercise regimen which would also help.   The patient was  advised of the nature of the diagnosed sleep disorder , the treatment options and risks for general a health and wellness arising from not treating the condition. Visit duration was 40 minutes.   Plan:  Treatment plan and additional workup :  SPLIT night Polysomnography , followed by MSLT,  if no OSA is found. Evaluate  for OSA, Narcolepsy, cataplexy.      Porfirio Mylar Aulani Shipton MD  03/25/2015

## 2015-03-25 NOTE — Patient Instructions (Signed)
Hypersomnia Hypersomnia usually brings recurrent episodes of excessive daytime sleepiness or prolonged nighttime sleep. It is different than feeling tired due to lack of or interrupted sleep at night. People with hypersomnia are compelled to nap repeatedly during the day. This is often at inappropriate times such as:  At work.  During a meal.  In conversation. These daytime naps usually provide no relief. This disorder typically affects adolescents and young adults. CAUSES  This condition may be caused by:  Another sleep disorder (such as narcolepsy or sleep apnea).  Dysfunction of the autonomic nervous system.  Drug or alcohol abuse.  A physical problem, such as:  A tumor.  Head trauma. This is damage caused by an accident.  Injury to the central nervous system.  Certain medications, or medicine withdrawal.  Medical conditions may contribute to the disorder, including:  Multiple sclerosis.  Depression.  Encephalitis.  Epilepsy.  Obesity.  Some people appear to have a genetic predisposition to this disorder. In others, there is no known cause. SYMPTOMS   Patients often have difficulty waking from a long sleep. They may feel dazed or confused.  Other symptoms may include:  Anxiety.  Increased irritation (inflammation).  Decreased energy.  Restlessness.  Slow thinking.  Slow speech.  Loss of appetite.  Hallucinations.  Memory difficulty.  Tremors, Tics.  Some patients lose the ability to function in family, social, occupational, or other settings. TREATMENT  Treatment is symptomatic in nature. Stimulants and other drugs may be used to treat this disorder. Changes in behavior may help. For example, avoid night work and social activities that delay bed time. Changes in diet may offer some relief. Patients should avoid alcohol and caffeine. PROGNOSIS  The likely outcome (prognosis) for persons with hypersomnia depends on the cause of the disorder.  The disorder itself is not life threatening. But it can have serious consequences. For example, automobile accidents can be caused by falling asleep while driving. The attacks usually continue indefinitely. Document Released: 08/12/2002 Document Revised: 11/14/2011 Document Reviewed: 07/16/2008 ExitCare Patient Information 2015 [employer]. This information is not intended to replace advice given to you by your health care provider. Make sure you discuss any questions you have with your health care provider.  

## 2015-03-26 NOTE — Telephone Encounter (Signed)
We can expand the nocturnal sleep study EEG - this would be all in one. Order to sleep lab.

## 2015-03-26 NOTE — Telephone Encounter (Signed)
Spoke w/ pt and she was told by Dr. Vickey Huger to hold off on extended EEG. They were going to try and avoid her picking up equipment from other doctor. Pt confused on plan and would like clarification. I told her I would speak with Dr. Vickey Huger and call her back. She verbalized understanding.

## 2015-03-26 NOTE — Telephone Encounter (Signed)
Called pt back and explained that we wanted to do the EEG during her sleep study here. Pt wants the sleep study to be approved before 7/26 because she is supposed to pick up her HST equipment on 7/26. Split night with EEG expanded ordered.

## 2015-03-26 NOTE — Addendum Note (Signed)
Addended by: Geronimo Running A on: 03/26/2015 05:14 PM   Modules accepted: Orders

## 2015-03-27 NOTE — Telephone Encounter (Signed)
Great then, we don't need an extended eeg if Dr. Vickey Huger will do an eeg during the sleep study. Thank you Dr. Vickey Huger!!

## 2015-03-31 ENCOUNTER — Ambulatory Visit (HOSPITAL_BASED_OUTPATIENT_CLINIC_OR_DEPARTMENT_OTHER): Payer: Managed Care, Other (non HMO)

## 2015-04-01 NOTE — Telephone Encounter (Signed)
We ordered expanded EEG in sleep study.

## 2015-05-18 ENCOUNTER — Ambulatory Visit (INDEPENDENT_AMBULATORY_CARE_PROVIDER_SITE_OTHER): Payer: Managed Care, Other (non HMO) | Admitting: Neurology

## 2015-05-18 DIAGNOSIS — K2100 Gastro-esophageal reflux disease with esophagitis, without bleeding: Secondary | ICD-10-CM

## 2015-05-18 DIAGNOSIS — G4710 Hypersomnia, unspecified: Secondary | ICD-10-CM

## 2015-05-18 DIAGNOSIS — G47411 Narcolepsy with cataplexy: Secondary | ICD-10-CM

## 2015-05-18 DIAGNOSIS — G4730 Sleep apnea, unspecified: Secondary | ICD-10-CM

## 2015-05-18 DIAGNOSIS — R0683 Snoring: Secondary | ICD-10-CM

## 2015-05-18 NOTE — Sleep Study (Signed)
Please see the scanned sleep study interpretation located in the Procedure tab within the Chart Review section. 

## 2015-05-21 ENCOUNTER — Telehealth: Payer: Self-pay | Admitting: Neurology

## 2015-05-21 NOTE — Telephone Encounter (Signed)
Spoke to regarding sleep study follow up. I advised pt that I would call her when I got sleep study results and we would discuss her follow up at this time. I advised pt that Dr. Vickey Huger may require up to 2 weeks to read sleep study. Pt verbalized understanding.

## 2015-05-21 NOTE — Telephone Encounter (Signed)
Patient called regarding follow up from recent sleep study. Patient states no one scheduled any kind of follow up for her. Is she supposed to come back for follow up? If so, how soon and for what type of visit?

## 2015-05-27 ENCOUNTER — Telehealth: Payer: Self-pay

## 2015-05-27 DIAGNOSIS — G4733 Obstructive sleep apnea (adult) (pediatric): Secondary | ICD-10-CM

## 2015-05-27 NOTE — Telephone Encounter (Signed)
Spoke to pt regarding cpap results. 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 I advised her that Dr. Vickey Huger recommended proceeding with a titration study. I also advised weight loss and positional therapy. Pt wishes to proceed with cpap titration study. I advised her that our sleep lab would be calling her soon to set the study date. Pt verbalized understanding.

## 2015-06-02 NOTE — Telephone Encounter (Signed)
Pt called about titration sleep study. She has not been scheduled and was wondering if she would be. Please call and advise

## 2015-06-09 ENCOUNTER — Ambulatory Visit
Admission: RE | Admit: 2015-06-09 | Discharge: 2015-06-09 | Disposition: A | Discharge status: Home / Self Care | Payer: Managed Care, Other (non HMO) | Source: Ambulatory Visit | Attending: Obstetrics and Gynecology | Admitting: Obstetrics and Gynecology

## 2015-06-09 ENCOUNTER — Other Ambulatory Visit: Payer: Self-pay | Admitting: Obstetrics and Gynecology

## 2015-06-09 DIAGNOSIS — Z1231 Encounter for screening mammogram for malignant neoplasm of breast: Secondary | ICD-10-CM

## 2015-06-30 ENCOUNTER — Telehealth: Payer: Self-pay

## 2015-06-30 DIAGNOSIS — G4733 Obstructive sleep apnea (adult) (pediatric): Secondary | ICD-10-CM

## 2015-06-30 NOTE — Telephone Encounter (Signed)
[employer] denied cpap titration study. They will approve an auto cpap titration from dme company. Dr. Vickey Hugerohmeier was made aware of this and ordered an auto cpap 5-15 cm H2O for 2 weeks.  I called and relayed this information to the patient. She is agreeable to a 2 week auto titration study. I advised her that after the two weeks are up, the DME company will fax me the results and Dr. Vickey Hugerohmeier will decide which pressure to start the pt's new cpap on. A follow up appt was made for 09/07/2014 at 11:30. Pt verbalized understanding.

## 2015-07-21 ENCOUNTER — Telehealth: Payer: Self-pay | Admitting: Neurology

## 2015-07-21 NOTE — Telephone Encounter (Signed)
Returned pt's phone call. Not sure what she means by this message.  I spoke to [employer], they spoke to pt about 10 minutes ago and got her scheduled to get her apap. Pt had questions regarding her insurance for [employer].

## 2015-07-21 NOTE — Telephone Encounter (Signed)
Spoke to pt. She was contacted by [employer]. She should be getting set up for her apap soon. I reiterated her appt on 09/07/14 at 11:30 and to bring cpap. Pt verbalized understanding.

## 2015-07-21 NOTE — Telephone Encounter (Signed)
Patient called in to find out about her auto pap.  [employer] denied a CPAP.  The patient has [facility].  Do you need to do anything for the patient once her insurance company has [facility]?

## 2015-09-01 ENCOUNTER — Encounter: Payer: Self-pay | Admitting: Neurology

## 2015-09-08 ENCOUNTER — Ambulatory Visit: Payer: Self-pay | Admitting: Neurology

## 2015-12-11 ENCOUNTER — Other Ambulatory Visit: Payer: Self-pay | Admitting: Gastroenterology

## 2015-12-11 ENCOUNTER — Ambulatory Visit
Admission: RE | Admit: 2015-12-11 | Discharge: 2015-12-11 | Disposition: A | Discharge status: Home / Self Care | Payer: Managed Care, Other (non HMO) | Source: Ambulatory Visit | Attending: Gastroenterology | Admitting: Gastroenterology

## 2015-12-11 DIAGNOSIS — R140 Abdominal distension (gaseous): Secondary | ICD-10-CM

## 2015-12-11 DIAGNOSIS — R1030 Lower abdominal pain, unspecified: Secondary | ICD-10-CM

## 2015-12-11 DIAGNOSIS — R1013 Epigastric pain: Secondary | ICD-10-CM

## 2015-12-14 ENCOUNTER — Emergency Department (HOSPITAL_COMMUNITY)
Admission: EM | Admit: 2015-12-14 | Discharge: 2015-12-14 | Disposition: A | Discharge status: Home / Self Care | Payer: Managed Care, Other (non HMO) | Attending: Emergency Medicine | Admitting: Emergency Medicine

## 2015-12-14 ENCOUNTER — Encounter (HOSPITAL_COMMUNITY): Payer: Self-pay | Admitting: Emergency Medicine

## 2015-12-14 DIAGNOSIS — Z8639 Personal history of other endocrine, nutritional and metabolic disease: Secondary | ICD-10-CM | POA: Diagnosis not present

## 2015-12-14 DIAGNOSIS — M47812 Spondylosis without myelopathy or radiculopathy, cervical region: Secondary | ICD-10-CM | POA: Insufficient documentation

## 2015-12-14 DIAGNOSIS — Z8719 Personal history of other diseases of the digestive system: Secondary | ICD-10-CM | POA: Diagnosis not present

## 2015-12-14 DIAGNOSIS — R6884 Jaw pain: Secondary | ICD-10-CM

## 2015-12-14 DIAGNOSIS — Z8679 Personal history of other diseases of the circulatory system: Secondary | ICD-10-CM | POA: Diagnosis not present

## 2015-12-14 DIAGNOSIS — Z79899 Other long term (current) drug therapy: Secondary | ICD-10-CM | POA: Diagnosis not present

## 2015-12-14 DIAGNOSIS — M47816 Spondylosis without myelopathy or radiculopathy, lumbar region: Secondary | ICD-10-CM | POA: Diagnosis not present

## 2015-12-14 LAB — BASIC METABOLIC PANEL
Anion gap: 9 (ref 5–15)
BUN: 15 mg/dL (ref 6–20)
CO2: 24 mmol/L (ref 22–32)
Calcium: 9.4 mg/dL (ref 8.9–10.3)
Chloride: 108 mmol/L (ref 101–111)
Creatinine, Ser: 0.8 mg/dL (ref 0.44–1.00)
GFR calc Af Amer: >60 mL/min (ref >60)
GFR calc non Af Amer: >60 mL/min (ref >60)
Glucose, Bld: 97 mg/dL (ref 65–99)
Potassium: 4 mmol/L (ref 3.5–5.1)
Sodium: 141 mmol/L (ref 135–145)

## 2015-12-14 LAB — CBC WITH DIFFERENTIAL/PLATELET
Basophils Absolute: 0 10*3/uL (ref 0.0–0.1)
Basophils Relative: 0 %
Eosinophils Absolute: 0.2 10*3/uL (ref 0.0–0.7)
Eosinophils Relative: 3 %
HCT: 38.4 % (ref 36.0–46.0)
Hemoglobin: 13.2 g/dL (ref 12.0–15.0)
Lymphocytes Relative: 31 %
Lymphs Abs: 2.3 10*3/uL (ref 0.7–4.0)
MCH: 28.2 pg (ref 26.0–34.0)
MCHC: 34.4 g/dL (ref 30.0–36.0)
MCV: 82.1 fL (ref 78.0–100.0)
Monocytes Absolute: 0.4 10*3/uL (ref 0.1–1.0)
Monocytes Relative: 6 %
Neutro Abs: 4.6 10*3/uL (ref 1.7–7.7)
Neutrophils Relative %: 60 %
Platelets: 224 10*3/uL (ref 150–400)
RBC: 4.68 MIL/uL (ref 3.87–5.11)
RDW: 13.6 % (ref 11.5–15.5)
WBC: 7.6 10*3/uL (ref 4.0–10.5)

## 2015-12-14 LAB — SEDIMENTATION RATE: Sed Rate: 5 mm/hr (ref 0–22)

## 2015-12-14 LAB — TROPONIN I: Troponin I: <0.03 ng/mL (ref <0.031)

## 2015-12-14 NOTE — ED Notes (Signed)
Pt has "nodules" in neck that her PCP has been watching over a year. Has had a biopsy done with multiple ultrasounds over the last year. States over the last few days the pain has become almost "numbing" to the right side of her face. No obvious swelling/difficulty breathing/talking/swallowing observed in triage. Says she feels like it's a bad toothache.

## 2015-12-14 NOTE — Discharge Instructions (Signed)
Pain Without a Known Cause °WHAT IS PAIN WITHOUT A KNOWN CAUSE? °Pain can occur in any part of the body and can range from mild to severe. Sometimes no cause can be found for why you are having pain. Some types of pain that can occur without a known cause include:  °· Headache. °· Back pain. °· Abdominal pain. °· Neck pain. °HOW IS PAIN WITHOUT A KNOWN CAUSE DIAGNOSED?  °Your health care provider will try to find the cause of your pain. This may include: °· Physical exam. °· Medical history. °· Blood tests. °· Urine tests. °· X-rays. °If no cause is found, your health care provider may diagnose you with pain without a known cause.  °IS THERE TREATMENT FOR PAIN WITHOUT A CAUSE?  °Treatment depends on the kind of pain you have. Your health care provider may prescribe medicines to help relieve your pain.  °WHAT CAN I DO AT HOME FOR MY PAIN?  °· Take medicines only as directed by your health care provider. °· Stop any activities that cause pain. During periods of severe pain, bed rest may help. °· Try to reduce your stress with activities such as yoga or meditation. Talk to your health care provider for other stress-reducing activity recommendations. °· Exercise regularly, if approved by your health care provider. °· Eat a healthy diet that includes fruits and vegetables. This may improve pain. Talk to your health care provider if you have any questions about your diet. °WHAT IF MY PAIN DOES NOT GET BETTER?  °If you have a painful condition and no reason can be found for the pain or the pain gets worse, it is important to follow up with your health care provider. It may be necessary to repeat tests and look further for a possible cause.  °  °This information is not intended to replace advice given to you by your health care provider. Make sure you discuss any questions you have with your health care provider. °  °Document Released: 05/17/2001 Document Revised: 09/12/2014 Document Reviewed: 01/07/2014 °Elsevier  Interactive Patient Education ©2016 Elsevier Inc. ° °

## 2015-12-14 NOTE — ED Provider Notes (Addendum)
CSN: [id number]     Arrival date & time 12/14/15  1433 History   First MD Initiated Contact with Patient 12/14/15 1723     Chief Complaint  Patient presents with  . Jaw Pain     (Consider location/radiation/quality/duration/timing/severity/associated sxs/prior Treatment) HPI   60 year old female with bilateral jaw pain. Intermittent for the past several months. Worse within the past several days. Pain ranges from numbing and aching to some times more intense pain. No appreciable exacerbating relieving factors. Denies any trauma. No discrete toothache. No difficulty breathing or swallowing. She is concerned that it may be related to her thyroid.  Past Medical History  Diagnosis Date  . Acid reflux   . Hypercholesteremia   . Thyroid disease   . Thyroid nodule   . H/O hiatal hernia   . Headache(784.0)     migraines  . Arthritis     back and neck   Past Surgical History  Procedure Laterality Date  . Abdominal hysterectomy    . Cholecystectomy    . Ectopic pregnancy surgery    . Eye surgery      lasik  . Eus  12/21/2011    Procedure: ESOPHAGEAL ENDOSCOPIC ULTRASOUND (EUS) RADIAL;  Surgeon: Arta Silence, MD;  Location: [facility];  Service: Endoscopy;  Laterality: N/A;   Family History  Problem Relation Age of Onset  . Colon cancer Mother   . Pancreatic cancer Brother    Social History  Substance Use Topics  . Smoking status: Never Smoker   . Smokeless tobacco: Never Used  . Alcohol Use: Yes     Comment: occasionally   OB History    No data available     Review of Systems  All systems reviewed and negative, other than as noted in HPI.   Allergies  Morphine and related and Sulfa antibiotics  Home Medications   Prior to Admission medications   Medication Sig Start Date End Date Taking? Authorizing Provider  b complex vitamins tablet Take 1 tablet by mouth daily.   Yes Historical Provider, MD  ibuprofen (ADVIL,MOTRIN) 200 MG tablet Take 400 mg by mouth every  6 (six) hours as needed for moderate pain.   Yes Historical Provider, MD  Multiple Vitamin (MULTIVITAMIN) LIQD Take 15 mLs by mouth daily.   Yes Historical Provider, MD  neomycin-polymyxin-hydrocortisone (CORTISPORIN) otic solution Place 4 drops into the left ear 4 (four) times daily as needed (for ear pain).   Yes Historical Provider, MD   BP 129/80 mmHg  Pulse 72  Temp(Src) 98.9 F (37.2 C) (Oral)  Resp 16  SpO2 98% Physical Exam  Constitutional: She is oriented to person, place, and time. She appears well-developed and well-nourished. No distress.  HENT:  Head: Normocephalic and atraumatic.  Face/neck normal to inspection. No facial swelling. No concerning intraoral lesions noted. Dentition looks fine. No temporal tenderness.  No facial/neck tenderness. No adenopathy. Thyroid enlarged. Nontender. Neck supple.   Eyes: Conjunctivae are normal. Pupils are equal, round, and reactive to light. Right eye exhibits no discharge. Left eye exhibits no discharge.  Neck: Neck supple.  Cardiovascular: Normal rate, regular rhythm and normal heart sounds.  Exam reveals no gallop and no friction rub.   No murmur heard. Pulmonary/Chest: Effort normal and breath sounds normal. No respiratory distress.  Abdominal: Soft. She exhibits no distension. There is no tenderness.  Musculoskeletal: She exhibits no edema or tenderness.  Neurological: She is alert and oriented to person, place, and time. No cranial nerve deficit. She exhibits normal  muscle tone. Coordination normal.  Skin: Skin is warm and dry.  Psychiatric: She has a normal mood and affect. Her behavior is normal. Thought content normal.  Nursing note and vitals reviewed.   ED Course  Procedures (including critical care time) Labs Review Labs Reviewed  SEDIMENTATION RATE  CBC WITH DIFFERENTIAL/PLATELET  BASIC METABOLIC PANEL  TROPONIN I    Imaging Review No results found. I have personally reviewed and evaluated these images and lab  results as part of my medical decision-making.   EKG Interpretation None      MDM   Final diagnoses:  Jaw pain    60 year old female with jaw pain. Intermittent for months, worse in last couple days.   -Consider ACS, but very atypical symptoms. No other associated symptoms. No change with exertion. -Consider temporal arteritis. Symptoms somewhat atypical for this as well. Jaw hurts, but no claudication. Can check ESR. -Consider TMJ. -Consider trigeminal neuralgia, but doubt. -Not clearly odontologic. Dentition unremarkable.  -Hx of thyroiditis/nodules. She has some concerns may be related to thyroid. Clinically I doubt this. Has upcoming about with surgery with regards to this.   Will check some studies. I doubt I am going to be able to give her an exact etiology of her symptoms. Low suspicion for emergent process though. Declining pain medication.      Virgel Manifold, MD 12/23/15 1358  Virgel Manifold, MD 12/28/15 [phone]

## 2015-12-17 ENCOUNTER — Other Ambulatory Visit: Payer: Managed Care, Other (non HMO)

## 2015-12-30 ENCOUNTER — Ambulatory Visit
Admission: RE | Admit: 2015-12-30 | Discharge: 2015-12-30 | Disposition: A | Discharge status: Home / Self Care | Payer: Managed Care, Other (non HMO) | Source: Ambulatory Visit | Attending: Gastroenterology | Admitting: Gastroenterology

## 2015-12-30 ENCOUNTER — Other Ambulatory Visit: Payer: Managed Care, Other (non HMO)

## 2015-12-30 DIAGNOSIS — R1013 Epigastric pain: Secondary | ICD-10-CM

## 2015-12-30 DIAGNOSIS — R1030 Lower abdominal pain, unspecified: Secondary | ICD-10-CM

## 2015-12-30 MED ORDER — IOPAMIDOL (ISOVUE-300) INJECTION 61%
100.0000 mL | Freq: Once | INTRAVENOUS | Status: AC | PRN
  Administered 2015-12-30: 100 mL via INTRAVENOUS

## 2016-02-17 ENCOUNTER — Other Ambulatory Visit: Payer: Self-pay | Admitting: Obstetrics and Gynecology

## 2016-02-17 DIAGNOSIS — N644 Mastodynia: Secondary | ICD-10-CM

## 2016-02-24 ENCOUNTER — Ambulatory Visit
Admission: RE | Admit: 2016-02-24 | Discharge: 2016-02-24 | Disposition: A | Discharge status: Home / Self Care | Payer: Managed Care, Other (non HMO) | Source: Ambulatory Visit | Attending: Obstetrics and Gynecology | Admitting: Obstetrics and Gynecology

## 2016-02-24 DIAGNOSIS — N644 Mastodynia: Secondary | ICD-10-CM

## 2016-05-23 ENCOUNTER — Other Ambulatory Visit: Payer: Self-pay | Admitting: Obstetrics and Gynecology

## 2016-05-23 DIAGNOSIS — Z1231 Encounter for screening mammogram for malignant neoplasm of breast: Secondary | ICD-10-CM

## 2016-06-10 ENCOUNTER — Ambulatory Visit
Admission: RE | Admit: 2016-06-10 | Discharge: 2016-06-10 | Disposition: A | Discharge status: Home / Self Care | Payer: Managed Care, Other (non HMO) | Source: Ambulatory Visit | Attending: Obstetrics and Gynecology | Admitting: Obstetrics and Gynecology

## 2016-06-10 DIAGNOSIS — Z1231 Encounter for screening mammogram for malignant neoplasm of breast: Secondary | ICD-10-CM

## 2016-07-02 IMAGING — CR DG ABDOMEN 1V
1 series · 1 of 1 positions shown · non-contrast
Comparison: CT 08/22/2012.

CLINICAL DATA: Bloating.  Abdominal pain.

EXAM:
ABDOMEN - 1 VIEW

[t abdomen supine]
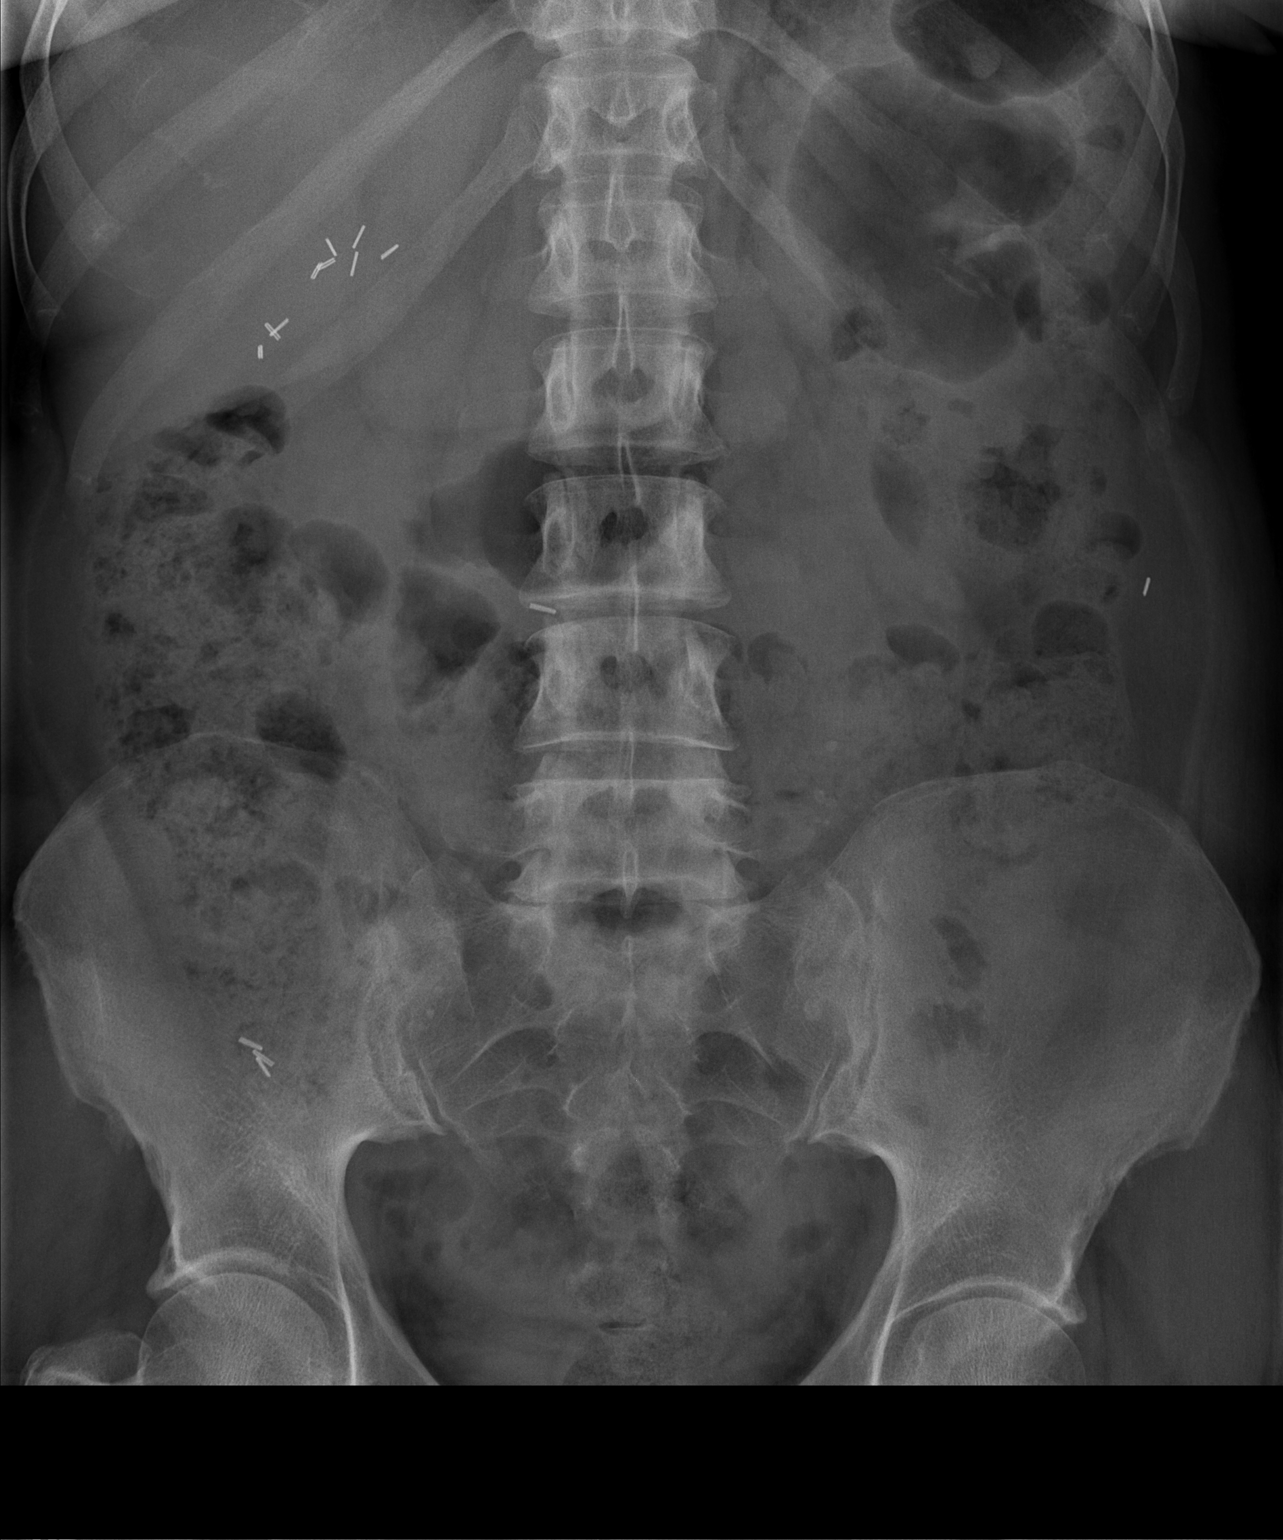

[1 of 1 positions shown; findings below may reference images not displayed]

FINDINGS: Surgical clips noted throughout the abdomen. Large amount of stool
noted throughout the colon suggesting constipation. No bowel
distention. Calcific densities noted throughout the abdomen pelvis,
most likely phleboliths. No acute bony abnormality.
IMPRESSION: Large amount stool noted throughout the colon. Constipation cannot
be excluded. No bowel distention.

## 2016-07-21 IMAGING — CT CT ABD-PELV W/ CM
2 of 5 series · 16 of 46 positions shown, 18 images · IV contrast (APPLIED)
Comparison: CT abdomen [DATE]

CLINICAL DATA: Epigastric pain and lower abdominal pain since
September 2015. History of cholecystectomy and hysterectomy.

EXAM:
CT ABDOMEN AND PELVIS WITH CONTRAST
TECHNIQUE: Multidetector CT imaging of the abdomen and pelvis was performed
using the standard protocol following bolus administration of
intravenous contrast.
CONTRAST:  100mL KXO8UC-0GG IOPAMIDOL (KXO8UC-0GG) INJECTION 61%

[Series 2: abd/pelvis w/cm · axial · 0.69mm/px · z∈[-409,-4]mm · 13 of 91 slices shown, 15 images]
[im 5/91  soft-tissue]
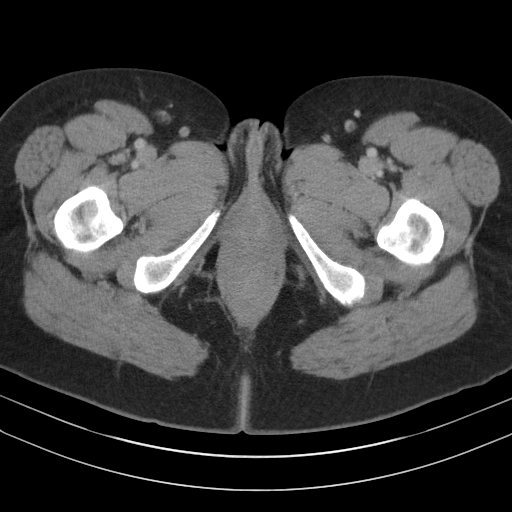
[im 5/91  bone]
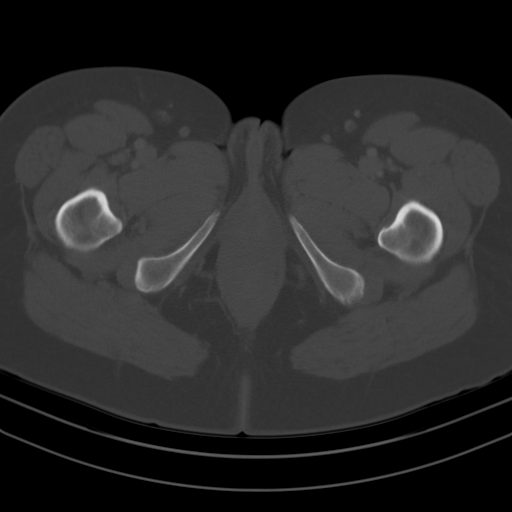
[im 15/91  soft-tissue]
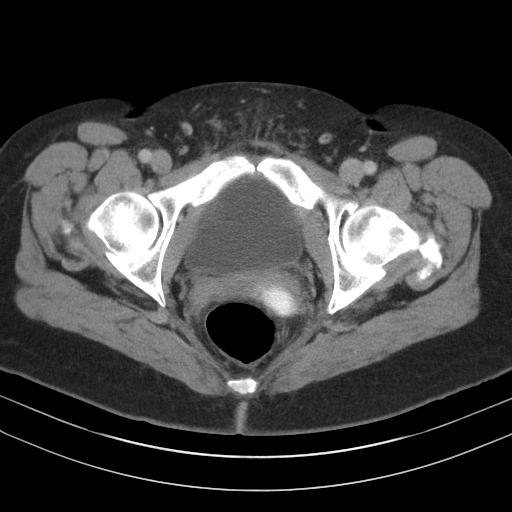
[im 19/91  soft-tissue]
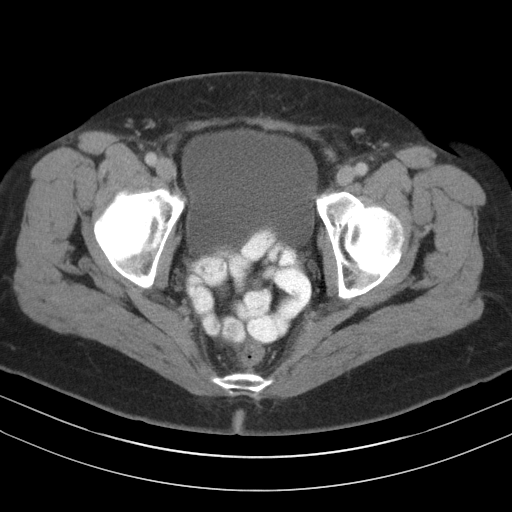
[im 24/91  soft-tissue]
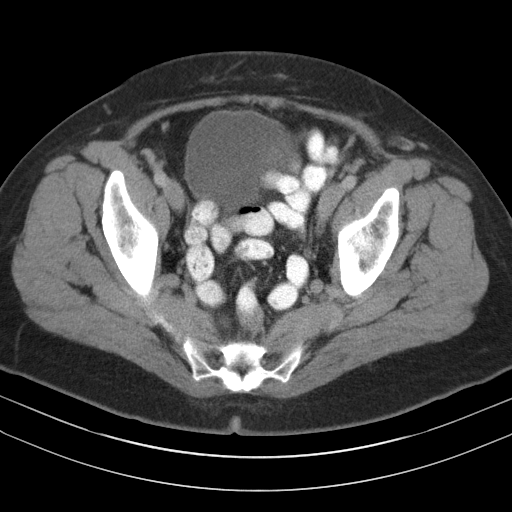
[im 34/91  soft-tissue]
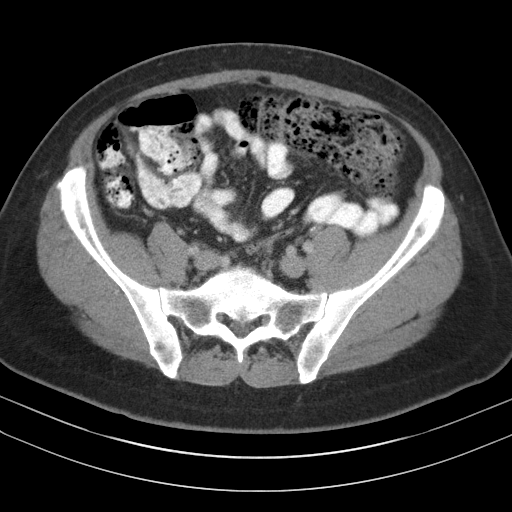
[im 38/91  soft-tissue]
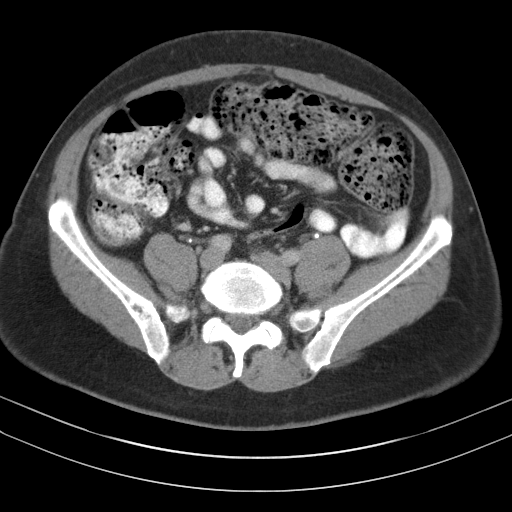
[im 48/91  soft-tissue]
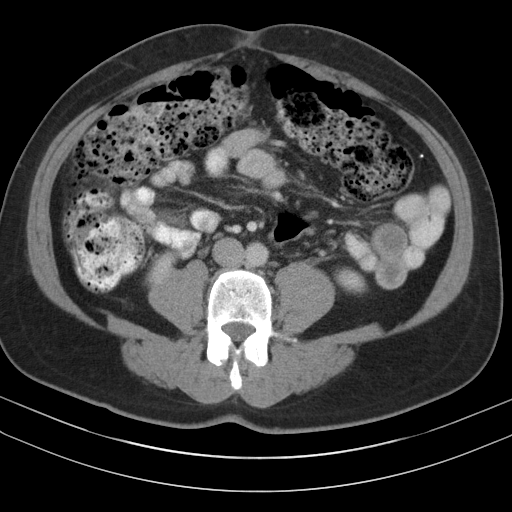
[im 53/91  soft-tissue]
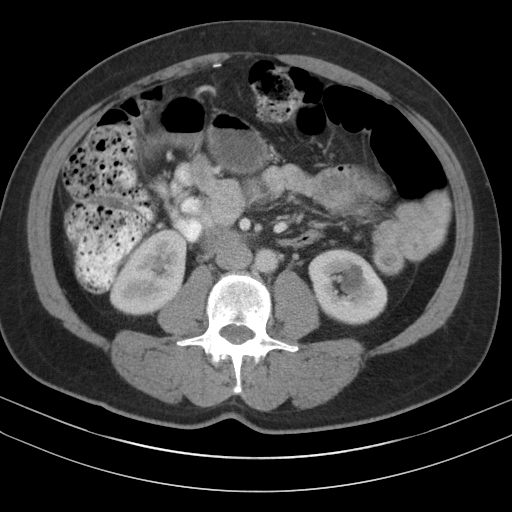
[im 57/91  soft-tissue]
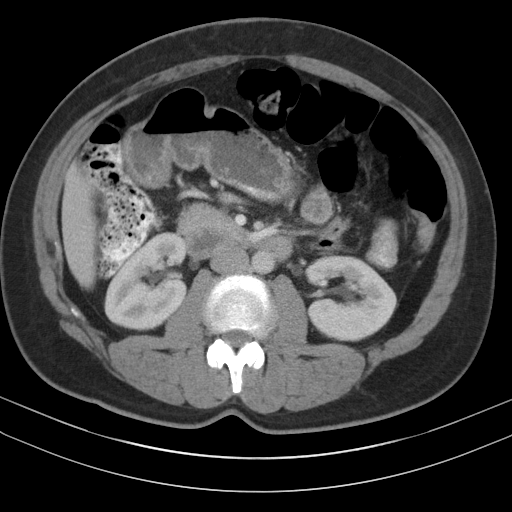
[im 57/91  bone]
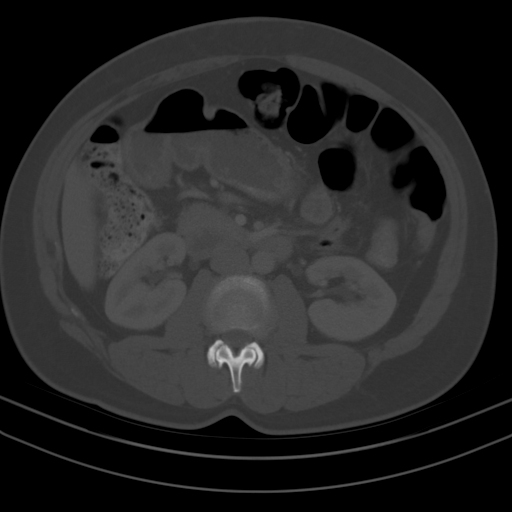
[im 67/91  soft-tissue]
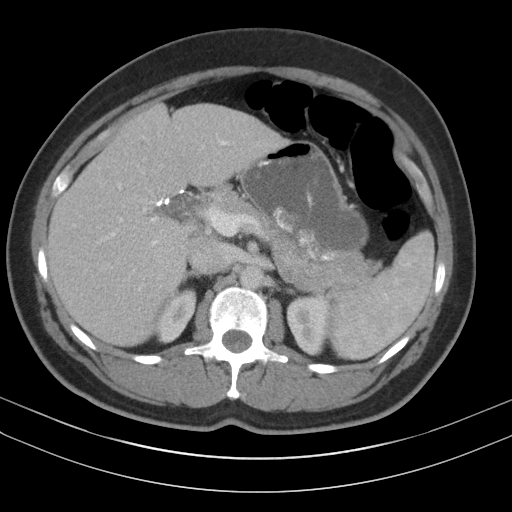
[im 72/91  soft-tissue]
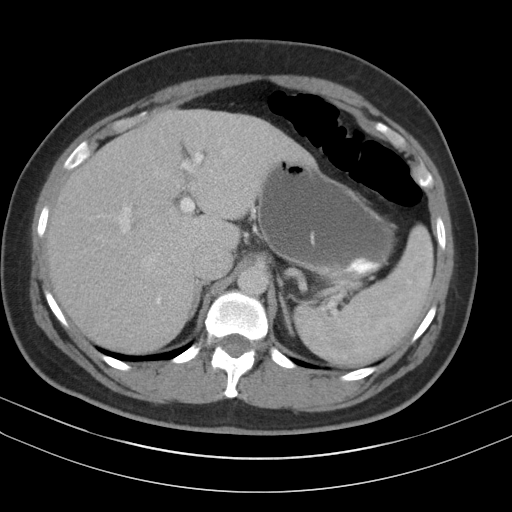
[im 76/91  soft-tissue]
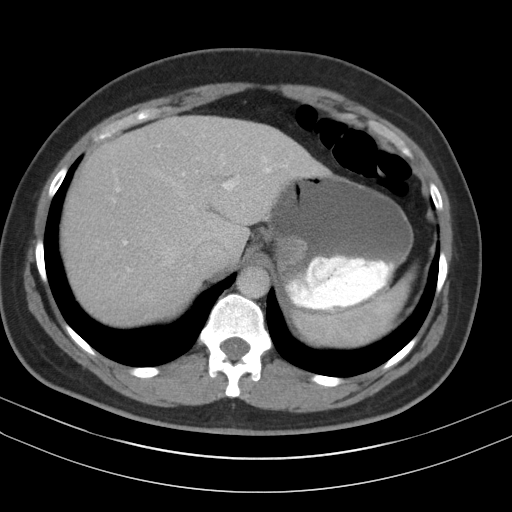
[im 86/91  soft-tissue]
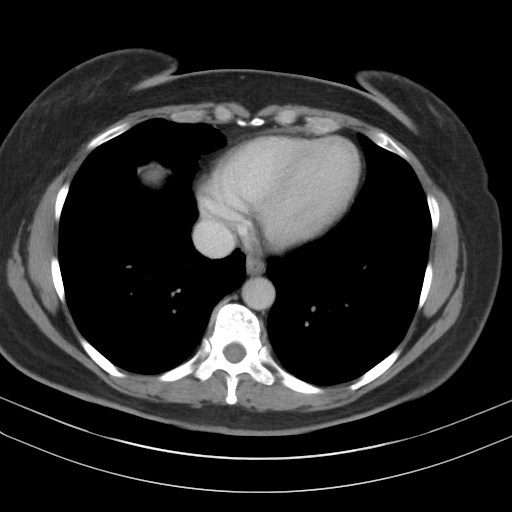

[Series 3: cor · coronal · 0.70mm/px · 3 of 95 slices shown]
[im 32/95  soft-tissue]
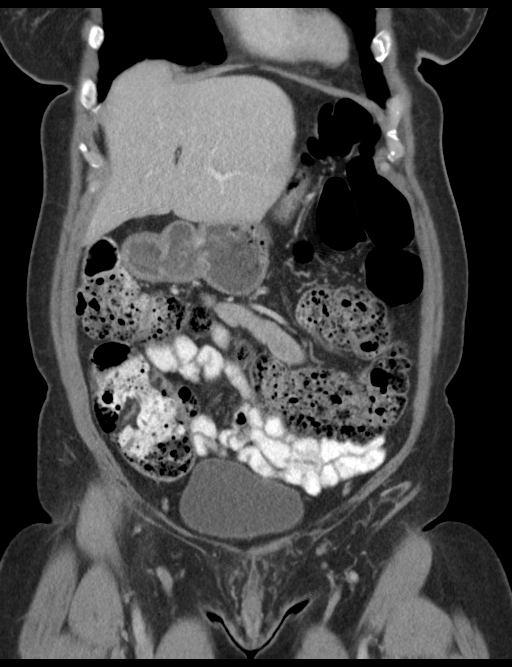
[im 42/95  soft-tissue]
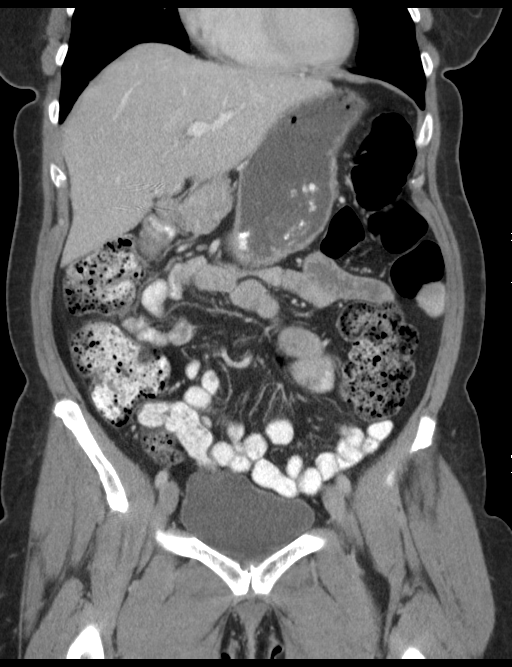
[im 53/95  soft-tissue]
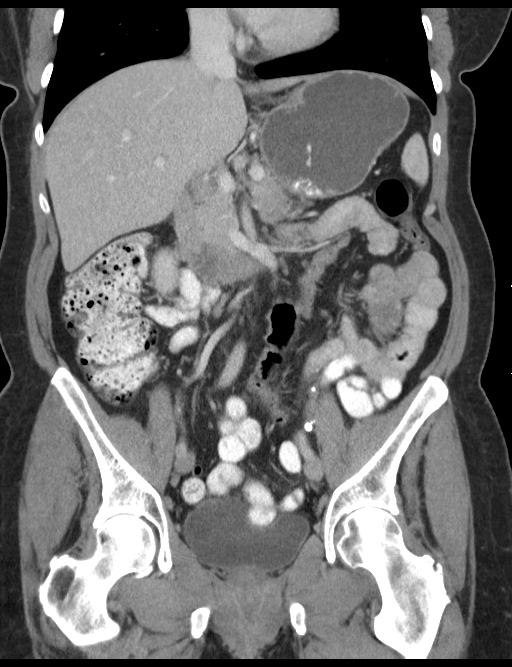

[16 of 46 positions shown; findings below may reference images not displayed]

FINDINGS: Lower chest: Lung bases are clear.

Hepatobiliary: No focal hepatic lesion. Postcholecystectomy. No
biliary dilatation.

Pancreas: Pancreas is normal. No ductal dilatation. No pancreatic
inflammation.

Spleen: Normal spleen

Adrenals/urinary tract: Adrenal glands normal. Bilateral simple
fluid attenuation renal lesions. Ureters and bladder normal.

Stomach/Bowel: Stomach, small bowel, appendix, and cecum are normal.
Moderate volume stool in the ascending and transverse colon. Distal
Transverse colon is gas-filled and tortuous. The proximal descending
colon is tortuous additionally. No obstructing lesion identified
however the descending colon is decompressed. Gas in the rectum.

Vascular/Lymphatic: Abdominal aorta is normal caliber. There is no
retroperitoneal or periportal lymphadenopathy. No pelvic
lymphadenopathy.

Reproductive: Post hysterectomy

Other: No free fluid.

Musculoskeletal: No aggressive osseous lesion.
IMPRESSION: 1. No acute findings in the abdomen pelvis.
2. Moderate volume stool in the RIGHT colon could indicate
constipation. Tortuous transverse colon and descending colon. No
obstructing lesion identified. Consider screening colonoscopy if not
current.
3. Post cholecystectomy and hysterectomy

## 2016-10-03 ENCOUNTER — Encounter (HOSPITAL_COMMUNITY): Payer: Self-pay | Admitting: *Deleted

## 2016-10-03 ENCOUNTER — Emergency Department (HOSPITAL_COMMUNITY)
Admission: EM | Admit: 2016-10-03 | Discharge: 2016-10-03 | Disposition: A | Discharge status: Home / Self Care | Payer: Managed Care, Other (non HMO) | Attending: Emergency Medicine | Admitting: Emergency Medicine

## 2016-10-03 DIAGNOSIS — R69 Illness, unspecified: Secondary | ICD-10-CM

## 2016-10-03 DIAGNOSIS — J111 Influenza due to unidentified influenza virus with other respiratory manifestations: Secondary | ICD-10-CM | POA: Insufficient documentation

## 2016-10-03 DIAGNOSIS — J029 Acute pharyngitis, unspecified: Secondary | ICD-10-CM | POA: Diagnosis present

## 2016-10-03 LAB — RAPID STREP SCREEN (MED CTR MEBANE ONLY): Streptococcus, Group A Screen (Direct): NEGATIVE

## 2016-10-03 MED ORDER — OSELTAMIVIR PHOSPHATE 75 MG PO CAPS
75.0000 mg | ORAL_CAPSULE | Freq: Once | ORAL | Status: AC
  Administered 2016-10-03: 75 mg via ORAL
  Filled 2016-10-03: qty 1

## 2016-10-03 MED ORDER — ONDANSETRON 4 MG PO TBDP
4.0000 mg | ORAL_TABLET | Freq: Once | ORAL | Status: AC
  Administered 2016-10-03: 4 mg via ORAL
  Filled 2016-10-03: qty 1

## 2016-10-03 MED ORDER — HYDROCODONE-ACETAMINOPHEN 5-325 MG PO TABS: ORAL_TABLET | ORAL | 0 refills | Status: DC

## 2016-10-03 MED ORDER — OSELTAMIVIR PHOSPHATE 75 MG PO CAPS: 75.0000 mg | ORAL_CAPSULE | Freq: Two times a day (BID) | ORAL | 0 refills | Status: DC

## 2016-10-03 MED ORDER — HYDROCODONE-ACETAMINOPHEN 5-325 MG PO TABS
1.0000 | ORAL_TABLET | Freq: Once | ORAL | Status: AC
  Administered 2016-10-03: 1 via ORAL
  Filled 2016-10-03: qty 1

## 2016-10-03 NOTE — ED Triage Notes (Signed)
Pt states that she began having a sore throat this am; pt states that she began prepping for a colonoscopy this evening and began to have chills; pt denies N/V; pt states that her temp was 101 at home but has not treated her temp

## 2016-10-03 NOTE — ED Provider Notes (Signed)
[facility]-EMERGENCY DEPT Provider Note   CSN: [id number] Arrival date & time: 10/03/16  1909     History   Chief Complaint Chief Complaint  Patient presents with  . Sore Throat     HPI   Blood pressure 133/82, pulse 94, temperature 99.2 F (37.3 C), temperature source Oral, resp. rate 18, SpO2 98 %.  [name] [name] is a 61 y.o. female no significant past medical history, was in her normal state of health today she has colonoscopy scheduled for tomorrow, she was drinking the bowel prep and she developed chills, myalgia, sore throat, rhinorrhea and cough. She denies fever, chills, sick contacts, headache, cervicalgia, rash, chest pain, shortness of breath, nausea, vomiting, change in bowel or bladder habits.  Past Medical History:  Diagnosis Date  . Acid reflux   . Arthritis    back and neck  . H/O hiatal hernia   . Headache(784.0)    migraines  . Hypercholesteremia   . Thyroid disease   . Thyroid nodule     Patient Active Problem List   Diagnosis Date Noted  . 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 Snoring 03/25/2015  . 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 Acute pancreatitis 10/26/2011  . Nausea and vomiting in adult 10/26/2011  . Dehydration 10/26/2011  . Gastroenteritis 10/26/2011  . Hypokalemia 10/26/2011  . Elevated liver function tests 10/26/2011  . Acid reflux   . Thyroid disease     Past Surgical History:  Procedure Laterality Date  . ABDOMINAL HYSTERECTOMY    . CHOLECYSTECTOMY    . ECTOPIC PREGNANCY SURGERY    . EUS  12/21/2011   Procedure: ESOPHAGEAL ENDOSCOPIC ULTRASOUND (EUS) RADIAL;  Surgeon: Willis ModenaWilliam Outlaw, MD;  Location: [facility] ENDOSCOPY;  Service: Endoscopy;  Laterality: N/A;  . EYE SURGERY     lasik    OB History    No data available       Home Medications    Prior to Admission medications   Medication Sig Start Date End Date Taking? Authorizing Provider  b complex vitamins tablet Take 1  tablet by mouth daily.    Historical Provider, MD  HYDROcodone-acetaminophen (NORCO/VICODIN) 5-325 MG tablet Take 1-2 tablets by mouth every 6 hours as needed for pain and/or cough. 10/03/16   Nyeli Holtmeyer, PA-C  ibuprofen (ADVIL,MOTRIN) 200 MG tablet Take 400 mg by mouth every 6 (six) hours as needed for moderate pain.    Historical Provider, MD  Multiple Vitamin (MULTIVITAMIN) LIQD Take 15 mLs by mouth daily.    Historical Provider, MD  neomycin-polymyxin-hydrocortisone (CORTISPORIN) otic solution Place 4 drops into the left ear 4 (four) times daily as needed (for ear pain).    Historical Provider, MD  oseltamivir (TAMIFLU) 75 MG capsule Take 1 capsule (75 mg total) by mouth every 12 (twelve) hours. 10/03/16   Joni ReiningNicole Javaya [place], PA-C    Family History Family History  Problem Relation Age of Onset  . Colon cancer Mother   . Pancreatic cancer Brother     Social History Social History  Substance Use Topics  . Smoking status: Never Smoker  . Smokeless tobacco: Never Used  . Alcohol use Yes     Comment: occasionally     Allergies   Morphine and related and Sulfa antibiotics   Review of Systems Review of Systems   10 systems reviewed and found to be negative, except as noted in the HPI.    Physical Exam Updated Vital Signs BP 133/82   Pulse 94  Temp 99.2 F (37.3 C) (Oral)   Resp 18   SpO2 98%   Physical Exam  Constitutional: She appears well-developed and well-nourished.  HENT:  Head: Normocephalic.  Right Ear: External ear normal.  Left Ear: External ear normal.  Mouth/Throat: Oropharynx is clear and moist. No oropharyngeal exudate.  No drooling or stridor. Posterior pharynx mildly erythematous no significant tonsillar hypertrophy. No exudate. Soft palate rises symmetrically. No TTP or induration under tongue.   No tenderness to palpation of frontal or bilateral maxillary sinuses.  Mild mucosal edema in the nares with scant rhinorrhea.  Bilateral tympanic  membranes with normal architecture and good light reflex.    Eyes: Conjunctivae and EOM are normal. Pupils are equal, round, and reactive to light.  Neck: Normal range of motion. Neck supple.  Cardiovascular: Normal rate and regular rhythm.   Pulmonary/Chest: Effort normal and breath sounds normal. No stridor. No respiratory distress. She has no wheezes. She has no rales. She exhibits no tenderness.  Abdominal: Soft. There is no tenderness. There is no rebound and no guarding.  Nursing note and vitals reviewed.    ED Treatments / Results  Labs (all labs ordered are listed, but only abnormal results are displayed) Labs Reviewed  RAPID STREP SCREEN (NOT AT [facility])  CULTURE, GROUP A STREP [facility]    EKG  EKG Interpretation None       Radiology No results found.  Procedures Procedures (including critical care time)  Medications Ordered in ED Medications  oseltamivir (TAMIFLU) capsule 75 mg (not administered)  ondansetron (ZOFRAN-ODT) disintegrating tablet 4 mg (not administered)  HYDROcodone-acetaminophen (NORCO/VICODIN) 5-325 MG per tablet 1 tablet (not administered)     Initial Impression / Assessment and Plan / ED Course  I have reviewed the triage vital signs and the nursing notes.  Pertinent labs & imaging results that were available during my care of the patient were reviewed by me and considered in my medical decision making (see chart for details).     Vitals:   10/03/16 1927  BP: 133/82  Pulse: 94  Resp: 18  Temp: 99.2 F (37.3 C)  TempSrc: Oral  SpO2: 98%    Medications  oseltamivir (TAMIFLU) capsule 75 mg (not administered)  ondansetron (ZOFRAN-ODT) disintegrating tablet 4 mg (not administered)  HYDROcodone-acetaminophen (NORCO/VICODIN) 5-325 MG per tablet 1 tablet (not administered)    [name] [name] is 61 y.o. female presenting with Sore throat, chills, cough, myalgia acute onset today. Likely influenza. Lung sounds clear to auscultation, she  saturating well on room air, no tachypnea or tachycardia. Will start patient on Tamiflu as per CDC recommendations. Advised patient to stay out of work and to return immediately for any new or worsening symptoms. Patient states she gets short of breath when she takes morphine but she's had Vicodin in the past with no adverse reactions.  Evaluation does not show pathology that would require ongoing emergent intervention or inpatient treatment. Pt is hemodynamically stable and mentating appropriately. Discussed findings and plan with patient/guardian, who agrees with care plan. All questions answered. Return precautions discussed and outpatient follow up given.    Final Clinical Impressions(s) / ED Diagnoses   Final diagnoses:  Influenza-like illness    New Prescriptions New Prescriptions   HYDROCODONE-ACETAMINOPHEN (NORCO/VICODIN) 5-325 MG TABLET    Take 1-2 tablets by mouth every 6 hours as needed for pain and/or cough.   OSELTAMIVIR (TAMIFLU) 75 MG CAPSULE    Take 1 capsule (75 mg total) by mouth every 12 (twelve) hours.  Wynetta Emery, PA-C 10/03/16 2221    Donnetta Hutching, MD 10/04/16 [phone]

## 2016-10-03 NOTE — ED Notes (Signed)
Patient was alert, oriented and stable upon discharge. RN went over AVS and patient had no further questions.  Patient was instructed not to drive or operate heavy machinery on narcotic pain medication.   

## 2016-10-03 NOTE — ED Notes (Signed)
No response when called from lobby to triage. 

## 2016-10-03 NOTE — Discharge Instructions (Signed)

## 2016-10-06 LAB — CULTURE, GROUP A STREP (THRC)

## 2016-12-07 ENCOUNTER — Other Ambulatory Visit: Payer: Self-pay | Admitting: Surgery

## 2016-12-07 DIAGNOSIS — E041 Nontoxic single thyroid nodule: Secondary | ICD-10-CM

## 2016-12-21 ENCOUNTER — Ambulatory Visit (INDEPENDENT_AMBULATORY_CARE_PROVIDER_SITE_OTHER): Payer: Managed Care, Other (non HMO) | Admitting: Orthopaedic Surgery

## 2016-12-21 ENCOUNTER — Encounter (INDEPENDENT_AMBULATORY_CARE_PROVIDER_SITE_OTHER): Payer: Self-pay | Admitting: Orthopaedic Surgery

## 2016-12-21 ENCOUNTER — Ambulatory Visit (INDEPENDENT_AMBULATORY_CARE_PROVIDER_SITE_OTHER): Payer: Managed Care, Other (non HMO)

## 2016-12-21 VITALS — BP 123/84 | HR 67 | Ht 66.5 in | Wt 194.0 lb

## 2016-12-21 DIAGNOSIS — M25522 Pain in left elbow: Secondary | ICD-10-CM

## 2016-12-21 DIAGNOSIS — M7712 Lateral epicondylitis, left elbow: Secondary | ICD-10-CM

## 2016-12-21 MED ORDER — METHYLPREDNISOLONE ACETATE 40 MG/ML IJ SUSP
20.0000 mg | INTRAMUSCULAR | Status: AC | PRN
  Administered 2016-12-21: 20 mg

## 2016-12-21 MED ORDER — BUPIVACAINE HCL 0.25 % IJ SOLN
0.3300 mL | INTRAMUSCULAR | Status: AC | PRN
  Administered 2016-12-21: .33 mL

## 2016-12-21 NOTE — Progress Notes (Signed)
Office Visit Note   Patient: [name] [name]           Date of Birth: [birth year]           MRN: [id number] Visit Date: 12/21/2016              Requested by: Laurena Slimmer, MD [address] [place], [place] [address] PCP: Laurena Slimmer, MD   Assessment & Plan: Visit Diagnoses:  1. Pain in left elbow   2. Left tennis elbow     Plan: At the sitting for a few minutes patient did report good relief with Marcaine in place. Was given stretching exercises to do at home. Follow-up in 4 weeks for recheck. If she has complete relief she may cancel that appointment. If symptoms have returned preinjection then I will plan to schedule MRI left elbow. All questions answered.  Follow-Up Instructions: Return in about 4 weeks (around 01/18/2017).   Orders:  Orders Placed This Encounter  Procedures  . XR Elbow 2 Views Left   No orders of the defined types were placed in this encounter.     Procedures: Hand/UE Inj Date/Time: 12/21/2016 10:09 AM Performed by: Naida Sleight Authorized by: Naida Sleight   Consent Given by:  Patient Indications:  Pain Condition: lateral epicondylitis   Site:  L elbow Prep: patient was prepped and draped in usual sterile fashion   Needle Size:  25 G Approach:  Lateral Ultrasound Guidance: No   Medications:  20 mg methylPREDNISolone acetate 40 MG/ML; 0.33 mL bupivacaine 0.25 %     Clinical Data: No additional findings.   Subjective: Chief Complaint  Patient presents with  . Left Elbow - Pain    HPI Patient comes in today with complaints of left lateral elbow pain for about 2 years. Pain localized to the lateral epicondyle with some extension into the proximal forearm. Denies neck pain or cervical radiculopathy. Elbow pain aggravated with lifting and gripping and twisting objects. Denies injury. States that this has progressively gotten worse over the last 1-2 months.  Patient is right-hand dominant. Denies numbness tingling  weakness. Review of Systems  All other systems reviewed and are negative.    Objective: Vital Signs: BP 123/84   Pulse 67   Ht 5' 6.5" (1.689 m)   Wt 194 lb (88 kg)   BMI 30.84 kg/m   Physical Exam  Constitutional: She is oriented to person, place, and time. She appears well-developed and well-nourished. No distress.  HENT:  Head: Normocephalic and atraumatic.  Eyes: EOM are normal. Pupils are equal, round, and reactive to light.  Neck: Normal range of motion.  Pulmonary/Chest: No respiratory distress.  Abdominal: She exhibits no distension.  Musculoskeletal:  Bilateral shoulders unremarkable. Left elbow good range of motion. No swelling or bruising noted. She states was slightly tender over the lateral epicondyle. No tendon defect. Lateral elbow pain aggravated with resisted wrist extension, forearm supination and grip testing. Negative Tinel's over the cubital tunnel. Nontender over the radial tunnel. Neurovascularly intact. Wrist unremarkable. Right elbow exam unremarkable.  Neurological: She is alert and oriented to person, place, and time.  Skin: Skin is warm and dry.  Psychiatric: She has a normal mood and affect.    Ortho Exam  Specialty Comments:  No specialty comments available.  Imaging: No results found.   PMFS History: Patient Active Problem List   Diagnosis Date Noted  . 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 Snoring 03/25/2015  . Hypersomnia with sleep apnea  03/25/2015  . 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 Acute pancreatitis 10/26/2011  . Nausea and vomiting in adult 10/26/2011  . Dehydration 10/26/2011  . Gastroenteritis 10/26/2011  . Hypokalemia 10/26/2011  . Elevated liver function tests 10/26/2011  . Acid reflux   . Thyroid disease    Past Medical History:  Diagnosis Date  . Acid reflux   . Arthritis    back and neck  . H/O hiatal hernia   . Headache(784.0)    migraines  . Hypercholesteremia   . Thyroid disease   . Thyroid nodule      Family History  Problem Relation Age of Onset  . Colon cancer Mother   . Pancreatic cancer Brother     Past Surgical History:  Procedure Laterality Date  . ABDOMINAL HYSTERECTOMY    . CHOLECYSTECTOMY    . ECTOPIC PREGNANCY SURGERY    . EUS  12/21/2011   Procedure: ESOPHAGEAL ENDOSCOPIC ULTRASOUND (EUS) RADIAL;  Surgeon: Willis Modena, MD;  Location: [facility] ENDOSCOPY;  Service: Endoscopy;  Laterality: N/A;  . EYE SURGERY     lasik   Social History   Occupational History  . Not on file.   Social History Main Topics  . Smoking status: Never Smoker  . Smokeless tobacco: Never Used  . Alcohol use Yes     Comment: occasionally  . Drug use: No  . Sexual activity: Yes    Birth control/ protection: None

## 2016-12-23 ENCOUNTER — Encounter: Payer: Self-pay | Admitting: Physical Therapy

## 2016-12-23 ENCOUNTER — Ambulatory Visit: Payer: Managed Care, Other (non HMO) | Attending: Neurosurgery | Admitting: Physical Therapy

## 2016-12-23 DIAGNOSIS — G8929 Other chronic pain: Secondary | ICD-10-CM | POA: Insufficient documentation

## 2016-12-23 DIAGNOSIS — M6281 Muscle weakness (generalized): Secondary | ICD-10-CM | POA: Insufficient documentation

## 2016-12-23 DIAGNOSIS — M545 Low back pain, unspecified: Secondary | ICD-10-CM

## 2016-12-23 NOTE — Therapy (Addendum)
[facility] [address] [place], [place], [address] Phone: [phone]   Fax:  [phone]  Physical Therapy Evaluation  Patient Details  Name: [name] [name] MRN: [id number] Date of Birth: [birth year] Referring Provider: Dr. Jovita Gamma  Encounter Date: 12/23/2016      PT End of Session - 12/23/16 0902    Visit Number 1   Date for PT Re-Evaluation 02/17/17   Authorization Type [employer]   Authorization - Visit Number 1   Authorization - Number of Visits 90   PT Start Time 0800   PT Stop Time 0900   PT Time Calculation (min) 60 min   Activity Tolerance Patient tolerated treatment well   Behavior During Therapy [facility] for tasks assessed/performed      Past Medical History:  Diagnosis Date  . Acid reflux   . Arthritis    back and neck  . H/O hiatal hernia   . Headache(784.0)    migraines  . Hypercholesteremia   . Thyroid disease   . Thyroid nodule     Past Surgical History:  Procedure Laterality Date  . ABDOMINAL HYSTERECTOMY    . CHOLECYSTECTOMY    . ECTOPIC PREGNANCY SURGERY    . EUS  12/21/2011   Procedure: ESOPHAGEAL ENDOSCOPIC ULTRASOUND (EUS) RADIAL;  Surgeon: Arta Silence, MD;  Location: [facility];  Service: Endoscopy;  Laterality: N/A;  . EYE SURGERY     lasik    There were no vitals filed for this visit.       Subjective Assessment - 12/23/16 0809    Subjective Patient reports her back started to hurt her in 1997 when taking care of her mom.  She lifted her mom and felt a pop.  Her back pain is off and on.  When get up she has to sit still before she gets up.    Limitations Sitting   How long can you sit comfortably? 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 Yes   Pain Score 8    Pain Location Back   Pain Orientation Lower   Pain  Descriptors / Indicators Aching   Pain Type Chronic pain   Pain Onset More than a month ago   Pain Frequency Intermittent   Aggravating Factors  sitting, when getting up in the morning   Pain Relieving Factors stand up and get moving   Multiple Pain Sites No            [facility] PT Assessment - 12/23/16 0001      Assessment   Medical Diagnosis M54.5 Low back pain   Referring Provider Dr. Jovita Gamma   Onset Date/Surgical Date 09/06/95   Prior Therapy sees a chiropractor for adjustments     Precautions   Precautions None     Restrictions   Weight Bearing Restrictions No     Balance Screen   Has the patient fallen in the past 6 months No   Has the patient had a decrease in activity level because of a fear of falling?  No   Is the patient reluctant to leave their home because of a fear of falling?  No     Home Ecologist residence     Prior Function   Level of Independence Independent  Cognition   Overall Cognitive Status Within Functional Limits for tasks assessed     Observation/Other Assessments   Focus on Therapeutic Outcomes (FOTO)  43% limitation  35% limitation     Posture/Postural Control   Posture/Postural Control Postural limitations   Postural Limitations Rounded Shoulders;Forward head     ROM / Strength   AROM / PROM / Strength AROM;Strength     AROM   Lumbar - Right Side Bend decreased by 25% limitation   Lumbar - Left Side Bend decreased by 25% limitation     Strength   Right Hip Extension 4/5   Right Hip ABduction 4/5   Left Hip Flexion 4/5   Left Hip ABduction 5/5     Palpation   Spinal mobility decreased mobiliyt of L1-L5   SI assessment  left ilium is rotated left; sacrum rotated left   Palpation comment left gluteal tenderness; decreased mobility of abdominal scar from hysterectomy     Special Tests   Sacroiliac Tests  Pelvic Compression     Pelvic Compression   Findings Positive   Side Left    comment pain     Transfers   Transfers Independent with all Transfers  moves slowly due to pain     Ambulation/Gait   Ambulation/Gait No     Berg Balance Test   Sit to Stand Able to stand without using hands and stabilize independently   Standing Unsupported Able to stand safely 2 minutes   Sitting with Back Unsupported but Feet Supported on Floor or Stool Able to sit safely and securely 2 minutes   Stand to Sit Sits safely with minimal use of hands   Transfers Able to transfer safely, minor use of hands   Standing Unsupported with Eyes Closed Able to stand 10 seconds safely   Standing Ubsupported with Feet Together Able to place feet together independently and stand 1 minute safely   From Standing, Reach Forward with Outstretched Arm Can reach confidently >25 cm (10")   From Standing Position, Pick up Object from Floor Able to pick up shoe safely and easily   From Standing Position, Turn to Look Behind Over each Shoulder Looks behind from both sides and weight shifts well   Turn 360 Degrees Able to turn 360 degrees safely in 4 seconds or less   Standing Unsupported, Alternately Place Feet on Step/Stool Able to stand independently and safely and complete 8 steps in 20 seconds   Standing Unsupported, One Foot in Front Able to place foot tandem independently and hold 30 seconds   Standing on One Leg Able to lift leg independently and hold > 10 seconds   Total Score 56                           PT Education - 12/23/16 0901    Education provided Yes   Education Details stretches and scar massage   Person(s) Educated Patient   Methods Explanation;Demonstration;Verbal cues;Handout   Comprehension Returned demonstration;Verbalized understanding          PT Short Term Goals - 12/23/16 0919      PT SHORT TERM GOAL #1   Title independent with initial HEP   Time 4   Period Weeks   Status New     PT SHORT TERM GOAL #2   Title understand correct body mechanics  with daily tasks to decrease strain on lumbar spine   Time 4   Period Weeks   Status  New     PT SHORT TERM GOAL #3   Title lumbar pain when getting out of bed decreased >/= 25% due to improved tissue mobility   Time 4   Period Weeks   Status New           PT Long Term Goals - 12/23/16 1308      PT LONG TERM GOAL #1   Title independent with HEP and how to progress herself   Time 8   Period Weeks   Status New     PT LONG TERM GOAL #2   Title lumbar pain with sitting decreased >/= 75% due to improved tissue mobility   Time 8   Period Weeks   Status New     PT LONG TERM GOAL #3   Title lumbar pain in the morning decreased >/= 75%   Time 8   Period Weeks   Status New     PT LONG TERM GOAL #4   Title FOTO score </= 35% limitation   Time 8   Period Weeks   Status New     PT LONG TERM GOAL #5   Title core strength and hip strength improved so patient feels her balance has improve >/= 50%   Time 8   Period Weeks   Status New               Plan - 12/23/16 0902    Clinical Impression Statement Patient is a 61 year old female with chronic low back pain since 1997 when she pickup her mother and felt a pop. Patient reports her back pain is a 8/10 intremittently when she gets up in the morning and sitting and turning in bed. Lumbar sidebending decreased by 25% bilaterally.  Left ilium is rotated posteriorly with sacrum rotated left.  Decreased mobility of L1-L5. Bilateral hip and abdominal strength decreased which could affect her balance.  Tightness located in left lumbar sacral area.  Hysterectomy scar has decreased mobility and tight mid scar. Patient is low complex evaluation due to an evolving condition and no comorbidities that could impact care provided. Patient will benefit from skilled therapy to reduce lumbar pain, improve lumbar mobility, improved mobility of hysterectomy scar and improve strength.    Rehab Potential Excellent   Clinical Impairments Affecting  Rehab Potential None   PT Frequency 1x / week   PT Duration 8 weeks   PT Treatment/Interventions Cryotherapy;Electrical Stimulation;Moist Heat;Traction;Ultrasound;Patient/family education;Neuromuscular re-education;Therapeutic exercise;Therapeutic activities;Balance training;Manual techniques;Scar mobilization;Passive range of motion;Dry needling;Taping   PT Next Visit Plan correct pelvis; joint mobilization to lumbar, core strength, scar massage for hysterectomy scar, hip strength, modalities as needed   PT Home Exercise Plan body mechanics   Recommended Other Services none   Consulted and Agree with Plan of Care Patient      Patient will benefit from skilled therapeutic intervention in order to improve the following deficits and impairments:  Decreased range of motion, Increased fascial restricitons, Increased muscle spasms, Pain, Decreased activity tolerance, Decreased scar mobility  Visit Diagnosis: Chronic bilateral low back pain without sciatica - Plan: PT plan of care cert/re-cert  Muscle weakness (generalized) - Plan: PT plan of care cert/re-cert     Problem List Patient Active Problem List   Diagnosis Date Noted  . 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 Snoring 03/25/2015  . 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 Acute pancreatitis 10/26/2011  . Nausea and vomiting in adult 10/26/2011  . Dehydration 10/26/2011  .  Gastroenteritis 10/26/2011  . Hypokalemia 10/26/2011  . Elevated liver function tests 10/26/2011  . Acid reflux   . Thyroid disease     Earlie Counts, PT 12/23/16 9:26 AM   [facility] [address], [place], [place], [address] Phone: [phone]   Fax:  [phone]  Name: LEILANY DIGERONIMO MRN: [id number] Date of Birth: 08-17-1956 PHYSICAL THERAPY DISCHARGE SUMMARY  Visits from Start of Care: 1  Current functional level related to goals /  functional outcomes: See above. Patient called and wanted to cancel all of her appointments without a reason.    Remaining deficits: See above.    Education / Equipment: None Plan: Patient agrees to discharge.  Patient goals were not met. Patient is being discharged due to the patient's request.  Thank you for the referral. cervical hyperextension sprain ?????

## 2016-12-23 NOTE — Patient Instructions (Addendum)
Knee-to-Chest Stretch: Bilateral    With hands behind knees, pull both knees in to chest until a comfortable stretch is felt in lower back and buttocks. Keep back relaxed. Hold __30__ seconds. Repeat _2___ times per set. Do __1__ sets per session. Do __1__ sessions per day.  http://orth.exer.us/128   Copyright  VHI. All rights reserved.    Knee-to-Chest Stretch: Unilateral    With hand behind right knee, pull knee in to chest until a comfortable stretch is felt in lower back and buttocks. Keep back relaxed. Hold __30__ seconds. Repeat _2___ times per set. Do _1___ sets per session. Do __1 sessions per day.  http://orth.exer.us/126   Copyright  VHI. All rights reserved.  Angry Cat Stretch    Tuck chin and tighten stomach, arching back. Repeat _10___ times per set. Do __1__ sets per session. Do _1___ sessions per day.  http://orth.exer.us/118   Copyright  VHI. All rights reserved.  Piriformis Stretch, Sitting    Sit, one ankle on opposite knee, same-side hand on crossed knee. Push down on knee, keeping spine straight. Lean torso forward, with flat back, until tension is felt in hamstrings and gluteals of crossed-leg side. Hold _30__ seconds.  Repeat _2__ times per session. Do _1Chair Sitting    Sit at edge of seat, spine straight, one leg extended. Put a hand on each thigh and bend forward from the hip, keeping spine straight. Allow hand on extended leg to reach toward toes. Support upper body with other arm. Hold _30__ seconds. Repeat _2__ times per session. Do 1___ sessions per day.  Copyright  VHI. All rights reserved.     Supine With Rotation    Lie on back with one knee drawn toward chest. Slowly bring bent leg across body until stretch is felt in lower back area. Hold __30_ seconds. Repeat to other side. Repeat _2__ times per session. Do _1__ sessions per day.  Copyright  VHI. All rights reserved.  Press-Up    Press upper body upward, keeping hips  in contact with floor. Keep lower back and buttocks relaxed. Hold ___1_ seconds. Repeat __5__ times per set. Do __1__ sets per session. Do __1__ sessions per day.  http://orth.exer.us/94   Copyright  VHI. All rights reserved.  Mid-Back Stretch    Push chest toward floor, reaching forward as far as possible. Hold _30___ seconds. Repeat __2__ times per set. Do _1___ sets per session. Do __1__ sessions per day.  http://orth.exer.us/130   Copyright  VHI. All rights reserved.  [facility] [address], [address] [place], [place] [address] Phone # [phone] Fax [phone]  Scar Massage  Scar massage is done to improve the mobility of scar, decrease scar tissue from building up, reduce adhesions, and prevent Keloids from forming. Start scar massage after scabs have fallen off by themselves and no open areas. The first few weeks after surgery, it is normal for a scar to appear pink or red and slightly raised. Scars can itch or have areas of numbness. Some scars may be sensitive.   Direct Scar massage: after scar is healed, no opening, no scab 1.  Place pads of two fingers together directly on the scar starting at one end of the scar. Move the fingers up and down across the scar holding 5 seconds one direction.  Then go opposite direction hold 5 seconds.  2. Move over to the next section of the scar and repeat.  Work your way along the entire length of the scar.   3. Next make  diagonal movements along the scar holding 5 seconds at one direction. 4. Next movement is side to side. 5. Do not rub fingers over the scar.  Instead keep firm pressure and move scar over the tissue it is on top   Scar Lift and Roll 12 weeks after surgery. 1. Pinch a small amount of the scar between your first two fingers and thumb.  2. Roll the scar between your fingers for 5 to 15 seconds. 3. Move along the scar and repeat until you have massaged the entire length of scar.   Stop the massage  and call your doctor if you notice: 1. Increased redness 2. Bleeding from scar 3. Seepage coming from the scar 4. Scar is warmer and has increased pain

## 2016-12-28 ENCOUNTER — Ambulatory Visit
Admission: RE | Admit: 2016-12-28 | Discharge: 2016-12-28 | Disposition: A | Discharge status: Home / Self Care | Payer: Managed Care, Other (non HMO) | Source: Ambulatory Visit | Attending: Surgery | Admitting: Surgery

## 2016-12-28 DIAGNOSIS — E041 Nontoxic single thyroid nodule: Secondary | ICD-10-CM

## 2016-12-29 ENCOUNTER — Encounter: Payer: Managed Care, Other (non HMO) | Admitting: Physical Therapy

## 2017-01-06 ENCOUNTER — Encounter: Payer: Managed Care, Other (non HMO) | Admitting: Physical Therapy

## 2017-01-12 ENCOUNTER — Encounter: Payer: Managed Care, Other (non HMO) | Admitting: Physical Therapy

## 2017-01-20 ENCOUNTER — Encounter: Payer: Managed Care, Other (non HMO) | Admitting: Physical Therapy

## 2017-01-25 ENCOUNTER — Ambulatory Visit (INDEPENDENT_AMBULATORY_CARE_PROVIDER_SITE_OTHER): Payer: Managed Care, Other (non HMO) | Admitting: Orthopaedic Surgery

## 2017-01-26 ENCOUNTER — Encounter: Payer: Managed Care, Other (non HMO) | Admitting: Physical Therapy

## 2017-02-02 ENCOUNTER — Encounter: Payer: Managed Care, Other (non HMO) | Admitting: Physical Therapy

## 2017-02-21 ENCOUNTER — Encounter: Payer: Self-pay | Admitting: Nurse Practitioner

## 2017-02-23 ENCOUNTER — Encounter: Payer: Self-pay | Admitting: Nurse Practitioner

## 2017-02-23 ENCOUNTER — Ambulatory Visit (INDEPENDENT_AMBULATORY_CARE_PROVIDER_SITE_OTHER): Payer: Managed Care, Other (non HMO) | Admitting: Nurse Practitioner

## 2017-02-23 DIAGNOSIS — G4733 Obstructive sleep apnea (adult) (pediatric): Secondary | ICD-10-CM | POA: Diagnosis not present

## 2017-02-23 DIAGNOSIS — Z9989 Dependence on other enabling machines and devices: Secondary | ICD-10-CM | POA: Diagnosis not present

## 2017-02-23 NOTE — Progress Notes (Signed)
[facility]  PATIENT: [name] [name] DOB: [birth year]   REASON FOR VISIT: Obstructive sleep apnea needs CPAP supplies HISTORY FROM: Patient    HISTORY OF PRESENT ILLNESS:UPDATE 06/21/2018CM [name] [name], 61 year old female returns for follow-up with her last visit being July 2016. She did not show for follow-up appointments. She needs new CPAP supplies and a compliance report. 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 Her CPAP report from 01/23/2017 to 02/21/2017 greater than 4 hours usage 27 days for 90%. Average usage 5 hours 50 minutes. Pressure 5 cm minimum and max 15. EPR full-time level III AHI 1.3. She has an appointment with Dr. Vickey Huger on July 24 to address the weakness in the left lower extremity. She returns for reevaluation 03/25/15 CDMrs. [name] is a right-handed African-American female who was originally reported referred by Dr. Chestine Spore to Dr. Naomie Dean was a question of reasons for in balance or balance problems. The patient reports that these are more likely to spells they're not constantly present but with initiation of movement she sometimes feels as if her legs buckle at times she gets short of breath with exertion but it is not everyday the same. She's not sure that there are any other triggers she has sometimes dizziness or lightheadedness with postural changes standing up from a seated position or beginning to walk. Sometimes she has felt as if her heart is fluttering. She also reports that when she laughs really hard her vision may get blurred and she feels as if she could faint. She had similar symptoms when she strains or performs any Valsalva maneuver. She states that when she is angry or otherwise excited she would usually not have the symptoms of leg buckling and getting lightheaded. Dr. Lucia Gaskins that when she is laughing very hard she feels like she may go to pass out she suspected that the patient may have  cataplexy. She is also excessively tired after lunch times falls asleep inadvertently or has the irresistible urge to fall asleep. She has also reportedly snored very loudly and sometimes she has morning headaches or even headaches at night when she wakes up. Describes them as a global pressure sensation not migrainous in nature. Tried to not sure and shake her to stop her from snoring any further.  Her sleep habits are as follows she has variable bedtimes anytime between 9:30 and 11:30. She rises in the morning 5 AM. He lies on an alarm to wake up in the morning. She doesn't always feel refreshed or restored. She actually rarely does. She describes no evidence for sleep paralysis or dream intrusion actually she's not sure if she dreams at all. She prefers to sleep on her left side, she always wakes up on her back. This is also the position she most likely snores . As been has not witnessed any acting out of dreams kicking and thrashing sleepwalking or sleep talking. There is also no childhood history of sleep disorders or family history.  She has an irresistible urge to sleep, drinks cold water, walks about the office, all in order to avoid falling asleep.  There will be some nights that she is extremely sleep deprived by the length of sleep provided. 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 She has no natural daylight in her office environment. She has gained some weight over the last years, but minimally - she can  still wear the same jeans , but her midsection is larger.   REVIEW OF SYSTEMS: Full 14 system review of systems performed and notable only for those listed, all others are neg:  Constitutional: Fatigue  Cardiovascular: Palpitations Ear/Nose/Throat: Hearing loss  Skin: neg Eyes: neg Respiratory: neg Gastroitestinal: neg  Hematology/Lymphatic: neg  Endocrine: neg Musculoskeletal: Joint pain Allergy/Immunology:  neg Neurological: neg Psychiatric: neg Sleep : neg   ALLERGIES: Allergies  Allergen Reactions  . Morphine And Related Shortness Of Breath  . Sulfa Antibiotics Hives    HOME MEDICATIONS: Outpatient Medications Prior to Visit  Medication Sig Dispense Refill  . b complex vitamins tablet Take 1 tablet by mouth daily.    Marland Kitchen ibuprofen (ADVIL,MOTRIN) 200 MG tablet Take 400 mg by mouth every 6 (six) hours as needed for moderate pain.    Marland Kitchen HYDROcodone-acetaminophen (NORCO/VICODIN) 5-325 MG tablet Take 1-2 tablets by mouth every 6 hours as needed for pain and/or cough. (Patient not taking: Reported on 02/23/2017) 11 tablet 0  . Multiple Vitamin (MULTIVITAMIN) LIQD Take 15 mLs by mouth daily.    Marland Kitchen neomycin-polymyxin-hydrocortisone (CORTISPORIN) otic solution Place 4 drops into the left ear 4 (four) times daily as needed (for ear pain).    Marland Kitchen oseltamivir (TAMIFLU) 75 MG capsule Take 1 capsule (75 mg total) by mouth every 12 (twelve) hours. (Patient not taking: Reported on 02/23/2017) 10 capsule 0   No facility-administered medications prior to visit.     PAST MEDICAL HISTORY: Past Medical History:  Diagnosis Date  . Acid reflux   . Arthritis    back and neck  . H/O hiatal hernia   . Headache(784.0)    migraines  . Hypercholesteremia   . Thyroid disease   . Thyroid nodule     PAST SURGICAL HISTORY: Past Surgical History:  Procedure Laterality Date  . ABDOMINAL HYSTERECTOMY    . CHOLECYSTECTOMY    . ECTOPIC PREGNANCY SURGERY    . EUS  12/21/2011   Procedure: ESOPHAGEAL ENDOSCOPIC ULTRASOUND (EUS) RADIAL;  Surgeon: Willis Modena, MD;  Location: [facility];  Service: Endoscopy;  Laterality: N/A;  . EYE SURGERY     lasik    FAMILY HISTORY: Family History  Problem Relation Age of Onset  . Colon cancer Mother   . Pancreatic cancer Brother     SOCIAL HISTORY: Social History   Social History  . Marital status: Married    Spouse name: N/A  . Number of children: N/A  . Years  of education: N/A   Occupational History  . Not on file.   Social History Main Topics  . Smoking status: Never Smoker  . Smokeless tobacco: Never Used  . Alcohol use Yes     Comment: occasionally  . Drug use: No  . Sexual activity: Yes    Birth control/ protection: None   Other Topics Concern  . Not on file   Social History Narrative   Married.   Drinks occasional sweet tea.   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        PHYSICAL EXAM  Vitals:   02/23/17 0814  BP: 115/76  Pulse: 76  Weight: 196 lb (88.9 kg)  Height: 5' 6.5" (1.689 m)   Body mass index is 31.16 kg/m.  Generalized: Well developed, in no acute distress  Head: normocephalic and atraumatic,. Oropharynx benign  Neck: Supple,   Musculoskeletal: No deformity   Neurological examination   Mentation: Alert oriented to time, place, history taking. Attention span and concentration appropriate.  Recent and remote memory intact.  Follows all commands speech and language fluent.   Cranial nerve II-XII: .Pupils were equal round reactive to light extraocular movements were full, visual field were full on confrontational test. Facial sensation and strength were normal. hearing was intact to finger rubbing bilaterally. Uvula tongue midline. head turning and shoulder shrug were normal and symmetric.Tongue protrusion into cheek strength was normal. Motor: normal bulk and tone, full strength in the BUE, BLE,  Sensory: normal and symmetric to light touch, Coordination: finger-nose-finger, heel-to-shin bilaterally, no dysmetria Gait and Station: Rising up from seated position without assistance, normal stance,  moderate stride, good arm swing, smooth turning, able to perform tiptoe, and heel walking without difficulty. Tandem gait is steady  DIAGNOSTIC DATA (LABS, IMAGING, TESTING) - I reviewed patient records, labs, notes, testing and imaging myself where available.  Lab Results  Component Value  Date   WBC 7.6 12/14/2015   HGB 13.2 12/14/2015   HCT 38.4 12/14/2015   MCV 82.1 12/14/2015   PLT 224 12/14/2015      Component Value Date/Time   NA 141 12/14/2015 1855   K 4.0 12/14/2015 1855   CL 108 12/14/2015 1855   CO2 24 12/14/2015 1855   GLUCOSE 97 12/14/2015 1855   BUN 15 12/14/2015 1855   CREATININE 0.80 12/14/2015 1855   CALCIUM 9.4 12/14/2015 1855   PROT 6.2 10/26/2011 0310   ALBUMIN 3.4 (L) 10/26/2011 0310   AST 46 (H) 10/26/2011 0310   ALT 115 (H) 10/26/2011 0310   ALKPHOS 84 10/26/2011 0310   BILITOT 1.1 10/26/2011 0310   GFRNONAA >60 12/14/2015 1855   GFRAA >60 12/14/2015 1855    ASSESSMENT AND PLAN  61 y.o. year old female  has a past medical history of sleep apnea with CPAP here to follow-up for CPAP compliance and CPAP supplies.CPAP report from 01/23/2017 to 02/21/2017 greater than 4 hours usage 27 days for 90%. Average usage 5 hours 50 minutes. Pressure 5 cm minimum and max 15. EPR full-time level 3 AHI 1.3.The patient is a current patient of Dr. Vickey Hugerohmeier  who is out of the office today . This note is sent to the work in doctor.      CPAP compliance is 90% Will reorder CPAP supplies Follow-up with Dr. Vickey Hugerohmeier is planned Nilda RiggsNancy Carolyn Elya Tarquinio, [facility] Surgery CenterGNP, [facility] PLLCBC, APRN  [facility] [address] [place], [place] [address] [phone]

## 2017-02-23 NOTE — Patient Instructions (Signed)
CPAP compliance Will reorder CPAP supplies Follow-up with Dr. Vickey Hugerohmeier is planned

## 2017-02-23 NOTE — Progress Notes (Signed)
I have read the note, and I agree with the clinical assessment and plan.  Nasean Zapf A. Abriella Filkins, MD, PhD, FAAN Certified in Neurology, Clinical Neurophysiology, Sleep Medicine, Pain Medicine and Neuroimaging  [facility] [address], [address] [place], [place] [address] [phone]  

## 2017-03-25 ENCOUNTER — Ambulatory Visit (HOSPITAL_COMMUNITY)
Admission: EM | Admit: 2017-03-25 | Discharge: 2017-03-25 | Disposition: A | Discharge status: Home / Self Care | Payer: Managed Care, Other (non HMO) | Attending: Family Medicine | Admitting: Family Medicine

## 2017-03-25 ENCOUNTER — Encounter (HOSPITAL_COMMUNITY): Payer: Self-pay | Admitting: Emergency Medicine

## 2017-03-25 DIAGNOSIS — H6982 Other specified disorders of Eustachian tube, left ear: Secondary | ICD-10-CM

## 2017-03-25 DIAGNOSIS — J029 Acute pharyngitis, unspecified: Secondary | ICD-10-CM | POA: Insufficient documentation

## 2017-03-25 DIAGNOSIS — H6992 Unspecified Eustachian tube disorder, left ear: Secondary | ICD-10-CM | POA: Diagnosis not present

## 2017-03-25 LAB — POCT RAPID STREP A: Streptococcus, Group A Screen (Direct): NEGATIVE

## 2017-03-25 MED ORDER — FLUTICASONE PROPIONATE 50 MCG/ACT NA SUSP: 2.0000 | Freq: Every day | NASAL | 0 refills | Status: DC

## 2017-03-25 NOTE — ED Provider Notes (Addendum)
[facility]    CSN: [id number] Arrival date & time: 03/25/17  4098     History   Chief Complaint Chief Complaint  Patient presents with  . Sore Throat    HPI [name] [name] is a 61 y.o. female.   HPI  Duration: 3 days  Associated symptoms: sinus congestion, rhinorrhea, ear pain and sore throat; she cites chronic clear drainage from ears which she was told not to worry about Denies: sinus pain, itchy watery eyes, red eyes, shortness of breath and fevers/rigors Treatment to date: Advil Sick contacts: No    Past Medical History:  Diagnosis Date  . Acid reflux   . Arthritis    back and neck  . H/O hiatal hernia   . Headache(784.0)    migraines  . Hypercholesteremia   . Thyroid disease   . Thyroid nodule     Patient Active Problem List   Diagnosis Date Noted  . Obstructive sleep apnea on CPAP 02/23/2017  . 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 Snoring 03/25/2015  . 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 Acute pancreatitis 10/26/2011  . Nausea and vomiting in adult 10/26/2011  . Dehydration 10/26/2011  . Gastroenteritis 10/26/2011  . Hypokalemia 10/26/2011  . Elevated liver function tests 10/26/2011  . Acid reflux   . Thyroid disease     Past Surgical History:  Procedure Laterality Date  . ABDOMINAL HYSTERECTOMY    . CHOLECYSTECTOMY    . ECTOPIC PREGNANCY SURGERY    . EUS  12/21/2011   Procedure: ESOPHAGEAL ENDOSCOPIC ULTRASOUND (EUS) RADIAL;  Surgeon: Willis Modena, MD;  Location: [facility];  Service: Endoscopy;  Laterality: N/A;  . EYE SURGERY     lasik    OB History    No data available       Home Medications    Prior to Admission medications   Medication Sig Start Date End Date Taking? Authorizing Provider  Cholecalciferol (VITAMIN D3) 50000 units TABS Take by mouth.   Yes [provider]  conjugated estrogens (PREMARIN) vaginal cream Place 1 Applicatorful vaginally  daily.   Yes [provider]  b complex vitamins tablet Take 1 tablet by mouth daily.    [provider]  calcium carbonate (OSCAL) 1500 (600 Ca) MG TABS tablet Take by mouth daily with breakfast. One daily    [provider]  fluticasone (FLONASE) 50 MCG/ACT nasal spray Place 2 sprays into both nostrils daily. 03/25/17   Sharlene Dory, DO  ibuprofen (ADVIL,MOTRIN) 200 MG tablet Take 400 mg by mouth every 6 (six) hours as needed for moderate pain.    [provider]  Omega-3 Fatty Acids (FISH OIL) 1000 MG CAPS Take by mouth. One daily    [provider]    Family History Family History  Problem Relation Age of Onset  . Colon cancer Mother   . Pancreatic cancer Brother     Social History Social History  Substance Use Topics  . Smoking status: Never Smoker  . Smokeless tobacco: Never Used  . Alcohol use Yes     Comment: occasionally     Allergies   Morphine and related and Sulfa antibiotics   Review of Systems Review of Systems  Constitutional: Negative for fever.  HENT: Positive for sore throat.      Physical Exam Updated Vital Signs BP 126/72 (BP Location: Left Arm)   Pulse 73   Temp 98.7 F (37.1 C) (Oral)   Resp 18  SpO2 98%   Physical Exam  Constitutional: She appears well-developed and well-nourished.  HENT:  Head: Normocephalic and atraumatic.  Right Ear: External ear normal.  Left Ear: External ear normal.  Nose: Nose normal.  Mouth/Throat: Oropharynx is clear and moist. No oropharyngeal exudate.  Eyes: Pupils are equal, round, and reactive to light. EOM are normal.  Neck: Normal range of motion. Neck supple.  Cardiovascular: Normal rate and regular rhythm.   No murmur heard. Pulmonary/Chest: Effort normal and breath sounds normal. No respiratory distress.  Lymphadenopathy:    She has no cervical adenopathy.  Psychiatric: She has a normal mood and affect. Judgment normal.     UC Treatments /  Results  Labs (all labs ordered are listed, but only abnormal results are displayed) Labs Reviewed  CULTURE, GROUP A STREP [facility]  POCT RAPID STREP A   Procedures Procedures none  Initial Impression / Assessment and Plan / UC Course  I have reviewed the triage vital signs and the nursing notes.  Pertinent labs & imaging results that were available during my care of the patient were reviewed by me and considered in my medical decision making (see chart for details).     Patient presents with unilateral sore throat. Rapid strep is negative. She had 2/4 Centor criteria. I do not see any evidence on exam of a tonsillar abscess. Continue anti-inflammatories, throat lozenges, salt water gargles, and pushing fluids. Regarding her ear, likely ETD, recommended INCS. Follow-up with PCP if symptoms fail to improve. The patient is to be discharged in stable condition. She voiced understanding and agreement to the plan.  Final Clinical Impressions(s) / UC Diagnoses   Final diagnoses:  Pharyngitis, unspecified etiology  Dysfunction of left eustachian tube    New Prescriptions Discharge Medication List as of 03/25/2017  7:59 PM    START taking these medications   Details  fluticasone (FLONASE) 50 MCG/ACT nasal spray Place 2 sprays into both nostrils daily., Starting Sat 03/25/2017, Normal         Sharlene DoryWendling, Nicholas Paul, DO 03/25/17 2111    Sharlene DoryWendling, Nicholas Paul, DO 03/25/17 2113

## 2017-03-25 NOTE — ED Triage Notes (Signed)
Pain on left side of throat with swallowing and pain below left ear.  Patient reports congestion prior to having a sore throat that required clearing of throat frequently.    Patient reports recently left eye infection requiring eye drops to treat

## 2017-03-25 NOTE — Discharge Instructions (Signed)
Continue ibuprofen, salt water gargles as needed, throat lozenges.   Flonase is also available over the counter. 2 sprays each nostril once daily. It will not work right away.  Seek care if you start having fevers, shortness of breath, or worsening symptoms.

## 2017-03-27 ENCOUNTER — Encounter: Payer: Self-pay | Admitting: Neurology

## 2017-03-28 ENCOUNTER — Ambulatory Visit (INDEPENDENT_AMBULATORY_CARE_PROVIDER_SITE_OTHER): Payer: Managed Care, Other (non HMO) | Admitting: Neurology

## 2017-03-28 VITALS — BP 121/81 | HR 71 | Ht 66.0 in | Wt 198.5 lb

## 2017-03-28 DIAGNOSIS — R200 Anesthesia of skin: Secondary | ICD-10-CM | POA: Diagnosis not present

## 2017-03-28 DIAGNOSIS — M62559 Muscle wasting and atrophy, not elsewhere classified, unspecified thigh: Secondary | ICD-10-CM | POA: Diagnosis not present

## 2017-03-28 DIAGNOSIS — R2681 Unsteadiness on feet: Secondary | ICD-10-CM | POA: Diagnosis not present

## 2017-03-28 LAB — CULTURE, GROUP A STREP (THRC)

## 2017-03-28 NOTE — Progress Notes (Signed)
[facility]  PATIENT: [name] [name] DOB: 10-27-1955   REASON FOR VISIT: Obstructive sleep apnea needs CPAP supplies HISTORY FROM: Patient   Interval history from 03/28/2017,  I had the pleasure of seeing [name] [name] who has been an established CPAP user.  The patient had reported an increasing level of fatigue when she spoke to her primary care provider, Dr. Margaretmary Bayley, and to her cardiologist, Dr. Jacinto Halim.  Both asked her to follow up with me. She was just seen on 02/23/2017 with a high compliance with CPAP use, low residual AHI of 1.3, and no need for changes in the settings was seen. The patient reports rare palpitations not necessarily at night, and she has not make the connection to caffeine intake yet.  She states that she drinks one glass of tea ice tea, and that 3 times a week. She is not a soda drinker and she does not drink coffee. She is also not as sleepy as she used to be before CPAP was initiated. She is here to be evaluated for left leg weakness and numbness.  She reports that the numbness and weakness has been happening off and on for years. She has never fallen, she has no pain in the leg. She feels that the weakness is related to the knee joint. She also reports that her right knee is now starting to bother her as well. The symptoms are mostly felt when she is rising up from a seated position starting to move. She feels as if they could give out. She feels the" knees are big". She reports loss of muscle bulk at the hip flexors, quadriceps.     HISTORY OF PRESENT ILLNESS:UPDATE 02/23/2017 CM Ms. [name] [name], 61 year old female returns for follow-up with her last visit being July 2016. She did not show for follow-up appointments. She needs new CPAP supplies and a compliance report. 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 Her CPAP report from 01/23/2017 to 02/21/2017 greater than 4 hours usage 27 days for 90%.  Average usage 5 hours 50 minutes. Pressure 5 cm minimum and max 15. EPR full-time level III AHI 1.3. She has an appointment with Dr. Vickey Huger on July 24 to address the weakness in the left lower extremity. She returns for reevaluation. here to follow-up for CPAP compliance and CPAP supplies.CPAP report from 01/23/2017 to 02/21/2017 greater than 4 hours usage 27 days for 90%. Average usage 5 hours 50 minutes. Pressure 5 cm minimum and max 15. EPR full-time level 3 AHI 1.3.The patient is a current patient of Dr. Vickey Huger  who is out of the office today . This note is sent to the work in doctor.     03/25/15 CDMrs. [name] [name] is a right-handed African-American female who was originally reported referred by Dr. Chestine Spore to Dr. Naomie Dean with a question of imbalance or balance problems.  The patient reports that these are more likely to spells they're not constantly present but with initiation of movement she sometimes feels as if her legs buckle at times she gets short of breath with exertion but it is not everyday the same.  She's not sure that there are any other triggers she has sometimes dizziness or lightheadedness with postural changes standing up from a seated position or beginning to walk. Sometimes she has felt as if her heart is fluttering. She also reports that when she laughs really hard her vision may get blurred and she feels as if she could  faint. She had similar symptoms when she strains or performs any Valsalva maneuver.  She states that when she is angry or otherwise excited she would usually not have the symptoms of leg buckling and getting lightheaded. Dr. Lucia Gaskins that when she is laughing very hard she feels like she may go to pass out she suspected that the patient may have cataplexy. She is also excessively tired after lunch times falls asleep inadvertently or has the irresistible urge to fall asleep. She has also reportedly snored very loudly and sometimes she has morning headaches or even  headaches at night when she wakes up. Describes them as a global pressure sensation not migrainous in nature. Tried to not sure and shake her to stop her from snoring any further.  Her sleep habits are as follows she has variable bedtimes anytime between 9:30 and 11:30. She rises in the morning 5 AM. He lies on an alarm to wake up in the morning. She doesn't always feel refreshed or restored. She actually rarely does. She describes no evidence for sleep paralysis or dream intrusion actually she's not sure if she dreams at all. She prefers to sleep on her left side, she always wakes up on her back. This is also the position she most likely snores . As been has not witnessed any acting out of dreams kicking and thrashing sleepwalking or sleep talking. There is also no childhood history of sleep disorders or family history. She has an irresistible urge to sleep, drinks cold water, walks about the office, all in order to avoid falling asleep.  There will be some nights that she is extremely sleep deprived by the length of sleep provided. 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 She has no natural daylight in her office environment. She has gained some weight over the last years, but minimally - she can still wear the same jeans , but her midsection is larger.   REVIEW OF SYSTEMS: Full 14 system review of systems performed and notable only for those listed, all others are neg:  Joint pain, stiffness, leg buckles. Arthritis.  OSA on CPAP.the patient described numbness coming and going on the lower extremity below the knee and involving the dorsum pedis. The bottom of the foot feels normal.  "Shooting sensation" involving the interspace between hallux and second toe.   ALLERGIES: Allergies  Allergen Reactions  . Morphine And Related Shortness Of Breath  . Sulfa Antibiotics Hives    HOME MEDICATIONS: Outpatient Medications Prior to  Visit  Medication Sig Dispense Refill  . b complex vitamins tablet Take 1 tablet by mouth daily.    . calcium carbonate (OSCAL) 1500 (600 Ca) MG TABS tablet Take by mouth daily with breakfast. One daily    . Cholecalciferol (VITAMIN D3) 50000 units TABS Take by mouth.    . conjugated estrogens (PREMARIN) vaginal cream Place 1 Applicatorful vaginally daily.    . fluticasone (FLONASE) 50 MCG/ACT nasal spray Place 2 sprays into both nostrils daily. 16 g 0  . ibuprofen (ADVIL,MOTRIN) 200 MG tablet Take 400 mg by mouth every 6 (six) hours as needed for moderate pain.    . Omega-3 Fatty Acids (FISH OIL) 1000 MG CAPS Take by mouth. One daily     No facility-administered medications prior to visit.     PAST MEDICAL HISTORY: Past Medical History:  Diagnosis Date  . Acid reflux   . Arthritis  back and neck  . H/O hiatal hernia   . Headache(784.0)    migraines  . Hypercholesteremia   . Thyroid disease   . Thyroid nodule     PAST SURGICAL HISTORY: Past Surgical History:  Procedure Laterality Date  . ABDOMINAL HYSTERECTOMY    . CHOLECYSTECTOMY    . ECTOPIC PREGNANCY SURGERY    . EUS  12/21/2011   Procedure: ESOPHAGEAL ENDOSCOPIC ULTRASOUND (EUS) RADIAL;  Surgeon: Willis Modena, MD;  Location: [facility];  Service: Endoscopy;  Laterality: N/A;  . EYE SURGERY     lasik    FAMILY HISTORY: Family History  Problem Relation Age of Onset  . Colon cancer Mother   . Pancreatic cancer Brother     SOCIAL HISTORY: Social History   Social History  . Marital status: Married    Spouse name: N/A  . Number of children: N/A  . Years of education: N/A   Occupational History  . Not on file.   Social History Main Topics  . Smoking status: Never Smoker  . Smokeless tobacco: Never Used  . Alcohol use Yes     Comment: occasionally  . Drug use: No  . Sexual activity: Yes    Birth control/ protection: None   Other Topics Concern  . Not on file   Social History Narrative   Married.    Drinks occasional sweet tea.   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        PHYSICAL EXAM  Vitals:   03/28/17 1326  BP: 121/81  Pulse: 71  Weight: 198 lb 8 oz (90 kg)  Height: 5\' 6"  (1.676 m)   Body mass index is 32.04 kg/m.  Generalized: Well developed, in no acute distress  Head: normocephalic and atraumatic,. Oropharynx benign  Neck: Supple,   Musculoskeletal: No deformity   Neurological examination   Mentation: Alert oriented to time, place, history taking. Attention span and concentration appropriate. Recent and remote memory intact.  Follows all commands speech and language fluent.   Cranial nerve ; sense of taste and smell intact. .Pupils were equal round reactive to light extraocular movements were full,  visual field were full on confrontational test. Facial sensation and strength were normal. hearing was intact to finger rubbing bilaterally. Uvula tongue midline. head turning and shoulder shrug were normal and symmetric.Tongue protrusion into cheek strength was normal. Motor: normal bulk and tone, full strength - I can see that the left quadriceps femoris is slightly smaller, but not how recent this is.  Sensory: normal and symmetric to light touch, Coordination: finger-nose-finger, heel to shin bilaterally, no dysmetria Gait and Station: Rising up from seated position without assistance, not bracing herself.  normal stance,  moderate stride, good arm swing, smooth turning, . DIAGNOSTIC DATA (LABS, IMAGING, TESTING) - I reviewed patient records, labs, notes, testing and imaging myself where available.  Lab Results  Component Value Date   WBC 7.6 12/14/2015   HGB 13.2 12/14/2015   HCT 38.4 12/14/2015   MCV 82.1 12/14/2015   PLT 224 12/14/2015      Component Value Date/Time   NA 141 12/14/2015 1855   K 4.0 12/14/2015 1855   CL 108 12/14/2015 1855   CO2 24 12/14/2015 1855   GLUCOSE 97 12/14/2015 1855   BUN 15 12/14/2015 1855    CREATININE 0.80 12/14/2015 1855   CALCIUM 9.4 12/14/2015 1855   PROT 6.2 10/26/2011 0310   ALBUMIN 3.4 (L) 10/26/2011 0310   AST 46 (H) 10/26/2011 0310  ALT 115 (H) 10/26/2011 0310   ALKPHOS 84 10/26/2011 0310   BILITOT 1.1 10/26/2011 0310   GFRNONAA >60 12/14/2015 1855   GFRAA >60 12/14/2015 1855    ASSESSMENT AND PLAN  61 y.o. year old afro-american  female Patient. She  has a past medical history of sleep apnea with CPAP, she presents with unsteadiness, feeling as if legs( knees) buckle. Has never fallen.  I will order NCV / EMG for left leg  MRi lower back and sacrum.   Melvyn Novasarmen Laylia Mui, MD    [facility] [address], [address] [place], [place] [address] [phone]

## 2017-03-28 NOTE — Patient Instructions (Signed)
Spinal Stenosis Spinal stenosis happens when the open space (spinal canal) between the bones of your spine (vertebrae) gets smaller. It is caused by bone pushing into the open spaces of your backbone (spine). This puts pressure on your backbone and the nerves in your backbone. Treatment often focuses on managing any pain and symptoms. In some cases, surgery may be needed. Follow these instructions at home: Managing pain, stiffness, and swelling  Do all exercises and stretches as told by your doctor.  Stand and sit up straight (use good posture). If you were given a brace or a corset, wear it as told by your doctor.  Do not do any activities that cause pain. Ask your doctor what activities are safe for you.  Do not lift anything that is heavier than 10 lb (4.5 kg) or heavier than your doctor tells you.  Try to stay at a healthy weight. Talk with your doctor if you need help losing weight.  If directed, put heat on the affected area as often as told by your doctor. Use the heat source that your doctor recommends, such as a moist heat pack or a heating pad. ? Put a towel between your skin and the heat source. ? Leave the heat on for 20-30 minutes. ? Remove the heat if your skin turns bright red. This is especially important if you are not able to feel pain, heat, or cold. You may have a greater risk of getting burned. General instructions  Take over-the-counter and prescription medicines only as told by your doctor.  Do not use any products that contain nicotine or tobacco, such as cigarettes and e-cigarettes. If you need help quitting, ask your doctor.  Eat a healthy diet. This includes plenty of fruits and vegetables, whole grains, and low-fat (lean) protein.  Keep all follow-up visits as told by your doctor. This is important. Contact a doctor if:  Your symptoms do not get better.  Your symptoms get worse.  You have a fever. Get help right away if:  You have new or worse pain in  your neck or upper back.  You have very bad pain that medicine does not control.  You are dizzy.  You have vision problems, blurred vision, or double vision.  You have a very bad headache that is worse when you stand.  You feel sick to your stomach (nauseous).  You throw up (vomit).  You have new or worse numbness or tingling in your back or legs.  You have pain, redness, swelling, or warmth in your arm or leg. Summary  Spinal stenosis happens when the open space (spinal canal) between the bones of your spine gets smaller (narrow).  Contact a doctor if your symptoms get worse.  In some cases, surgery may be needed. This information is not intended to replace advice given to you by your health care provider. Make sure you discuss any questions you have with your health care provider. Document Released: 12/16/2010 Document Revised: 07/27/2016 Document Reviewed: 07/27/2016 Elsevier Interactive Patient Education  2017 [employer]vier Inc[employer]  

## 2017-04-05 ENCOUNTER — Telehealth: Payer: Self-pay | Admitting: Neurology

## 2017-04-05 ENCOUNTER — Encounter: Payer: Managed Care, Other (non HMO) | Admitting: Neurology

## 2017-04-05 NOTE — Telephone Encounter (Signed)
This patient did not show for a EMG and nerve conduction study appointment today. 

## 2017-04-06 ENCOUNTER — Encounter: Payer: Self-pay | Admitting: Neurology

## 2017-05-31 ENCOUNTER — Other Ambulatory Visit: Payer: Self-pay | Admitting: Obstetrics and Gynecology

## 2017-05-31 DIAGNOSIS — Z1231 Encounter for screening mammogram for malignant neoplasm of breast: Secondary | ICD-10-CM

## 2017-06-23 ENCOUNTER — Ambulatory Visit: Payer: Managed Care, Other (non HMO)

## 2017-06-28 ENCOUNTER — Ambulatory Visit: Payer: Managed Care, Other (non HMO) | Admitting: Nurse Practitioner

## 2017-06-30 ENCOUNTER — Ambulatory Visit
Admission: RE | Admit: 2017-06-30 | Discharge: 2017-06-30 | Disposition: A | Discharge status: Home / Self Care | Payer: Managed Care, Other (non HMO) | Source: Ambulatory Visit | Attending: Obstetrics and Gynecology | Admitting: Obstetrics and Gynecology

## 2017-06-30 DIAGNOSIS — Z1231 Encounter for screening mammogram for malignant neoplasm of breast: Secondary | ICD-10-CM

## 2017-10-21 ENCOUNTER — Other Ambulatory Visit: Payer: Self-pay

## 2017-10-21 ENCOUNTER — Encounter (HOSPITAL_COMMUNITY): Payer: Self-pay | Admitting: Emergency Medicine

## 2017-10-21 DIAGNOSIS — R6884 Jaw pain: Secondary | ICD-10-CM | POA: Insufficient documentation

## 2017-10-21 DIAGNOSIS — H9201 Otalgia, right ear: Secondary | ICD-10-CM | POA: Insufficient documentation

## 2017-10-21 DIAGNOSIS — Z5321 Procedure and treatment not carried out due to patient leaving prior to being seen by health care provider: Secondary | ICD-10-CM | POA: Insufficient documentation

## 2017-10-21 NOTE — ED Triage Notes (Signed)
Pt to ED with c/o right jaw pain, onset yesterday.  Pt denies any cold symptoms or dental problems.  St's pain also radiates into right ear

## 2017-10-22 ENCOUNTER — Emergency Department (HOSPITAL_COMMUNITY)
Admission: EM | Admit: 2017-10-22 | Discharge: 2017-10-22 | Discharge status: Against Medical Advice | Payer: Managed Care, Other (non HMO) | Attending: Emergency Medicine | Admitting: Emergency Medicine

## 2017-10-22 ENCOUNTER — Emergency Department (HOSPITAL_COMMUNITY)
Admission: EM | Admit: 2017-10-22 | Discharge: 2017-10-22 | Disposition: A | Discharge status: Home / Self Care | Payer: Managed Care, Other (non HMO) | Source: Home / Self Care | Attending: Emergency Medicine | Admitting: Emergency Medicine

## 2017-10-22 ENCOUNTER — Encounter (HOSPITAL_COMMUNITY): Payer: Self-pay

## 2017-10-22 ENCOUNTER — Other Ambulatory Visit: Payer: Self-pay

## 2017-10-22 DIAGNOSIS — R51 Headache: Secondary | ICD-10-CM | POA: Insufficient documentation

## 2017-10-22 DIAGNOSIS — Z79899 Other long term (current) drug therapy: Secondary | ICD-10-CM

## 2017-10-22 DIAGNOSIS — R519 Headache, unspecified: Secondary | ICD-10-CM

## 2017-10-22 MED ORDER — HYDROCODONE-ACETAMINOPHEN 5-325 MG PO TABS: 1.0000 | ORAL_TABLET | ORAL | 0 refills | Status: DC | PRN

## 2017-10-22 MED ORDER — PREDNISONE 10 MG (21) PO TBPK: ORAL_TABLET | ORAL | 0 refills | Status: DC

## 2017-10-22 MED ORDER — HYDROCODONE-ACETAMINOPHEN 5-325 MG PO TABS
2.0000 | ORAL_TABLET | Freq: Once | ORAL | Status: AC
  Administered 2017-10-22: 2 via ORAL
  Filled 2017-10-22: qty 2

## 2017-10-22 NOTE — ED Notes (Signed)
No response when called to room.  

## 2017-10-22 NOTE — ED Notes (Signed)
Pt is requesting something for pain now, delay in discharge

## 2017-10-22 NOTE — Discharge Instructions (Signed)
°  Antiinflammatory medications: Take 600 mg of ibuprofen every 6 hours or 440 mg (over the counter dose) to 500 mg (prescription dose) of naproxen every 12 hours for the next 3 days. After this time, these medications may be used as needed for pain. Take these medications with food to avoid upset stomach. Choose only one of these medications, do not take them together. Tylenol: Should you continue to have additional pain while taking the ibuprofen or naproxen, you may add in tylenol as needed. Your daily total maximum amount of tylenol from all sources should be limited to 4000mg /day for persons without liver problems, or 2000mg /day for those with liver problems. Vicodin: May take Vicodin as needed for severe pain.  Do not drive or perform other dangerous activities while taking the Vicodin.  Please note that each pill of Vicodin contains 325 mg of Tylenol and the above dosage limits apply.  Prednisone: Take this medication, as prescribed, until finished.  Follow-up: Follow-up with a neurologist as soon as possible on this matter.  Return: Return to the ED should you experience weakness on one side of the body or the other, difficulty swallowing, difficulty breathing, vision loss, or any other major concerns.

## 2017-10-22 NOTE — ED Triage Notes (Signed)
She c/o right TMJ area pain. She is in no distress.

## 2017-10-22 NOTE — ED Notes (Signed)
Pt reports no reaction to hydrocodone in the past, only morphine drip.

## 2017-10-22 NOTE — ED Provider Notes (Signed)
[facility] Provider Note   CSN: [id number] Arrival date & time: 10/22/17  1346     History   Chief Complaint Chief Complaint  Patient presents with  . Jaw Pain    HPI [name] [name] is a 62 y.o. female.  HPI   [name] [name] is a 62 y.o. female, with a history of headaches, presenting to the ED with right-sided facial pain for the last 2-3 days. Pain is sharp, shooting, intermittent, severe, and accompanied by eyelid twitching.  She has tried a single dose of ibuprofen without resolution.  Pain comes on when she touches the corner of her mouth or smiles.  Symptoms are not brought on by exertion. Denies fever/chills, nausea/vomiting, headache, vision change or loss, swallow dysfunction, shortness of breath, diaphoresis, chest pain, dizziness, drooling, trauma, ear pain or drainage, or any other complaints.  Past Medical History:  Diagnosis Date  . Acid reflux   . Arthritis    back and neck  . H/O hiatal hernia   . Headache(784.0)    migraines  . Hypercholesteremia   . Thyroid disease   . Thyroid nodule     Patient Active Problem List   Diagnosis Date Noted  . Obstructive sleep apnea on CPAP 02/23/2017  . 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 Snoring 03/25/2015  . 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 Acute pancreatitis 10/26/2011  . Nausea and vomiting in adult 10/26/2011  . Dehydration 10/26/2011  . Gastroenteritis 10/26/2011  . Hypokalemia 10/26/2011  . Elevated liver function tests 10/26/2011  . Acid reflux   . Thyroid disease     Past Surgical History:  Procedure Laterality Date  . ABDOMINAL HYSTERECTOMY    . CHOLECYSTECTOMY    . ECTOPIC PREGNANCY SURGERY    . EUS  12/21/2011   Procedure: ESOPHAGEAL ENDOSCOPIC ULTRASOUND (EUS) RADIAL;  Surgeon: Willis Modena, MD;  Location: [facility];  Service: Endoscopy;  Laterality: N/A;  . EYE SURGERY     lasik    OB History      No data available       Home Medications    Prior to Admission medications   Medication Sig Start Date End Date Taking? Authorizing Provider  b complex vitamins tablet Take 1 tablet by mouth daily.    [provider]  calcium carbonate (OSCAL) 1500 (600 Ca) MG TABS tablet Take by mouth daily with breakfast. One daily    [provider]  Cholecalciferol (VITAMIN D3) 50000 units TABS Take by mouth.    [provider]  conjugated estrogens (PREMARIN) vaginal cream Place 1 Applicatorful vaginally daily.    [provider]  fluticasone (FLONASE) 50 MCG/ACT nasal spray Place 2 sprays into both nostrils daily. 03/25/17   Sharlene Dory, DO  HYDROcodone-acetaminophen (NORCO/VICODIN) 5-325 MG tablet Take 1-2 tablets by mouth every 4 (four) hours as needed for severe pain. 10/22/17   Mashell Sieben C, PA-C  ibuprofen (ADVIL,MOTRIN) 200 MG tablet Take 400 mg by mouth every 6 (six) hours as needed for moderate pain.    [provider]  Omega-3 Fatty Acids (FISH OIL) 1000 MG CAPS Take by mouth. One daily    [provider]  predniSONE (STERAPRED UNI-PAK 21 TAB) 10 MG (21) TBPK tablet Take 6 tabs by mouth daily  for 2 days, then 5 tabs for 2 days, then 4 tabs for 2 days, then 3 tabs for 2 days, 2 tabs for 2 days, then 1  tab by mouth daily for 2 days 10/22/17   Anselm PancoastJoy, Adriel Desrosier C, PA-C    Family History Family History  Problem Relation Age of Onset  . Colon cancer Mother   . Pancreatic cancer Brother     Social History Social History   Tobacco Use  . Smoking status: Never Smoker  . Smokeless tobacco: Never Used  Substance Use Topics  . Alcohol use: Yes    Comment: occasionally  . Drug use: No     Allergies   Morphine and related and Sulfa antibiotics   Review of Systems Review of Systems  Constitutional: Negative for fever.  HENT: Negative for drooling, facial swelling, hearing loss, sore throat, trouble swallowing and voice change.         Right-sided facial pain  Eyes: Negative for photophobia, pain and visual disturbance.  Respiratory: Negative for cough and shortness of breath.   Cardiovascular: Negative for chest pain.  Gastrointestinal: Negative for nausea and vomiting.  Musculoskeletal: Negative for neck pain.  Skin: Negative for pallor.  Neurological: Negative for dizziness, syncope, weakness, light-headedness, numbness and headaches.  All other systems reviewed and are negative.    Physical Exam Updated Vital Signs BP 128/71 (BP Location: Left Arm)   Pulse 71   Temp 98.4 F (36.9 C) (Oral)   Resp 18   Ht 5\' 6"  (1.676 m)   Wt 89.6 kg (197 lb 8 oz)   SpO2 98%   BMI 31.88 kg/m   Physical Exam  Constitutional: She appears well-developed and well-nourished. No distress.  HENT:  Head: Normocephalic and atraumatic.  Mouth/Throat: Oropharynx is clear and moist.  No noted tenderness, erythema, edema, or other abnormality noted to the patient's face or head. Patient handles oral secretions without difficulty.  No trismus. No noted tenderness, surrounding swelling, instability, or fractures to patient's dentition.  Eyes: Conjunctivae and EOM are normal. Pupils are equal, round, and reactive to light.  Neck: Normal range of motion. Neck supple.  Cardiovascular: Normal rate, regular rhythm, normal heart sounds and intact distal pulses.  Pulmonary/Chest: Effort normal and breath sounds normal. No respiratory distress.  Patient shows no increased work of breathing.  Speaks in full sentences without difficulty.  Abdominal: Soft. There is no tenderness. There is no guarding.  Musculoskeletal: She exhibits no edema.  Lymphadenopathy:    She has no cervical adenopathy.  Neurological: She is alert.  No sensory deficits.  No noted speech deficits. No aphasia. Patient handles oral secretions without difficulty. No noted swallowing defects.  Equal grip strength bilaterally. Strength 5/5 in the upper  extremities. Strength 5/5 with flexion and extension of the hips, knees, and ankles bilaterally.  Negative Romberg. No gait disturbance.  Coordination intact including heel to shin and finger to nose.  Cranial nerves III-XII grossly intact.  Right side of the face does not rise as much as the left when the patient smiles. She states, "When I smile with that side of my mouth, it starts an attack again so I'm trying not to do it." No eyelid ptosis.   Skin: Skin is warm and dry. She is not diaphoretic.  Psychiatric: She has a normal mood and affect. Her behavior is normal.  Nursing note and vitals reviewed.    ED Treatments / Results  Labs (all labs ordered are listed, but only abnormal results are displayed) Labs Reviewed - No data to display  EKG  EKG Interpretation None       Radiology No results found.  Procedures Procedures (including critical  care time)  Medications Ordered in ED Medications  HYDROcodone-acetaminophen (NORCO/VICODIN) 5-325 MG per tablet 2 tablet (2 tablets Oral Given 10/22/17 1946)     Initial Impression / Assessment and Plan / ED Course  I have reviewed the triage vital signs and the nursing notes.  Pertinent labs & imaging results that were available during my care of the patient were reviewed by me and considered in my medical decision making (see chart for details).     Patient presents with intermittent right sided facial pain.  May be due to trigeminal neuralgia.  Does not appear to have acute neurologic deficits.  My suspicion for CVA and ACS are very low. Neurology follow up. The patient was given instructions for home care as well as return precautions. Patient voices understanding of these instructions, accepts the plan, and is comfortable with discharge.   Findings and plan of care discussed with Gwyneth Sprout, MD.   Vitals:   10/22/17 1408 10/22/17 1945  BP: 128/71 (!) 147/88  Pulse: 71 66  Resp: 18 16  Temp: 98.4 F (36.9 C)  98.4 F (36.9 C)  TempSrc: Oral Oral  SpO2: 98% 99%  Weight: 89.6 kg (197 lb 8 oz)   Height: 5\' 6"  (1.676 m)      Final Clinical Impressions(s) / ED Diagnoses   Final diagnoses:  Right facial pain    ED Discharge Orders        Ordered    HYDROcodone-acetaminophen (NORCO/VICODIN) 5-325 MG tablet  Every 4 hours PRN     10/22/17 1920    predniSONE (STERAPRED UNI-PAK 21 TAB) 10 MG (21) TBPK tablet     10/22/17 1920       Anselm Pancoast, PA-C 10/22/17 2225    Gwyneth Sprout, MD 10/22/17 2357

## 2018-05-14 ENCOUNTER — Encounter: Payer: Self-pay | Admitting: *Deleted

## 2018-05-15 ENCOUNTER — Telehealth: Payer: Self-pay | Admitting: Neurology

## 2018-05-15 ENCOUNTER — Encounter: Payer: Self-pay | Admitting: Neurology

## 2018-05-15 ENCOUNTER — Ambulatory Visit (INDEPENDENT_AMBULATORY_CARE_PROVIDER_SITE_OTHER): Payer: Managed Care, Other (non HMO) | Admitting: Neurology

## 2018-05-15 ENCOUNTER — Encounter

## 2018-05-15 VITALS — BP 122/79 | HR 74 | Ht 66.0 in | Wt 194.0 lb

## 2018-05-15 DIAGNOSIS — R51 Headache: Secondary | ICD-10-CM

## 2018-05-15 DIAGNOSIS — R519 Headache, unspecified: Secondary | ICD-10-CM

## 2018-05-15 DIAGNOSIS — E538 Deficiency of other specified B group vitamins: Secondary | ICD-10-CM

## 2018-05-15 DIAGNOSIS — R2689 Other abnormalities of gait and mobility: Secondary | ICD-10-CM | POA: Diagnosis not present

## 2018-05-15 DIAGNOSIS — R270 Ataxia, unspecified: Secondary | ICD-10-CM | POA: Diagnosis not present

## 2018-05-15 DIAGNOSIS — M79672 Pain in left foot: Secondary | ICD-10-CM

## 2018-05-15 DIAGNOSIS — R413 Other amnesia: Secondary | ICD-10-CM

## 2018-05-15 DIAGNOSIS — M2550 Pain in unspecified joint: Secondary | ICD-10-CM

## 2018-05-15 DIAGNOSIS — E519 Thiamine deficiency, unspecified: Secondary | ICD-10-CM

## 2018-05-15 DIAGNOSIS — G3281 Cerebellar ataxia in diseases classified elsewhere: Secondary | ICD-10-CM

## 2018-05-15 DIAGNOSIS — R200 Anesthesia of skin: Secondary | ICD-10-CM

## 2018-05-15 DIAGNOSIS — G629 Polyneuropathy, unspecified: Secondary | ICD-10-CM

## 2018-05-15 DIAGNOSIS — M5481 Occipital neuralgia: Secondary | ICD-10-CM

## 2018-05-15 DIAGNOSIS — M79671 Pain in right foot: Secondary | ICD-10-CM

## 2018-05-15 DIAGNOSIS — M79673 Pain in unspecified foot: Secondary | ICD-10-CM | POA: Insufficient documentation

## 2018-05-15 DIAGNOSIS — R7309 Other abnormal glucose: Secondary | ICD-10-CM

## 2018-05-15 DIAGNOSIS — R0989 Other specified symptoms and signs involving the circulatory and respiratory systems: Secondary | ICD-10-CM

## 2018-05-15 NOTE — Progress Notes (Signed)
[facility]    Provider:  Dr Jaynee Eagles Referring Provider: Lanice Shirts, * Primary Care Physician:  Lanice Shirts, MD  CC: Off balance, numbness in the lower legs, minor tremors internally when lying down  HPI:  [name] [name] is a 62 y.o. female here as requested by Dr. Coralyn Mark for numbness which is a new reason for being seen here in the practice.  She has a past medical history epigastric pain, dyspepsia, diarrhea, bloating, spinal stenosis of the cervical region, hypercholesterolemia, obstructive sleep apnea (not using CPAP), and balance.  She was last seen in 2016.  Also past medical history of vitamin D deficiency, imbalance, chronic arthritis. She still feels off balance, lightheaded and it balances back out. No room spinning. She has numbness in the legs and weakness. It occurs every day, always when walking, lasts briefly, she does not. She has shooting pains in her head. She does not practice balance, has not been to PT. Always feels off balance, feels like she is going to fall. She has  A lot of phlegm when using her cpap, worse when using the cpap. She has pain in the right toe > left toe shooting pain  Initial HPI 02/2015: HPI:  [name] [name] is a 62 y.o. female here as a referral from Dr. Carlis Abbott for imbalance. PMHx of HTN, Diabetes, HLD, breast cancer, thyroid disease, migraines, arthritis.   She has been feeling imbalance like she is going to "drop". She has had some sharp pains shooting through the right (points to the v1 facial dermatome), weak and tired too. Symptoms started over a month ago. No medications changes. No inciting factors. Dizziness, lightheadedness happens when she is walking, when she stands and starts to move,  She also gets body jerks. Getting worse. She gets the jerks every day, twice daily. It is her body, she is walking, the legs get weak and she slows down and tries to grab the wall. No CP. She has SOB on exertion. Off and  on, she gets feelings like the heart is fluttering. The dizziness and the jerks occur at the same time. The jerks happen when she is off balance. She is continuously off balance with light headedness. She feels like she is in a tunnel and "gonna pass out" with heart fluttering. No falls.  When she is laughing very hard she feels like she is going to pass out, like she gets very weak and is going to fall, los sof tone in the body and she physically has to hold onto something or sit down or she will fall. She endorses sleep paralysis episodes but not in the last month. She is excessively tired after lunch, she always thought it was post lunch tiredness, because of eating food but she gets so tired she can't keep her eyes open. She snores "terribly". She has morning headaches and nocturnal headaches. Her husband tries to wake her and shake her to stop her from snoring.  Reviewed notes, labs and imaging from outside physicians, which showed:   CT if the head showed No acute intracranial abnormalities including mass lesion or mass effect, hydrocephalus, extra-axial fluid collection, midline shift, hemorrhage, or acute infarction, large ischemic events (personally reviewed images).  MRI of the cervical cord: degenerative spondylosis of c3-c4 with mild foraminal narrowing bilaterally and also at c5-c6 with possible bilateral c6 nerve root encroachment.   Cbc,bmp,tsh unremarkable except for elevated ca  Reviewed notes, labs and imaging from outside physicians, which showed:  Labs  reviewed BUN 11 cret .74 02/2018.   Reviewed referring 58 notes Dr. show one half.  She has not had primary care in a while.  She has chronic osteoarthritis she is been on prednisone and diclofenac, spinal stenosis cervical managed by Dr. Sherwood Gambler, right trigeminal neuralgia managed by Dr. Lavell Anchors.  She reported feeling off balance, dating back to 2010, she does not describe spinning sensation but "I need to hang onto the  wall I think I will pass out "she tells me she has seen Dr. Einar Gip who told her her heart is fine, she has had evaluation with Dr. Minna Merritts 02/23/2015 felt to be presyncope versus seizure, EEG mild slowing left temporal area but no epileptiform discharges and MRI of the brain normal.  Review of Systems: Patient complains of symptoms per HPI as well as the following symptoms:  . Pertinent negatives and positives per HPI. All others negative.   Social History   Socioeconomic History  . Marital status: Married    Spouse name: Not on file  . Number of children: Not on file  . Years of education: Not on file  . Highest education level: Not on file  Occupational History  . Not on file  Social Needs  . Financial resource strain: Not on file  . Food insecurity:    Worry: Not on file    Inability: Not on file  . Transportation needs:    Medical: Not on file    Non-medical: Not on file  Tobacco Use  . Smoking status: Never Smoker  . Smokeless tobacco: Never Used  Substance and Sexual Activity  . Alcohol use: Yes    Comment: occasionally  . Drug use: No  . Sexual activity: Yes    Birth control/protection: None  Lifestyle  . Physical activity:    Days per week: Not on file    Minutes per session: Not on file  . Stress: Not on file  Relationships  . Social connections:    Talks on phone: Not on file    Gets together: Not on file    Attends religious service: Not on file    Active member of club or organization: Not on file    Attends meetings of clubs or organizations: Not on file    Relationship status: Not on file  . Intimate partner violence:    Fear of current or ex partner: Not on file    Emotionally abused: Not on file    Physically abused: Not on file    Forced sexual activity: Not on file  Other Topics Concern  . Not on file  Social History Narrative   Married.   Drinks occasional sweet tea.   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    Family  History  Problem Relation Age of Onset  . Colon cancer Mother   . Pancreatic cancer Brother     Past Medical History:  Diagnosis Date  . Abnormal liver enzymes   . Acid reflux   . Arthritis    back and neck  . Bloating   . Bronchitis   . Chest pain, unspecified   . Diarrhea   . Dyspepsia   . Esophageal reflux   . H/O acute pancreatitis   . H/O hiatal hernia   . Headache(784.0)    migraines  . Hemorrhage of rectum and anus   . Hypercholesteremia   . Nausea   . Spinal stenosis, cervical region   . Thyroid disease   . Thyroid nodule  Past Surgical History:  Procedure Laterality Date  . ABDOMINAL HYSTERECTOMY  2007   total with BSO  . CHOLECYSTECTOMY    . ECTOPIC PREGNANCY SURGERY    . ESOPHAGOGASTRODUODENOSCOPY    . EUS  12/21/2011   Procedure: ESOPHAGEAL ENDOSCOPIC ULTRASOUND (EUS) RADIAL;  Surgeon: Arta Silence, MD;  Location: [facility];  Service: Endoscopy;  Laterality: N/A;  . eye cautery    . EYE SURGERY     lasik    No current outpatient medications on file.   No current facility-administered medications for this visit.     Allergies as of 05/15/2018 - Review Complete 05/15/2018  Allergen Reaction Noted  . Morphine and related Shortness Of Breath 12/20/2011  . Other  05/14/2018  . Sulfa antibiotics Hives 12/20/2011    Vitals: BP 122/79   Pulse 74   Ht 5' 6"  (1.676 m)   Wt 194 lb (88 kg)   BMI 31.31 kg/m  Last Weight:  Wt Readings from Last 1 Encounters:  05/15/18 194 lb (88 kg)   Last Height:   Ht Readings from Last 1 Encounters:  05/15/18 5' 6"  (1.676 m)   Physical exam: Exam: Gen: NAD, conversant, well nourised, obese, well groomed                     CV: RRR, no MRG. No Carotid Bruits. No peripheral edema, warm, nontender Eyes: Conjunctivae clear without exudates or hemorrhage  Neuro: Detailed Neurologic Exam  Speech:    Speech is normal; fluent and spontaneous with normal comprehension.  Cognition:    The patient is  oriented to person, place, and time;     recent and remote memory intact;     language fluent;     normal attention, concentration,     fund of knowledge Cranial Nerves:    The pupils are equal, round, and reactive to light. The fundi are normal and spontaneous venous pulsations are present. Visual fields are full to finger confrontation. Extraocular movements are intact. Trigeminal sensation is intact and the muscles of mastication are normal. The face is symmetric. The palate elevates in the midline. Hearing intact. Voice is normal. Shoulder shrug is normal. The tongue has normal motion without fasciculations.   Coordination:    Normal finger to nose and heel to shin. Normal rapid alternating movements.   Gait:    normal gait, imbalance on tandem, neg romberg  Motor Observation:    No asymmetry, no atrophy, and no involuntary movements noted. Tone:    Normal muscle tone.    Posture:    Posture is normal. normal erect    Strength:    Strength is V/V in the upper and lower limbs.      Sensation: decreased pin prick and temp distal in toes, intact vibration and proprioception     Reflex Exam:  DTR's:    Deep tendon reflexes in the upper and lower extremities are normal bilaterally.   Toes:    The toes are downgoing bilaterally.   Clonus:    Clonus is absent.       Assessment/Plan:  Patient here with ataxia   ENT: She has a lot of phlegm in the morning and this impairs cpap use need to figure this out or treat it, Will send to ENT first and then maybe pulmonary but doesn't appear to be coming from lungs. Dr. Ernesto Rutherford.  MRI brain due to concerning symptoms of morning headaches, positional headaches,ataxia to look for space occupying mass, chiari or  intracranial hypertension (pseudotumor), cerebellar or brain stem etiology, MS.   Physical Therapy: Left thoracic muscle needs stretching and dry needling for muscle tightening and pain. Also imbalance and risk of falls needs  balance training - she also has occipital neuralgia needs treatment for cervical myofascial pain left cervical spine.   Left occipital neuralgia: address with PT as well and MRI brain to ensure nothing else impinging that nerve.  Will make appt with sleep team to review cpap  She has pain in the right toe > left toe shooting pain: complete neuropathy evaluation  For pain in the bal of the foot needs poditry evaluation  Discoloration of nails in a stripe pattern: follow up with derm and podiatry, could be melanoma or something serious   Keep a daily calendar of when "imbalance" happens, what you were doing, were you standing, were you walking, were you getting up, had you just got up, how much fluid had you drank that day, what time was it.    Orders Placed This Encounter  Procedures  . MR BRAIN W WO CONTRAST  . B. burgdorfi Antibody  . Hemoglobin A1c  . Methylmalonic acid, serum  . Vitamin B1  . Angiotensin converting enzyme  . TSH  . HIV antibody  . Sedimentation rate  . ANA w/Reflex  . Sjogren's syndrome antibods(ssa + ssb)  . Pan-ANCA  . B12 and Folate Panel  . RPR  . Hepatitis C antibody  . Rheumatoid factor  . Heavy metals, blood  . Vitamin B6  . Multiple Myeloma Panel (SPEP&IFE w/QIG)  . Basic Metabolic Panel  . Ambulatory referral to Physical Therapy  . Ambulatory referral to ENT      Sarina Ill, MD  [facility] [address] [place], [place] [address]  Phone [phone] Fax [phone]

## 2018-05-15 NOTE — Patient Instructions (Addendum)
MRI brain w/wo contrast  Phlegm impairing CPAP use: ENT: Dr. Haroldine Laws  Physical Therapy: Left thoracic muscle needs stretching and dry needling for muscle tightening and pain. Also imbalance and risk of falls needs balance training - she also has occipital neuralgia needs treatment for cervical myofascial pain left cervical spine and treatment for occipital nerve pinching on the left  Left occipital neuralgia: address with PT as well and MRI brain to ensure nothing else impinging that nerve.  Will make appt with sleep team to review cpap, Robin Hyler will call you, bring CPAP machine if you come in  She has pain in the right toe > left toe shooting pain: complete neuropathy blood evaluation  For pain in the ball of the foot needs podiatry evaluation: call your insurance for names  Discoloration of nails in a stripe pattern: follow up with derm and podiatry, could be melanoma or something serious  Keep a daily calendar of when "imbalance" happens, what you were doing, were you standing, were you walking, were you getting up, had you just got up, how much fluid had you drank that day, what time was it.    Peripheral Neuropathy Peripheral neuropathy is a type of nerve damage. It affects nerves that carry signals between the spinal cord and other parts of the body. These are called peripheral nerves. With peripheral neuropathy, one nerve or a group of nerves may be damaged. What are the causes? Many things can damage peripheral nerves. For some people with peripheral neuropathy, the cause is unknown. Some causes include:  Diabetes. This is the most common cause of peripheral neuropathy.  Injury to a nerve.  Pressure or stress on a nerve that lasts a long time.  Too little vitamin B. Alcoholism can lead to this.  Infections.  Autoimmune diseases, such as multiple sclerosis and systemic lupus erythematosus.  Inherited nerve diseases.  Some medicines, such as cancer drugs.  Toxic  substances, such as lead and mercury.  Too little blood flowing to the legs.  Kidney disease.  Thyroid disease.  What are the signs or symptoms? Different people have different symptoms. The symptoms you have will depend on which of your nerves is damaged. Common symptoms include:  Loss of feeling (numbness) in the feet and hands.  Tingling in the feet and hands.  Pain that burns.  Very sensitive skin.  Weakness.  Not being able to move a part of the body (paralysis).  Muscle twitching.  Clumsiness or poor coordination.  Loss of balance.  Not being able to control your bladder.  Feeling dizzy.  Sexual problems.  How is this diagnosed? Peripheral neuropathy is a symptom, not a disease. Finding the cause of peripheral neuropathy can be hard. To figure that out, your health care provider will take a medical history and do a physical exam. A neurological exam will also be done. This involves checking things affected by your brain, spinal cord, and nerves (nervous system). For example, your health care provider will check your reflexes, how you move, and what you can feel. Other types of tests may also be ordered, such as:  Blood tests.  A test of the fluid in your spinal cord.  Imaging tests, such as CT scans or an MRI.  Electromyography (EMG). This test checks the nerves that control muscles.  Nerve conduction velocity tests. These tests check how fast messages pass through your nerves.  Nerve biopsy. A small piece of nerve is removed. It is then checked under a microscope.  How is this treated?  Medicine is often used to treat peripheral neuropathy. Medicines may include: ? Pain-relieving medicines. Prescription or over-the-counter medicine may be suggested. ? Antiseizure medicine. This may be used for pain. ? Antidepressants. These also may help ease pain from neuropathy. ? Lidocaine. This is a numbing medicine. You might wear a patch or be given a  shot. ? Mexiletine. This medicine is typically used to help control irregular heart rhythms.  Surgery. Surgery may be needed to relieve pressure on a nerve or to destroy a nerve that is causing pain.  Physical therapy to help movement.  Assistive devices to help movement. Follow these instructions at home:  Only take over-the-counter or prescription medicines as directed by your health care provider. Follow the instructions carefully for any given medicines. Do not take any other medicines without first getting approval from your health care provider.  If you have diabetes, work closely with your health care provider to keep your blood sugar under control.  If you have numbness in your feet: ? Check every day for signs of injury or infection. Watch for redness, warmth, and swelling. ? Wear padded socks and comfortable shoes. These help protect your feet.  Do not do things that put pressure on your damaged nerve.  Do not smoke. Smoking keeps blood from getting to damaged nerves.  Avoid or limit alcohol. Too much alcohol can cause a lack of B vitamins. These vitamins are needed for healthy nerves.  Develop a good support system. Coping with peripheral neuropathy can be stressful. Talk to a mental health specialist or join a support group if you are struggling.  Follow up with your health care provider as directed. Contact a health care provider if:  You have new signs or symptoms of peripheral neuropathy.  You are struggling emotionally from dealing with peripheral neuropathy.  You have a fever. Get help right away if:  You have an injury or infection that is not healing.  You feel very dizzy or begin vomiting.  You have chest pain.  You have trouble breathing. This information is not intended to replace advice given to you by your health care provider. Make sure you discuss any questions you have with your health care provider. Document Released: 08/12/2002 Document  Revised: 01/28/2016 Document Reviewed: 04/29/2013 Elsevier Interactive Patient Education  2017 [employer].

## 2018-05-15 NOTE — Telephone Encounter (Signed)
[employer] order sent to [facility]. They will obtain the auth and will reach out to the pt to schedule.

## 2018-05-25 NOTE — Telephone Encounter (Signed)
[place] [employer] auth: [id number]: [id number] (exp. 05/24/18 to 08/22/18) [facility] is scheduled at [facility] for 05/31/18.

## 2018-05-31 ENCOUNTER — Ambulatory Visit
Admission: RE | Admit: 2018-05-31 | Discharge: 2018-05-31 | Disposition: A | Discharge status: Home / Self Care | Payer: Managed Care, Other (non HMO) | Source: Ambulatory Visit | Attending: Neurology | Admitting: Neurology

## 2018-05-31 DIAGNOSIS — R2689 Other abnormalities of gait and mobility: Secondary | ICD-10-CM

## 2018-05-31 DIAGNOSIS — R270 Ataxia, unspecified: Secondary | ICD-10-CM

## 2018-05-31 DIAGNOSIS — G3281 Cerebellar ataxia in diseases classified elsewhere: Secondary | ICD-10-CM

## 2018-05-31 DIAGNOSIS — R413 Other amnesia: Secondary | ICD-10-CM

## 2018-05-31 DIAGNOSIS — M5481 Occipital neuralgia: Secondary | ICD-10-CM

## 2018-05-31 DIAGNOSIS — R519 Headache, unspecified: Secondary | ICD-10-CM

## 2018-05-31 DIAGNOSIS — R51 Headache: Secondary | ICD-10-CM

## 2018-05-31 MED ORDER — GADOBENATE DIMEGLUMINE 529 MG/ML IV SOLN
18.0000 mL | Freq: Once | INTRAVENOUS | Status: AC | PRN
  Administered 2018-05-31: 18 mL via INTRAVENOUS

## 2018-06-04 ENCOUNTER — Telehealth: Payer: Self-pay | Admitting: *Deleted

## 2018-06-04 NOTE — Telephone Encounter (Signed)
Spoke with patient and informed her that her MRI brain was a normal scan. She asked what was next. This RN reviewed Dr Trevor Mace plan per office note. The patient stated she has seen ENT and was told nothing was found. She called [facility] and got new supplies for her CPAP. She stated she feels it is working well now.  This RN advised the labs that  were ordered were never drawn. She asked what labs. This RN advised Dr Lucia Gaskins ordered labs to  evaluate possible neuropathy due to her c/o of toe pain. Advised her the orders are still active and no appointment necessary. Advised she not come between 12-1 pm lunch hour. She stated she would get labs done. She verbalized understanding, appreciation of call.

## 2018-06-04 NOTE — Telephone Encounter (Signed)
We discussed many next steps including the following that she needs to complete:  - Physical Therapy: Left thoracic muscle needs stretching and dry needling for muscle tightening and pain. Also imbalance and risk of falls needs balance training - she also has occipital neuralgia needs treatment for cervical myofascial pain left cervical spine.   - She has pain in the right toe > left toe shooting pain: complete neuropathy evaluation (thank you for telling her to come for lab work)  - For pain in the ball of the foot needs podiatry evaluation  -Discoloration of nails in a stripe pattern: follow up with derm and podiatry, could be melanoma or something serious   -Keep a daily calendar of when "imbalance" happens, what you were doing, were you standing, were you walking, were you getting up, had you just got up, how much fluid had you drank that day, what time was it.     When she completes the above plan including full physical therapy and imbalance diary of at least 6 weeks, if she still has symptoms we can schedule a follow up. Please let he know, this was discussed in detail at appointment. But she has to complete these as we agreed. thanks

## 2018-06-04 NOTE — Telephone Encounter (Signed)
Noted thank you

## 2018-06-05 NOTE — Telephone Encounter (Signed)
Spoke with patient and reviewed all of Dr Trevor Mace points outlined in her reply to this RN. Patient stated she never got a  call to schedule PT; this RN gave her number to neuro rehab and advised she call them to schedule PT. This RN advised she call a podiatrist and dermatologist to have issues with her feet/toes/nails checked out. Advised her after completing Dr Trevor Mace recommendations including PT and diary she can be seen for a follow up if she still has symptoms. She verbalized understanding of call.

## 2018-06-07 ENCOUNTER — Other Ambulatory Visit (INDEPENDENT_AMBULATORY_CARE_PROVIDER_SITE_OTHER): Payer: Self-pay

## 2018-06-07 DIAGNOSIS — Z0289 Encounter for other administrative examinations: Secondary | ICD-10-CM

## 2018-06-12 LAB — ANA W/REFLEX: Anti Nuclear Antibody(ANA): POSITIVE — AB

## 2018-06-12 LAB — B12 AND FOLATE PANEL
Folate: 15.8 ng/mL (ref >3.0)
Vitamin B-12: 605 pg/mL (ref 232–1245)

## 2018-06-12 LAB — BASIC METABOLIC PANEL
BUN/Creatinine Ratio: 14 (ref 12–28)
BUN: 12 mg/dL (ref 8–27)
CO2: 24 mmol/L (ref 20–29)
Calcium: 10.2 mg/dL (ref 8.7–10.3)
Chloride: 106 mmol/L (ref 96–106)
Creatinine, Ser: 0.86 mg/dL (ref 0.57–1.00)
GFR calc Af Amer: 85 mL/min/{1.73_m2} (ref >59)
GFR calc non Af Amer: 74 mL/min/{1.73_m2} (ref >59)
Glucose: 85 mg/dL (ref 65–99)
Potassium: 4.9 mmol/L (ref 3.5–5.2)
Sodium: 147 mmol/L — ABNORMAL HIGH (ref 134–144)

## 2018-06-12 LAB — HEMOGLOBIN A1C
Est. average glucose Bld gHb Est-mCnc: 114 mg/dL
Hgb A1c MFr Bld: 5.6 % (ref 4.8–5.6)

## 2018-06-12 LAB — MULTIPLE MYELOMA PANEL, SERUM
Albumin SerPl Elph-Mcnc: 4.1 g/dL (ref 2.9–4.4)
Albumin/Glob SerPl: 1.5 (ref 0.7–1.7)
Alpha 1: 0.2 g/dL (ref 0.0–0.4)
Alpha2 Glob SerPl Elph-Mcnc: 0.6 g/dL (ref 0.4–1.0)
B-Globulin SerPl Elph-Mcnc: 1.1 g/dL (ref 0.7–1.3)
Gamma Glob SerPl Elph-Mcnc: 1 g/dL (ref 0.4–1.8)
Globulin, Total: 2.9 g/dL (ref 2.2–3.9)
IgA/Immunoglobulin A, Serum: 170 mg/dL (ref 87–352)
IgG (Immunoglobin G), Serum: 1035 mg/dL (ref 700–1600)
IgM (Immunoglobulin M), Srm: 60 mg/dL (ref 26–217)
Total Protein: 7 g/dL (ref 6.0–8.5)

## 2018-06-12 LAB — HEAVY METALS, BLOOD
Arsenic: 4 ug/L (ref 2–23)
Lead, Blood: NOT DETECTED ug/dL (ref 0–4)
Mercury: 1 ug/L (ref 0.0–14.9)

## 2018-06-12 LAB — PAN-ANCA
ANCA Proteinase 3: 12.5 U/mL — ABNORMAL HIGH (ref 0.0–3.5)
Atypical pANCA: <1:20 {titer}
C-ANCA: <1:20 {titer}
Myeloperoxidase Ab: <9 U/mL (ref 0.0–9.0)
P-ANCA: <1:20 {titer}

## 2018-06-12 LAB — SJOGREN'S SYNDROME ANTIBODS(SSA + SSB)
ENA SSA (RO) Ab: >8 AI — ABNORMAL HIGH (ref 0.0–0.9)
ENA SSB (LA) Ab: <0.2 AI (ref 0.0–0.9)

## 2018-06-12 LAB — VITAMIN B6: Vitamin B6: 13.8 ug/L (ref 2.0–32.8)

## 2018-06-12 LAB — METHYLMALONIC ACID, SERUM: Methylmalonic Acid: 146 nmol/L (ref 0–378)

## 2018-06-12 LAB — ENA+DNA/DS+SJORGEN'S
ENA RNP Ab: <0.2 AI (ref 0.0–0.9)
ENA SM Ab Ser-aCnc: <0.2 AI (ref 0.0–0.9)
dsDNA Ab: <1 IU/mL (ref 0–9)

## 2018-06-12 LAB — RPR: RPR Ser Ql: NONREACTIVE

## 2018-06-12 LAB — HEPATITIS C ANTIBODY: Hep C Virus Ab: <0.1 s/co ratio (ref 0.0–0.9)

## 2018-06-12 LAB — VITAMIN B1: Thiamine: 145.4 nmol/L (ref 66.5–200.0)

## 2018-06-12 LAB — TSH: TSH: 0.956 u[IU]/mL (ref 0.450–4.500)

## 2018-06-12 LAB — B. BURGDORFI ANTIBODIES: Lyme IgG/IgM Ab: <0.91 {ISR} (ref 0.00–0.90)

## 2018-06-12 LAB — SEDIMENTATION RATE: Sed Rate: 2 mm/hr (ref 0–40)

## 2018-06-12 LAB — HIV ANTIBODY (ROUTINE TESTING W REFLEX): HIV Screen 4th Generation wRfx: NONREACTIVE

## 2018-06-12 LAB — ANGIOTENSIN CONVERTING ENZYME: Angio Convert Enzyme: 44 U/L (ref 14–82)

## 2018-06-12 LAB — RHEUMATOID FACTOR: Rheumatoid fact SerPl-aCnc: <10 IU/mL (ref 0.0–13.9)

## 2018-06-14 ENCOUNTER — Other Ambulatory Visit: Payer: Self-pay | Admitting: *Deleted

## 2018-06-14 ENCOUNTER — Telehealth: Payer: Self-pay | Admitting: *Deleted

## 2018-06-14 DIAGNOSIS — R768 Other specified abnormal immunological findings in serum: Secondary | ICD-10-CM

## 2018-06-14 NOTE — Telephone Encounter (Signed)
-----   Message from Anson Fret, MD sent at 06/11/2018  7:11 PM EDT ----- Labs were normal except several rheumatologic labs were positive: ANA, Sjogren's and p-anca. Unfortunately sometimes people who do not have Sjogren's disorder test positive for these antibodies. I would like to send [name] [name] to Rheumatology just to make sure. If she agrees please place rheum referral due to: neuropathy, +ana and +sjogren and +anca. Thanks

## 2018-06-14 NOTE — Telephone Encounter (Signed)
Called patient and discussed her lab results from Dr. Lucia Gaskins including that her labs were normal except she had several rheumatologic labs that were positive. She verbalized understanding of the suggested plan and she agreed to have the referral placed to rheumatology.   Referral placed per v.o. Dr. Lucia Gaskins.

## 2018-06-14 NOTE — Progress Notes (Signed)
Referral to Rheumatology

## 2018-06-25 ENCOUNTER — Ambulatory Visit (INDEPENDENT_AMBULATORY_CARE_PROVIDER_SITE_OTHER): Payer: Managed Care, Other (non HMO)

## 2018-06-25 ENCOUNTER — Ambulatory Visit (INDEPENDENT_AMBULATORY_CARE_PROVIDER_SITE_OTHER): Payer: Self-pay

## 2018-06-25 ENCOUNTER — Encounter: Payer: Self-pay | Admitting: Rheumatology

## 2018-06-25 ENCOUNTER — Encounter (INDEPENDENT_AMBULATORY_CARE_PROVIDER_SITE_OTHER): Payer: Self-pay

## 2018-06-25 ENCOUNTER — Ambulatory Visit (INDEPENDENT_AMBULATORY_CARE_PROVIDER_SITE_OTHER): Payer: Managed Care, Other (non HMO) | Admitting: Rheumatology

## 2018-06-25 VITALS — BP 122/79 | HR 64 | Resp 15 | Ht 66.5 in | Wt 196.6 lb

## 2018-06-25 DIAGNOSIS — R768 Other specified abnormal immunological findings in serum: Secondary | ICD-10-CM | POA: Diagnosis not present

## 2018-06-25 DIAGNOSIS — G8929 Other chronic pain: Secondary | ICD-10-CM

## 2018-06-25 DIAGNOSIS — Z8639 Personal history of other endocrine, nutritional and metabolic disease: Secondary | ICD-10-CM

## 2018-06-25 DIAGNOSIS — R5383 Other fatigue: Secondary | ICD-10-CM

## 2018-06-25 DIAGNOSIS — M25561 Pain in right knee: Secondary | ICD-10-CM

## 2018-06-25 DIAGNOSIS — M79642 Pain in left hand: Secondary | ICD-10-CM

## 2018-06-25 DIAGNOSIS — M25562 Pain in left knee: Secondary | ICD-10-CM | POA: Diagnosis not present

## 2018-06-25 DIAGNOSIS — M79641 Pain in right hand: Secondary | ICD-10-CM | POA: Diagnosis not present

## 2018-06-25 DIAGNOSIS — R2689 Other abnormalities of gait and mobility: Secondary | ICD-10-CM

## 2018-06-25 DIAGNOSIS — Z8669 Personal history of other diseases of the nervous system and sense organs: Secondary | ICD-10-CM

## 2018-06-25 NOTE — Patient Instructions (Signed)

## 2018-06-25 NOTE — Progress Notes (Signed)
Office Visit Note  Patient: [name] [name]             Date of Birth: [birth year]           MRN: [id number]             PCP: Lanice Shirts, MD Referring: Lanice Shirts, * Visit Date: 06/25/2018 Occupation: Chief Executive Officer   Subjective:  Joint pain and balance issues.   History of Present Illness: [name] [name] is a 63 y.o. female seen in consultation per request of her PCP.  According to patient her symptoms started about a year ago.  She states that she loses balance sometimes while she is walking it lasts only for a few seconds.   she usually holds onto the objects and feels better.  She has seen Dr. Lavell Anchors in the past and had extensive work-up which showed positive Ro antibody and low titer proteinase 3 antibody.  She has been also feeling some tightness in her knee joints but do not notice any swelling.  She denies any discomfort in her knee joints.  She has no difficulty climbing stairs or getting up from chair.  She has occasional discomfort in her ankles.  She has not noticed any swelling in her ankles.  There is no history of any rash.  When she saw her PCP she had some labs which were abnormal and she was referred to me.  She had Lasix in the past and she has had dry eyes since then.  She denies any history of dry mouth.  Patient reports she had occasional palpitations.  She has seen a cardiologist in the past and her EKG was normal.  Activities of Daily Living:  Patient reports morning stiffness for 5 minutes.   Patient Reports nocturnal pain.  Difficulty dressing/grooming: Denies Difficulty climbing stairs: Denies Difficulty getting out of chair: Denies Difficulty using hands for taps, buttons, cutlery, and/or writing: Denies  Review of Systems  Constitutional: Negative for fatigue.  HENT: Negative for mouth sores, trouble swallowing, trouble swallowing and mouth dryness.   Eyes: Positive for dryness. Negative for pain, redness and itching.    Respiratory: Negative for shortness of breath, wheezing and difficulty breathing.   Cardiovascular: Negative for chest pain, palpitations and swelling in legs/feet.  Gastrointestinal: Negative for abdominal pain, constipation, diarrhea, nausea and vomiting.  Endocrine: Negative for increased urination.  Genitourinary: Negative for painful urination, nocturia and pelvic pain.  Musculoskeletal: Positive for arthralgias, joint pain and morning stiffness. Negative for joint swelling.  Skin: Negative for rash and hair loss.  Allergic/Immunologic: Negative for susceptible to infections.  Neurological: Positive for memory loss. Negative for dizziness, light-headedness, headaches and weakness.  Hematological: Positive for bruising/bleeding tendency.  Psychiatric/Behavioral: Negative for confusion. The patient is not nervous/anxious.     PMFS History:  Patient Active Problem List   Diagnosis Date Noted  . Foot pain 05/15/2018  . Obstructive sleep apnea on CPAP 02/23/2017  . 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 Snoring 03/25/2015  . 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 Acute pancreatitis 10/26/2011  . Nausea and vomiting in adult 10/26/2011  . Dehydration 10/26/2011  . Gastroenteritis 10/26/2011  . Hypokalemia 10/26/2011  . Elevated liver function tests 10/26/2011  . Acid reflux   . Thyroid disease     Past Medical History:  Diagnosis Date  . Abnormal liver enzymes   . Acid reflux   . Arthritis    back and neck  .  Bloating   . Bronchitis   . Chest pain, unspecified   . Diarrhea   . Dyspepsia   . Esophageal reflux   . H/O acute pancreatitis   . H/O hiatal hernia   . Headache(784.0)    migraines  . Hemorrhage of rectum and anus   . Hypercholesteremia   . Nausea   . Spinal stenosis, cervical region   . Thyroid disease   . Thyroid nodule     Family History  Problem Relation Age of Onset  . Colon cancer Mother   . Pancreatic  cancer Brother   . Healthy Son   . Healthy Daughter    Past Surgical History:  Procedure Laterality Date  . ABDOMINAL HYSTERECTOMY  2007   total with BSO  . CHOLECYSTECTOMY    . COLONOSCOPY    . ECTOPIC PREGNANCY SURGERY    . ESOPHAGOGASTRODUODENOSCOPY    . EUS  12/21/2011   Procedure: ESOPHAGEAL ENDOSCOPIC ULTRASOUND (EUS) RADIAL;  Surgeon: Arta Silence, MD;  Location: [facility];  Service: Endoscopy;  Laterality: N/A;  . eye cautery    . EYE SURGERY     lasik  . EYE SURGERY Left    cataract extraction    Social History   Social History Narrative   Married.   Drinks occasional sweet tea.   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    Objective: Vital Signs: BP 122/79 (BP Location: Right Arm, Patient Position: Sitting, Cuff Size: Normal)   Pulse 64   Resp 15   Ht 5' 6.5" (1.689 m)   Wt 196 lb 9.6 oz (89.2 kg)   BMI 31.26 kg/m    Physical Exam  Constitutional: She is oriented to person, place, and time. She appears well-developed and well-nourished.  HENT:  Head: Normocephalic and atraumatic.  Eyes: Conjunctivae and EOM are normal.  Neck: Normal range of motion.  Cardiovascular: Normal rate, regular rhythm, normal heart sounds and intact distal pulses.  Pulmonary/Chest: Effort normal and breath sounds normal.  Abdominal: Soft. Bowel sounds are normal.  Lymphadenopathy:    She has no cervical adenopathy.  Neurological: She is alert and oriented to person, place, and time.  Skin: Skin is warm and dry. Capillary refill takes less than 2 seconds.  Psychiatric: She has a normal mood and affect. Her behavior is normal.  Nursing note and vitals reviewed.    Musculoskeletal Exam: C-spine thoracic lumbar spine good range of motion.  Shoulder joints elbow joints wrist joints were in good range of motion.  She has some tenderness across PIPs and DIPs but no synovitis was noted.  Hip joints were in good range of motion.  She had no warmth over her knee  joints but there was possible effusion in her right knee joint.  She has some DIP and PIP changes in her feet consistent with osteoarthritis.   CDAI Exam: CDAI Score: Not documented Patient Global Assessment: Not documented; Provider Global Assessment: Not documented Swollen: Not documented; Tender: Not documented Joint Exam   Not documented   There is currently no information documented on the homunculus. Go to the Rheumatology activity and complete the homunculus joint exam.  Investigation: No additional findings.  Imaging: Mr Jeri Cos TK Contrast  Result Date: 05/31/2018 [facility] NEURO[facility] REPORT STUDY DATE: 9/267/190 PATIENT NAME: [name] [name] DOB: 10/01/1955 MRN: [id number] ORDERING CLINICIAN: Sarina Ill, MD CLINICAL HISTORY: 62 year old female with memory loss. EXAM: MRI brain (with and without) TECHNIQUE: MRI of the brain with and  without contrast was obtained utilizing 5 mm axial slices with T1, T2, T2 flair, SWI and diffusion weighted views.  T1 sagittal, T2 coronal and postcontrast views in the axial and coronal plane were obtained. CONTRAST: 68m multihance COMPAR[facility]: 03[facility]18/15 [facility] [address]1.5 Tesla MRI)  FINDINGS: No abnormal lesions are seen on diffusion-weighted views to suggest acute ischemia. The cortical sulci, fissures and cisterns are normal in size and appearance. Lateral, third and fourth ventricle are normal in size and appearance. No extra-axial fluid collections are seen. No evidence of mass effect or midline shift.  No abnormal lesions on post-contrast views. On sagittal views the posterior fossa, pituitary gland and corpus callosum are unremarkable. No evidence of intracranial hemorrhage on SWI views. The orbits and their contents, paranasal sinuses and calvarium are unremarkable.  Intracranial flow voids are present.   Normal MRI brain (with and without). INTERPRETING PHYSICIAN: VPenni Bombard MD Certified in Neurology, Neurophysiology and Neuroimaging [facility] [address] [place] [phone]  Xr Hand 2 View Left  Result Date: 06/25/2018 Minimal PIP/DIP and CMC narrowing was noted.  No MCP intercarpal radiocarpal joint space narrowing was noted.  No erosive changes were noted.  No juxta articular osteopenia was noted. Impression: These findings are consistent with mild osteoarthritis of the hand.  Xr Hand 2 View Right  Result Date: 06/25/2018 Minimal PIP/DIP and CMC narrowing was noted.  No MCP intercarpal radiocarpal joint space narrowing was noted.  No erosive changes were noted.  No juxta articular osteopenia was noted. Impression: These findings are consistent with mild osteoarthritis of the hand.  Xr Knee 3 View Left  Result Date: 06/25/2018 Mild lateral compartment narrowing was noted.  Mild patellofemoral narrowing was noted.  No chondrocalcinosis was noted. Impression: These findings are consistent with mild osteoarthritis and mild chondromalacia patella.  Xr Knee 3 View Right  Result Date: 06/25/2018 Mild medial compartment narrowing was noted.  Mild patellofemoral narrowing was noted.  No chondrocalcinosis was noted. Impression: These findings are consistent with mild osteoarthritis and mild chondromalacia patella.   Recent Labs: Lab Results  Component Value Date   WBC 7.6 12/14/2015   HGB 13.2 12/14/2015   PLT 224 12/14/2015   NA 147 (H) 06/07/2018   K 4.9 06/07/2018   CL 106 06/07/2018   CO2 24 06/07/2018   GLUCOSE 85 06/07/2018   BUN 12 06/07/2018   CREATININE 0.86 06/07/2018   BILITOT 1.1 10/26/2011   ALKPHOS 84 10/26/2011   AST 46 (H) 10/26/2011   ALT 115 (H) 10/26/2011   PROT 7.0 06/07/2018   ALBUMIN 3.4 (L) 10/26/2011   CALCIUM 10.2 06/07/2018   GFRAA 85 06/07/2018  June 07, 2018 immunoglobulins normal, IFE negative, proteinase 3+, ANA no titer given, anti-Ro greater than 8.0,  rest of the ENA negative, ACE 44, TSH normal, ESR 2, hepatitis C negative, HIV negative  Speciality Comments: No specialty comments available.  Procedures:  No procedures performed Allergies: Morphine and related; Other; and Sulfa antibiotics   Assessment / Plan:     Visit Diagnoses: Pain in both hands -patient complains of some pain and stiffness in her hands.  Clinical and radiographic findings are consistent with mild osteoarthritis.  No synovitis was noted.  Plan: XR Hand 2 View Right, XR Hand 2 View Left  Chronic pain of both knees -patient complains of fullness in her knees.  She appears to have mild effusion.  I offered aspiration but  she declined.  The x-rays obtained today showed mild osteoarthritis and mild chondromalacia patella.  I will give a handout on knee joint muscle strengthening exercises.  I will also obtain following labs today.  Plan: XR KNEE 3 VIEW RIGHT, XR KNEE 3 VIEW LEFT, Cyclic citrul peptide antibody, IgG, Uric acid  ANA positive - RF <10.0 -I will obtain following labs to explore this further.  Plan: C3 and C4, Beta-2 glycoprotein antibodies, Cardiolipin antibodies, IgG, IgM, IgA, Lupus Anticoagulant Eval w/Reflex, ANA  Antineutrophil cytoplasmic antibody (ANCA) positive - 06/07/2018 ANCA 12.5, she has no clinical features of vasculitis.  Her sed rate is normal.  She gives history of dry eyes which she related to Lasix surgery.  She uses over-the-counter eyedrops.  She denies any history of dry mouth.  Ro >8.0  Other fatigue - Plan: CBC with Differential/Platelet, Urinalysis, Routine w reflex microscopic, CK  Imbalance-patient had evaluation by Dr. Jaynee Eagles who obtained the autoimmune labs as noted above.  Patient states she had MRI of her brain which was normal.  She also had ENT evaluation which was unremarkable.  History of thyroid disease  History of sleep apnea  History of hypokalemia   Orders: Orders Placed This Encounter  Procedures  . XR Hand 2 View  Right  . XR Hand 2 View Left  . XR KNEE 3 VIEW RIGHT  . XR KNEE 3 VIEW LEFT  . CBC with Differential/Platelet  . Urinalysis, Routine w reflex microscopic  . CK  . C3 and C4  . Beta-2 glycoprotein antibodies  . Cardiolipin antibodies, IgG, IgM, IgA  . Lupus Anticoagulant Eval w/Reflex  . Cyclic citrul peptide antibody, IgG  . Uric acid  . ANA   No orders of the defined types were placed in this encounter.   Face-to-face time spent with patient was 50 minutes. Greater than 50% of time was spent in counseling and coordination of care.  Follow-Up Instructions: Return for Joint pain, positive Ro and positive ANCA.   Bo Merino, MD  Note - This record has been created using Editor, commissioning.  Chart creation errors have been sought, but may not always  have been located. Such creation errors do not reflect on  the standard of medical care.

## 2018-06-28 LAB — URIC ACID: Uric Acid, Serum: 5 mg/dL (ref 2.5–7.0)

## 2018-06-28 LAB — CBC WITH DIFFERENTIAL/PLATELET
Basophils Absolute: 18 cells/uL (ref 0–200)
Basophils Relative: 0.3 %
Eosinophils Absolute: 168 cells/uL (ref 15–500)
Eosinophils Relative: 2.8 %
HCT: 40.7 % (ref 35.0–45.0)
Hemoglobin: 14 g/dL (ref 11.7–15.5)
Lymphs Abs: 1866 cells/uL (ref 850–3900)
MCH: 28.2 pg (ref 27.0–33.0)
MCHC: 34.4 g/dL (ref 32.0–36.0)
MCV: 81.9 fL (ref 80.0–100.0)
MPV: 11 fL (ref 7.5–12.5)
Monocytes Relative: 6.5 %
Neutro Abs: 3558 cells/uL (ref 1500–7800)
Neutrophils Relative %: 59.3 %
Platelets: 252 10*3/uL (ref 140–400)
RBC: 4.97 10*6/uL (ref 3.80–5.10)
RDW: 13.2 % (ref 11.0–15.0)
Total Lymphocyte: 31.1 %
WBC mixed population: 390 cells/uL (ref 200–950)
WBC: 6 10*3/uL (ref 3.8–10.8)

## 2018-06-28 LAB — ANTI-NUCLEAR AB-TITER (ANA TITER): ANA Titer 1: 1:80 {titer} — ABNORMAL HIGH

## 2018-06-28 LAB — LUPUS ANTICOAGULANT EVAL W/ REFLEX
PTT-LA Screen: 33 s (ref <=40)
dRVVT: 42 s (ref <=45)

## 2018-06-28 LAB — CYCLIC CITRUL PEPTIDE ANTIBODY, IGG: Cyclic Citrullin Peptide Ab: <16 UNITS

## 2018-06-28 LAB — CK: Total CK: 62 U/L (ref 29–143)

## 2018-06-28 LAB — BETA-2 GLYCOPROTEIN ANTIBODIES
Beta-2 Glyco 1 IgA: <9 SAU (ref <=20)
Beta-2 Glyco 1 IgM: <9 SMU (ref <=20)
Beta-2 Glyco I IgG: <9 SGU (ref <=20)

## 2018-06-28 LAB — URINALYSIS, ROUTINE W REFLEX MICROSCOPIC
Bilirubin Urine: NEGATIVE
Glucose, UA: NEGATIVE
Hgb urine dipstick: NEGATIVE
Ketones, ur: NEGATIVE
Leukocytes, UA: NEGATIVE
Nitrite: NEGATIVE
Protein, ur: NEGATIVE
Specific Gravity, Urine: 1.006 (ref 1.001–1.03)
pH: 6.5 (ref 5.0–8.0)

## 2018-06-28 LAB — CARDIOLIPIN ANTIBODIES, IGG, IGM, IGA
Anticardiolipin IgA: <11 [APL'U]
Anticardiolipin IgG: <14 [GPL'U]
Anticardiolipin IgM: <12 [MPL'U]

## 2018-06-28 LAB — ANA: Anti Nuclear Antibody(ANA): POSITIVE — AB

## 2018-06-28 LAB — C3 AND C4
C3 Complement: 115 mg/dL (ref 83–193)
C4 Complement: 46 mg/dL (ref 15–57)

## 2018-07-10 ENCOUNTER — Ambulatory Visit: Payer: Managed Care, Other (non HMO) | Admitting: Rheumatology

## 2018-07-30 DIAGNOSIS — M19041 Primary osteoarthritis, right hand: Secondary | ICD-10-CM | POA: Insufficient documentation

## 2018-07-30 DIAGNOSIS — M170 Bilateral primary osteoarthritis of knee: Secondary | ICD-10-CM | POA: Insufficient documentation

## 2018-07-30 DIAGNOSIS — M19042 Primary osteoarthritis, left hand: Secondary | ICD-10-CM

## 2018-07-30 NOTE — Progress Notes (Deleted)
Office Visit Note  Patient: [name] [name]             Date of Birth: 1956/07/30           MRN: [id number]             PCP: Lanice Shirts, MD Referring: Lanice Shirts, * Visit Date: 08/13/2018 Occupation: @GUAROCC @  Subjective:  No chief complaint on file.   History of Present Illness: [name] [name] is a 62 y.o. female ***   Activities of Daily Living:  Patient reports morning stiffness for *** {minute/hour:19697}.   Patient {ACTIONS;DENIES/REPORTS:21021675::"Denies"} nocturnal pain.  Difficulty dressing/grooming: {ACTIONS;DENIES/REPORTS:21021675::"Denies"} Difficulty climbing stairs: {ACTIONS;DENIES/REPORTS:21021675::"Denies"} Difficulty getting out of chair: {ACTIONS;DENIES/REPORTS:21021675::"Denies"} Difficulty using hands for taps, buttons, cutlery, and/or writing: {ACTIONS;DENIES/REPORTS:21021675::"Denies"}  No Rheumatology ROS completed.   PMFS History:  Patient Active Problem List   Diagnosis Date Noted  . Foot pain 05/15/2018  . Obstructive sleep apnea on CPAP 02/23/2017  . 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 Snoring 03/25/2015  . 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 Acute pancreatitis 10/26/2011  . Nausea and vomiting in adult 10/26/2011  . Dehydration 10/26/2011  . Gastroenteritis 10/26/2011  . Hypokalemia 10/26/2011  . Elevated liver function tests 10/26/2011  . Acid reflux   . Thyroid disease     Past Medical History:  Diagnosis Date  . Abnormal liver enzymes   . Acid reflux   . Arthritis    back and neck  . Bloating   . Bronchitis   . Chest pain, unspecified   . Diarrhea   . Dyspepsia   . Esophageal reflux   . H/O acute pancreatitis   . H/O hiatal hernia   . Headache(784.0)    migraines  . Hemorrhage of rectum and anus   . Hypercholesteremia   . Nausea   . Spinal stenosis, cervical region   . Thyroid disease   . Thyroid nodule     Family History  Problem Relation  Age of Onset  . Colon cancer Mother   . Pancreatic cancer Brother   . Healthy Son   . Healthy Daughter    Past Surgical History:  Procedure Laterality Date  . ABDOMINAL HYSTERECTOMY  2007   total with BSO  . CHOLECYSTECTOMY    . COLONOSCOPY    . ECTOPIC PREGNANCY SURGERY    . ESOPHAGOGASTRODUODENOSCOPY    . EUS  12/21/2011   Procedure: ESOPHAGEAL ENDOSCOPIC ULTRASOUND (EUS) RADIAL;  Surgeon: Arta Silence, MD;  Location: [facility];  Service: Endoscopy;  Laterality: N/A;  . eye cautery    . EYE SURGERY     lasik  . EYE SURGERY Left    cataract extraction    Social History   Social History Narrative   Married.   Drinks occasional sweet tea.   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    Objective: Vital Signs: There were no vitals taken for this visit.   Physical Exam   Musculoskeletal Exam: ***  CDAI Exam: CDAI Score: Not documented Patient Global Assessment: Not documented; Provider Global Assessment: Not documented Swollen: Not documented; Tender: Not documented Joint Exam   Not documented   There is currently no information documented on the homunculus. Go to the Rheumatology activity and complete the homunculus joint exam.  Investigation: No additional findings.  Imaging: No results found.  Recent Labs: Lab Results  Component Value Date   WBC 6.0 06/25/2018   HGB 14.0 06/25/2018   PLT 252  06/25/2018   NA 147 (H) 06/07/2018   K 4.9 06/07/2018   CL 106 06/07/2018   CO2 24 06/07/2018   GLUCOSE 85 06/07/2018   BUN 12 06/07/2018   CREATININE 0.86 06/07/2018   BILITOT 1.1 10/26/2011   ALKPHOS 84 10/26/2011   AST 46 (H) 10/26/2011   ALT 115 (H) 10/26/2011   PROT 7.0 06/07/2018   ALBUMIN 3.4 (L) 10/26/2011   CALCIUM 10.2 06/07/2018   GFRAA 85 06/07/2018  UA neg, CK 62, uric acid 5.0, anti-CCP negative, ANA 1: 80 speckled, C3-C4 normal, anticardiolipin negative, beta-2 negative, lupus anticoagulant negative June 07, 2018  immunoglobulins normal, IFE negative, proteinase 3+, ANA no titer given, anti-Ro greater than 8.0, rest of the ENA negative, ACE 44, TSH normal, ESR 2, hepatitis C negative, HIV negative   Speciality Comments: No specialty comments available.  Procedures:  No procedures performed Allergies: Morphine and related; Other; and Sulfa antibiotics   Assessment / Plan:     Visit Diagnoses: No diagnosis found.   Orders: No orders of the defined types were placed in this encounter.  No orders of the defined types were placed in this encounter.   Face-to-face time spent with patient was *** minutes. Greater than 50% of time was spent in counseling and coordination of care.  Follow-Up Instructions: No follow-ups on file.   Bo Merino, MD  Note - This record has been created using Editor, commissioning.  Chart creation errors have been sought, but may not always  have been located. Such creation errors do not reflect on  the standard of medical care.

## 2018-08-13 ENCOUNTER — Ambulatory Visit: Payer: Managed Care, Other (non HMO) | Admitting: Physician Assistant

## 2018-10-10 ENCOUNTER — Other Ambulatory Visit: Payer: Self-pay | Admitting: Obstetrics and Gynecology

## 2018-10-10 DIAGNOSIS — Z1231 Encounter for screening mammogram for malignant neoplasm of breast: Secondary | ICD-10-CM

## 2018-11-05 ENCOUNTER — Ambulatory Visit
Admission: RE | Admit: 2018-11-05 | Discharge: 2018-11-05 | Disposition: A | Discharge status: Home / Self Care | Payer: Managed Care, Other (non HMO) | Source: Ambulatory Visit | Attending: Obstetrics and Gynecology | Admitting: Obstetrics and Gynecology

## 2018-11-05 DIAGNOSIS — Z1231 Encounter for screening mammogram for malignant neoplasm of breast: Secondary | ICD-10-CM

## 2019-04-18 ENCOUNTER — Telehealth: Payer: Self-pay | Admitting: Neurology

## 2019-04-18 NOTE — Telephone Encounter (Signed)
Pt is calling in requesting a call back to discuss CPAP cleaning system called prime clean.

## 2019-04-22 NOTE — Telephone Encounter (Signed)
Called the patient and advised her that it is a machine that Dr Brett Fairy would recommend to use that is compatible to the so clean machine. Advised that it has been advised that it cleans well. She would recommend these for patients that find themselves having frequent sinus infections. Patient was appreciative for the call back and verbalized understanding.

## 2019-07-04 ENCOUNTER — Ambulatory Visit: Payer: Self-pay

## 2019-07-04 ENCOUNTER — Encounter: Payer: Self-pay | Admitting: Surgery

## 2019-07-04 ENCOUNTER — Other Ambulatory Visit: Payer: Self-pay

## 2019-07-04 ENCOUNTER — Ambulatory Visit (INDEPENDENT_AMBULATORY_CARE_PROVIDER_SITE_OTHER): Payer: Managed Care, Other (non HMO) | Admitting: Surgery

## 2019-07-04 VITALS — Ht 66.0 in | Wt 194.0 lb

## 2019-07-04 DIAGNOSIS — M79661 Pain in right lower leg: Secondary | ICD-10-CM | POA: Diagnosis not present

## 2019-07-04 DIAGNOSIS — M25561 Pain in right knee: Secondary | ICD-10-CM

## 2019-07-04 MED ORDER — METHYLPREDNISOLONE ACETATE 40 MG/ML IJ SUSP
40.0000 mg | INTRAMUSCULAR | Status: AC | PRN
  Administered 2019-07-04: 40 mg via INTRA_ARTICULAR

## 2019-07-04 MED ORDER — LIDOCAINE HCL 1 % IJ SOLN
3.0000 mL | INTRAMUSCULAR | Status: AC | PRN
  Administered 2019-07-04: 3 mL

## 2019-07-04 MED ORDER — BUPIVACAINE HCL 0.25 % IJ SOLN
6.0000 mL | INTRAMUSCULAR | Status: AC | PRN
  Administered 2019-07-04: 6 mL via INTRA_ARTICULAR

## 2019-07-04 NOTE — Progress Notes (Signed)
Office Visit Note   Patient: [name] [name]           Date of Birth: [birth year]           MRN: [id number] Visit Date: 07/04/2019              Requested by: Lanice Shirts, MD [address] [place],  [place] [address] PCP: Lanice Shirts, MD   Assessment & Plan: Visit Diagnoses:  1. Acute pain of right knee   2. Pain in right lower leg     Plan: With patient's ongoing right knee pain I recommended trying an injection.  Patient consent right knee was prepped with Betadine and intra-articular Marcaine/Depo-Medrol injection performed.  Patient does have a history of positive ANA.  I recommend that she also follow-up with Dr.deveshwar for recheck.  She was last seen October 2019 but did not follow-up.  Follow-Up Instructions: Return in about 4 weeks (around 08/01/2019) for Dr. Lorin Mercy for recheck right knee.   Orders:  Orders Placed This Encounter  Procedures  . XR KNEE 3 VIEW RIGHT  . XR Tibia/Fibula Right   No orders of the defined types were placed in this encounter.     Procedures: Large Joint Inj: R knee on 07/04/2019 4:55 PM Indications: pain Details: 25 G 1.5 in needle, anteromedial approach Medications: 3 mL lidocaine 1 %; 40 mg methylPREDNISolone acetate 40 MG/ML; 6 mL bupivacaine 0.25 % Outcome: tolerated well, no immediate complications Consent was given by the patient.       Clinical Data: No additional findings.   Subjective: Chief Complaint  Patient presents with  . Right Knee - Pain  . Right Leg - Pain    HPI 63 year old black female comes in today with complaints of right knee pain and swelling. patient has seen rheumatologist last year and has a history of positive ANA.  She was supposed to have further work-up with Dr. Estanislado Pandy but did not return.  Has had increased knee pain over the last couple months.  No injury.  Feels like her knee gets stiff.  She has taken Advil and use Voltaren gel over the knee with minimal  improvement.   ROS no current cardiac pulmonary GI GU issues  Objective: Vital Signs: Ht 5\' 6"  (1.676 m)   Wt 194 lb (88 kg)   BMI 31.31 kg/m   Physical Exam HENT:     Head: Normocephalic.  Eyes:     Extraocular Movements: Extraocular movements intact.     Pupils: Pupils are equal, round, and reactive to light.  Pulmonary:     Effort: No respiratory distress.  Musculoskeletal:     Comments: Gait is somewhat antalgic.  Negative log about her hips.  Negative straight leg raise.  Right knee good range of motion.  Some swelling without large effusion.  Joint line tender.  Positive crepitus.  Some discomfort with McMurray's testing.  Neurological:     General: No focal deficit present.     Mental Status: She is alert and oriented to person, place, and time.  Psychiatric:        Mood and Affect: Mood normal.     Ortho Exam  Specialty Comments:  No specialty comments available.  Imaging: No results found.   PMFS History: Patient Active Problem List   Diagnosis Date Noted  . Primary osteoarthritis of both knees 07/30/2018  . Primary osteoarthritis of both hands 07/30/2018  . Foot pain 05/15/2018  . Obstructive sleep apnea on  CPAP 02/23/2017  . 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 Snoring 03/25/2015  . 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 Acute pancreatitis 10/26/2011  . Nausea and vomiting in adult 10/26/2011  . Dehydration 10/26/2011  . Gastroenteritis 10/26/2011  . Hypokalemia 10/26/2011  . Elevated liver function tests 10/26/2011  . Acid reflux   . Thyroid disease    Past Medical History:  Diagnosis Date  . Abnormal liver enzymes   . Acid reflux   . Arthritis    back and neck  . Bloating   . Bronchitis   . Chest pain, unspecified   . Diarrhea   . Dyspepsia   . Esophageal reflux   . H/O acute pancreatitis   . H/O hiatal hernia   . Headache(784.0)    migraines  . Hemorrhage of rectum and anus   .  Hypercholesteremia   . Nausea   . Spinal stenosis, cervical region   . Thyroid disease   . Thyroid nodule     Family History  Problem Relation Age of Onset  . Colon cancer Mother   . Pancreatic cancer Brother   . Healthy Son   . Healthy Daughter     Past Surgical History:  Procedure Laterality Date  . ABDOMINAL HYSTERECTOMY  2007   total with BSO  . CHOLECYSTECTOMY    . COLONOSCOPY    . ECTOPIC PREGNANCY SURGERY    . ESOPHAGOGASTRODUODENOSCOPY    . EUS  12/21/2011   Procedure: ESOPHAGEAL ENDOSCOPIC ULTRASOUND (EUS) RADIAL;  Surgeon: Willis [facility], MD;  Location: [facility];  Service: Endoscopy;  Laterality: N/A;  . eye cautery    . EYE SURGERY     lasik  . EYE SURGERY Left    cataract extraction    Social History   Occupational History  . Not on file  Tobacco Use  . Smoking status: Never Smoker  . Smokeless tobacco: Never Used  Substance and Sexual Activity  . Alcohol use: Yes    Comment: rarely   . Drug use: No  . Sexual activity: Yes    Birth control/protection: None

## 2019-07-05 ENCOUNTER — Telehealth: Payer: Self-pay | Admitting: Radiology

## 2019-07-05 NOTE — Telephone Encounter (Signed)
[name] [name] saw patient in clinic yesterday. He asked that I send you a message to get patient back in to see Dr.Deveshwar as soon as possible for a follow up.

## 2019-07-08 NOTE — Progress Notes (Deleted)
Office Visit Note  Patient: [name] [name]             Date of Birth: [birth year]           MRN: [id number]             PCP: Kendrick Ranch, MD Referring: Kendrick Ranch, * Visit Date: 07/19/2019 Occupation: @GUAROCC @  Subjective:  No chief complaint on file.   History of Present Illness: [name] [name] is a 63 y.o. female ***   Activities of Daily Living:  Patient reports morning stiffness for *** {minute/hour:19697}.   Patient {ACTIONS;DENIES/REPORTS:21021675::"Denies"} nocturnal pain.  Difficulty dressing/grooming: {ACTIONS;DENIES/REPORTS:21021675::"Denies"} Difficulty climbing stairs: {ACTIONS;DENIES/REPORTS:21021675::"Denies"} Difficulty getting out of chair: {ACTIONS;DENIES/REPORTS:21021675::"Denies"} Difficulty using hands for taps, buttons, cutlery, and/or writing: {ACTIONS;DENIES/REPORTS:21021675::"Denies"}  No Rheumatology ROS completed.   PMFS History:  Patient Active Problem List   Diagnosis Date Noted  . Primary osteoarthritis of both knees 07/30/2018  . Primary osteoarthritis of both hands 07/30/2018  . Foot pain 05/15/2018  . Obstructive sleep apnea on CPAP 02/23/2017  . 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 Snoring 03/25/2015  . 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 Acute pancreatitis 10/26/2011  . Nausea and vomiting in adult 10/26/2011  . Dehydration 10/26/2011  . Gastroenteritis 10/26/2011  . Hypokalemia 10/26/2011  . Elevated liver function tests 10/26/2011  . Acid reflux   . Thyroid disease     Past Medical History:  Diagnosis Date  . Abnormal liver enzymes   . Acid reflux   . Arthritis    back and neck  . Bloating   . Bronchitis   . Chest pain, unspecified   . Diarrhea   . Dyspepsia   . Esophageal reflux   . H/O acute pancreatitis   . H/O hiatal hernia   . Headache(784.0)    migraines  . Hemorrhage of rectum and anus   . Hypercholesteremia   . Nausea   . Spinal  stenosis, cervical region   . Thyroid disease   . Thyroid nodule     Family History  Problem Relation Age of Onset  . Colon cancer Mother   . Pancreatic cancer Brother   . Healthy Son   . Healthy Daughter    Past Surgical History:  Procedure Laterality Date  . ABDOMINAL HYSTERECTOMY  2007   total with BSO  . CHOLECYSTECTOMY    . COLONOSCOPY    . ECTOPIC PREGNANCY SURGERY    . ESOPHAGOGASTRODUODENOSCOPY    . EUS  12/21/2011   Procedure: ESOPHAGEAL ENDOSCOPIC ULTRASOUND (EUS) RADIAL;  Surgeon: 12/23/2011, MD;  Location: [facility];  Service: Endoscopy;  Laterality: N/A;  . eye cautery    . EYE SURGERY     lasik  . EYE SURGERY Left    cataract extraction    Social History   Social History Narrative   Married.   Drinks occasional sweet tea.   Works at [employer] as a [employer].    There is no immunization history on file for this patient.   Objective: Vital Signs: There were no vitals taken for this visit.   Physical Exam   Musculoskeletal Exam: ***  CDAI Exam: CDAI Score: - Patient Global: -; Provider Global: - Swollen: -; Tender: - Joint Exam   No joint exam has been documented for this visit   There is currently no information documented on the homunculus. Go to the Rheumatology activity and complete the homunculus joint exam.  Investigation: No additional findings.  Component     Latest Ref Rng & Units 06/25/2018  Lupus Anticoagulant      see note  PTT-LA Screen     <=40 sec 33  DRVVT     <=45 sec 42  PT, Mixing Interp      Not Indicated  Beta-2 Glycoprotein I Ab, IgG     < OR = 20 SGU <9  Beta-2 Glyco 1 IgM     < OR = 20 SMU <9  Beta-2 Glyco 1 IgA     < OR = 20 SAU <9  Anticardiolipin Ab,IgA,Qn     APL <11  Anticardiolipin Ab,IgG,Qn     GPL <14  Anticardiolipin Ab,IgM,Qn     MPL <12  C3 Complement     83 - 193 mg/dL 115  C4 Complement     15 - 57 mg/dL 46  ANA Titer 1     titer 1:80 (H)  ANA Pattern 1       Nuclear, Speckled (A)  CK Total     29 - 914 U/L 62  Cyclic Citrullin Peptide Ab     UNITS <16  Uric Acid, Serum     2.5 - 7.0 mg/dL 5.0  Anti Nuclear Antibody (ANA)     NEGATIVE POSITIVE (A)   Imaging: No results found.  Recent Labs: Lab Results  Component Value Date   WBC 6.0 06/25/2018   HGB 14.0 06/25/2018   PLT 252 06/25/2018   NA 147 (H) 06/07/2018   K 4.9 06/07/2018   CL 106 06/07/2018   CO2 24 06/07/2018   GLUCOSE 85 06/07/2018   BUN 12 06/07/2018   CREATININE 0.86 06/07/2018   BILITOT 1.1 10/26/2011   ALKPHOS 84 10/26/2011   AST 46 (H) 10/26/2011   ALT 115 (H) 10/26/2011   PROT 7.0 06/07/2018   ALBUMIN 3.4 (L) 10/26/2011   CALCIUM 10.2 06/07/2018   GFRAA 85 06/07/2018    Speciality Comments: No specialty comments available.  Procedures:  No procedures performed Allergies: Morphine and related, Other, and Sulfa antibiotics   Assessment / Plan:     Visit Diagnoses: No diagnosis found.  Orders: No orders of the defined types were placed in this encounter.  No orders of the defined types were placed in this encounter.   Face-to-face time spent with patient was *** minutes. Greater than 50% of time was spent in counseling and coordination of care.  Follow-Up Instructions: No follow-ups on file.   Ofilia Neas, PA-C  Note - This record has been created using Dragon software.  Chart creation errors have been sought, but may not always  have been located. Such creation errors do not reflect on  the standard of medical care.

## 2019-07-10 NOTE — Progress Notes (Signed)
Office Visit Note  Patient: [name] [name]             Date of Birth: [birth year]           MRN: [id number]             PCP: Kendrick Ranch, MD Referring: Kendrick Ranch, * Visit Date: 07/12/2019 Occupation: @  Subjective:  Arthritis (Low back pain, right knee pain, positive ANA)   History of Present Illness: [name] [name] is a 63 y.o. female with history of sicca symptoms and osteoarthritis.  She states she continues to have pain and swelling in her right knee joint.  She has been having increased discomfort.  She has pain in her bilateral ankle joints as well.  She gives history of dry eyes.  She denies any dry mouth symptoms.  Her skin is dry as well.  He describes generalized chills and staying cold.  She states in the colder weather her hands and feet turned colors.  Activities of Daily Living:  Patient reports morning stiffness for 5 minutes.   Patient Reports nocturnal pain.  Difficulty dressing/grooming: Denies Difficulty climbing stairs: Reports Difficulty getting out of chair: Reports Difficulty using hands for taps, buttons, cutlery, and/or writing: Denies  Review of Systems  Constitutional: Positive for fatigue. Negative for night sweats, weight gain and weight loss.  HENT: Negative for mouth sores, trouble swallowing, trouble swallowing, mouth dryness and nose dryness.   Eyes: Positive for dryness. Negative for pain, redness and visual disturbance.  Respiratory: Negative for cough, shortness of breath and difficulty breathing.   Cardiovascular: Negative for chest pain, palpitations, hypertension, irregular heartbeat and swelling in legs/feet.  Gastrointestinal: Negative for blood in stool, constipation and diarrhea.  Endocrine: Positive for cold intolerance. Negative for increased urination.  Genitourinary: Negative for difficulty urinating and vaginal dryness.  Musculoskeletal: Positive for arthralgias, gait problem, joint pain, joint swelling  and morning stiffness. Negative for myalgias, muscle weakness, muscle tenderness and myalgias.  Skin: Positive for color change. Negative for rash, hair loss, skin tightness, ulcers and sensitivity to sunlight.  Allergic/Immunologic: Negative for susceptible to infections.  Neurological: Positive for weakness. Negative for dizziness, memory loss and night sweats.  Hematological: Negative for bruising/bleeding tendency and swollen glands.  Psychiatric/Behavioral: Positive for sleep disturbance. Negative for depressed mood. The patient is not nervous/anxious.     PMFS History:  Patient Active Problem List   Diagnosis Date Noted  . Primary osteoarthritis of both knees 07/30/2018  . Primary osteoarthritis of both hands 07/30/2018  . Foot pain 05/15/2018  . Obstructive sleep apnea on CPAP 02/23/2017  . 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 Snoring 03/25/2015  . 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 Acute pancreatitis 10/26/2011  . Nausea and vomiting in adult 10/26/2011  . Dehydration 10/26/2011  . Gastroenteritis 10/26/2011  . Hypokalemia 10/26/2011  . Elevated liver function tests 10/26/2011  . Acid reflux   . Thyroid disease     Past Medical History:  Diagnosis Date  . Abnormal liver enzymes   . Acid reflux   . Arthritis    back and neck  . Bloating   . Bronchitis   . Chest pain, unspecified   . Diarrhea   . Dyspepsia   . Esophageal reflux   . H/O acute pancreatitis   . H/O hiatal hernia   . Headache(784.0)    migraines  . Hemorrhage of rectum and anus   . Hypercholesteremia   .  Nausea   . Spinal stenosis, cervical region   . Thyroid disease   . Thyroid nodule     Family History  Problem Relation Age of Onset  . Colon cancer Mother   . Pancreatic cancer Brother   . Healthy Son   . Healthy Daughter    Past Surgical History:  Procedure Laterality Date  . ABDOMINAL HYSTERECTOMY  2007   total with BSO  .  CHOLECYSTECTOMY    . COLONOSCOPY    . ECTOPIC PREGNANCY SURGERY    . ESOPHAGOGASTRODUODENOSCOPY    . EUS  12/21/2011   Procedure: ESOPHAGEAL ENDOSCOPIC ULTRASOUND (EUS) RADIAL;  Surgeon: Willis Modena, MD;  Location: [facility];  Service: Endoscopy;  Laterality: N/A;  . eye cautery    . EYE SURGERY     lasik  . EYE SURGERY Left    cataract extraction    Social History   Social History Narrative   Married.   Drinks occasional sweet tea.   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    There is no immunization history on file for this patient.   Objective: Vital Signs: BP 123/74 (BP Location: Left Arm, Patient Position: Sitting, Cuff Size: Normal)   Pulse 69   Resp 16   Ht 5\' 6"  (1.676 m)   Wt 192 lb 6.4 oz (87.3 kg)   BMI 31.05 kg/m    Physical Exam Vitals signs and nursing note reviewed.  Constitutional:      Appearance: She is well-developed.  HENT:     Head: Normocephalic and atraumatic.  Eyes:     Conjunctiva/sclera: Conjunctivae normal.  Neck:     Musculoskeletal: Normal range of motion.  Cardiovascular:     Rate and Rhythm: Normal rate and regular rhythm.     Heart sounds: Normal heart sounds.  Pulmonary:     Effort: Pulmonary effort is normal.     Breath sounds: Normal breath sounds.  Abdominal:     General: Bowel sounds are normal.     Palpations: Abdomen is soft.  Lymphadenopathy:     Cervical: No cervical adenopathy.  Skin:    General: Skin is warm and dry.     Capillary Refill: Capillary refill takes less than 2 seconds.  Neurological:     Mental Status: She is alert and oriented to person, place, and time.  Psychiatric:        Behavior: Behavior normal.      Musculoskeletal Exam: C-spine thoracic and lumbar spine were in good range of motion.  Shoulder joints elbow joints wrist joints with good range of motion.  She had no MCP PIP or DIP swelling.  Hip joints were in good range of motion.  She has warmth and swelling in her  right knee joint.  She has some tenderness over ankle joints.  CDAI Exam: CDAI Score: - Patient Global: -; Provider Global: - Swollen: -; Tender: - Joint Exam   No joint exam has been documented for this visit   There is currently no information documented on the homunculus. Go to the Rheumatology activity and complete the homunculus joint exam.  Investigation: No additional findings. Component     Latest Ref Rng & Units 06/25/2018  Lupus Anticoagulant      see note  PTT-LA Screen     <=40 sec 33  DRVVT     <=45 sec 42  PT, Mixing Interp      Not Indicated  Beta-2 Glycoprotein I Ab, IgG     <  OR = 20 SGU <9  Beta-2 Glyco 1 IgM     < OR = 20 SMU <9  Beta-2 Glyco 1 IgA     < OR = 20 SAU <9  Anticardiolipin Ab,IgA,Qn     APL <11  Anticardiolipin Ab,IgG,Qn     GPL <14  Anticardiolipin Ab,IgM,Qn     MPL <12  C3 Complement     83 - 193 mg/dL 115  C4 Complement     15 - 57 mg/dL 46  ANA Titer 1     titer 1:80 (H)  ANA Pattern 1      Nuclear, Speckled (A)  CK Total     29 - 782 U/L 62  Cyclic Citrullin Peptide Ab     UNITS <16  Uric Acid, Serum     2.5 - 7.0 mg/dL 5.0  Anti Nuclear Antibody (ANA)     NEGATIVE POSITIVE (A)   Imaging: No results found.  Recent Labs: Lab Results  Component Value Date   WBC 6.0 06/25/2018   HGB 14.0 06/25/2018   PLT 252 06/25/2018   NA 147 (H) 06/07/2018   K 4.9 06/07/2018   CL 106 06/07/2018   CO2 24 06/07/2018   GLUCOSE 85 06/07/2018   BUN 12 06/07/2018   CREATININE 0.86 06/07/2018   BILITOT 1.1 10/26/2011   ALKPHOS 84 10/26/2011   AST 46 (H) 10/26/2011   ALT 115 (H) 10/26/2011   PROT 7.0 06/07/2018   ALBUMIN 3.4 (L) 10/26/2011   CALCIUM 10.2 06/07/2018   GFRAA 85 06/07/2018    Speciality Comments: No specialty comments available.  Procedures:  No procedures performed Allergies: Morphine and related, Other, and Sulfa antibiotics   Assessment / Plan:     Visit Diagnoses: Sjogren's syndrome with other organ  involvement ([place]) - ANA 1: 80 speckled, positive Ro antibody, history of dry eyes and dry skin.  Patient had Lasik surgery and had increased dryness after that.  Detailed counseling Sjogren's was provided.  I discussed the option of Plaquenil use.  She was in agreement.  A consent was taken today.  The plan is to start her on Plaquenil 200 mg p.o. twice daily.  She will need baseline eye examination and then on yearly basis.  I have also advised her to establish with a PCP.  She will need a baseline EKG as positive Ro antibody could be associated with arrhythmias.  High risk medication-patient will be starting on Plaquenil.  She will need baseline CBC and CMP today.  I will also obtain G6PD.  Antineutrophil cytoplasmic antibody (ANCA) positive - Patient has no clinical features of vasculitis.  Sed rate was normal.  Primary osteoarthritis of both hands - Mild osteoarthritis was noted on the x-rays.  Primary osteoarthritis of both knees - Bilateral mild osteoarthritis and mild chondromalacia patella.  She has warmth and swelling in her right knee joint.  She has had recent cortisone injection by Dr. Lorin Mercy.  I would give her prednisone taper starting at 20 mg and then taper by 5 mg every 4 days.  Other fatigue  Imbalance - Followed by Dr. Jaynee Eagles.  Work-up so far has been negative.  She has been also followed by ENT.  History of thyroid disease  History of sleep apnea  History of hypokalemia  Orders: Orders Placed This Encounter  Procedures  . CBC with diff  . CMP with GFR  . G6PD  . Ambulatory referral to Cardiology   No orders of the defined types were placed  in this encounter.     Follow-Up Instructions: Return in about 4 weeks (around 08/09/2019) for Sjogren's, osteoarthritis.   Pollyann SavoyShaili Alfredia Desanctis, MD  Note - This record has been created using Animal nutritionistDragon software.  Chart creation errors have been sought, but may not always  have been located. Such creation errors do not reflect on   the standard of medical care.

## 2019-07-12 ENCOUNTER — Ambulatory Visit (INDEPENDENT_AMBULATORY_CARE_PROVIDER_SITE_OTHER): Payer: Managed Care, Other (non HMO) | Admitting: Rheumatology

## 2019-07-12 ENCOUNTER — Encounter: Payer: Self-pay | Admitting: Rheumatology

## 2019-07-12 ENCOUNTER — Other Ambulatory Visit: Payer: Self-pay

## 2019-07-12 ENCOUNTER — Other Ambulatory Visit: Payer: Self-pay | Admitting: *Deleted

## 2019-07-12 ENCOUNTER — Telehealth: Payer: Self-pay | Admitting: *Deleted

## 2019-07-12 VITALS — BP 123/74 | HR 69 | Resp 16 | Ht 66.0 in | Wt 192.4 lb

## 2019-07-12 DIAGNOSIS — Z79899 Other long term (current) drug therapy: Secondary | ICD-10-CM

## 2019-07-12 DIAGNOSIS — M170 Bilateral primary osteoarthritis of knee: Secondary | ICD-10-CM

## 2019-07-12 DIAGNOSIS — R2689 Other abnormalities of gait and mobility: Secondary | ICD-10-CM

## 2019-07-12 DIAGNOSIS — M3509 Sicca syndrome with other organ involvement: Secondary | ICD-10-CM

## 2019-07-12 DIAGNOSIS — R768 Other specified abnormal immunological findings in serum: Secondary | ICD-10-CM | POA: Diagnosis not present

## 2019-07-12 DIAGNOSIS — M19041 Primary osteoarthritis, right hand: Secondary | ICD-10-CM | POA: Diagnosis not present

## 2019-07-12 DIAGNOSIS — Z8639 Personal history of other endocrine, nutritional and metabolic disease: Secondary | ICD-10-CM

## 2019-07-12 DIAGNOSIS — Z8669 Personal history of other diseases of the nervous system and sense organs: Secondary | ICD-10-CM

## 2019-07-12 DIAGNOSIS — M19042 Primary osteoarthritis, left hand: Secondary | ICD-10-CM

## 2019-07-12 DIAGNOSIS — R5383 Other fatigue: Secondary | ICD-10-CM

## 2019-07-12 MED ORDER — PREDNISONE 5 MG PO TABS: ORAL_TABLET | ORAL | 0 refills | Status: DC

## 2019-07-12 NOTE — Telephone Encounter (Signed)
Please see note. Begin PLQ after normal labs. Thank you.

## 2019-07-12 NOTE — Patient Instructions (Addendum)
Standing Labs We placed an order today for your standing lab work.    Please come back and get your standing labs in 1 month and then every 3 months.   We have open lab daily Monday through Thursday from 8:30-12:30 PM and 1:30-4:30 PM and Friday from 8:30-12:30 PM and 1:30-4:00 PM at the office of Dr. Coreyon Nicotra.   You may experience shorter wait times on Monday and Friday afternoons. The office is located at [address], [address], [place],  [address] No appointment is necessary.   Labs are drawn by [facility].  You may receive a bill from [facility] for your lab work.  If you wish to have your labs drawn at another location, please call the office 24 hours in advance to send orders.  If you have any questions regarding directions or hours of operation,  please call [phone].   Just as a reminder please drink plenty of water prior to coming for your lab work. Thanks!     Hydroxychloroquine tablets What is this medicine? HYDROXYCHLOROQUINE (hye drox ee KLOR oh kwin) is used to treat rheumatoid arthritis and systemic lupus erythematosus. It is also used to treat malaria. This medicine may be used for other purposes; ask your health care provider or pharmacist if you have questions. COMMON BRAND NAME(S): Plaquenil, Quineprox What should I tell my health care provider before I take this medicine? They need to know if you have any of these conditions:  diabetes  eye disease, vision problems  G6PD deficiency  heart disease  history of irregular heartbeat  if you often drink alcohol  kidney disease  liver disease  porphyria  psoriasis  an unusual or allergic reaction to chloroquine, hydroxychloroquine, other medicines, foods, dyes, or preservatives  pregnant or trying to get pregnant  breast-feeding How should I use this medicine? Take this medicine by mouth with a glass of water. Follow the directions on the prescription label. Do not cut, crush or chew  this medicine. Swallow the tablets whole. Take this medicine with food. Avoid taking antacids within 4 hours of taking this medicine. It is best to separate these medicines by at least 4 hours. Take your medicine at regular intervals. Do not take it more often than directed. Take all of your medicine as directed even if you think you are better. Do not skip doses or stop your medicine early. Talk to your pediatrician regarding the use of this medicine in children. While this drug may be prescribed for selected conditions, precautions do apply. Overdosage: If you think you have taken too much of this medicine contact a poison control center or emergency room at once. NOTE: This medicine is only for you. Do not share this medicine with others. What if I miss a dose? If you miss a dose, take it as soon as you can. If it is almost time for your next dose, take only that dose. Do not take double or extra doses. What may interact with this medicine? Do not take this medicine with any of the following medications:  cisapride  dronedarone  pimozide  thioridazine This medicine may also interact with the following medications:  ampicillin  antacids  cimetidine  cyclosporine  digoxin  kaolin  medicines for diabetes, like insulin, glipizide, glyburide  medicines for seizures like carbamazepine, phenobarbital, phenytoin  mefloquine  methotrexate  other medicines that prolong the QT interval (cause an abnormal heart rhythm)  praziquantel This list may not describe all possible interactions. Give your health care   provider a list of all the medicines, herbs, non-prescription drugs, or dietary supplements you use. Also tell them if you smoke, drink alcohol, or use illegal drugs. Some items may interact with your medicine. What should I watch for while using this medicine? Visit your health care professional for regular checks on your progress. Tell your health care professional if your  symptoms do not start to get better or if they get worse. You may need blood work done while you are taking this medicine. If you take other medicines that can affect heart rhythm, you may need more testing. Talk to your health care professional if you have questions. Your vision may be tested before and during use of this medicine. Tell your health care professional right away if you have any change in your eyesight. What side effects may I notice from receiving this medicine? Side effects that you should report to your doctor or health care professional as soon as possible:  allergic reactions like skin rash, itching or hives, swelling of the face, lips, or tongue  changes in vision  decreased hearing or ringing of the ears  muscle weakness  redness, blistering, peeling or loosening of the skin, including inside the mouth  sensitivity to light  signs and symptoms of a dangerous change in heartbeat or heart rhythm like chest pain; dizziness; fast or irregular heartbeat; palpitations; feeling faint or lightheaded, falls; breathing problems  signs and symptoms of liver injury like dark yellow or brown urine; general ill feeling or flu-like symptoms; light-colored stools; loss of appetite; nausea; right upper belly pain; unusually weak or tired; yellowing of the eyes or skin  signs and symptoms of low blood sugar such as feeling anxious; confusion; dizziness; increased hunger; unusually weak or tired; sweating; shakiness; cold; irritable; headache; blurred vision; fast heartbeat; loss of consciousness  suicidal thoughts  uncontrollable head, mouth, neck, arm, or leg movements Side effects that usually do not require medical attention (report to your doctor or health care professional if they continue or are bothersome):  diarrhea  dizziness  hair loss  headache  irritable  loss of appetite  nausea, vomiting  stomach pain This list may not describe all possible side effects.  Call your doctor for medical advice about side effects. You may report side effects to FDA at 1-800-FDA-1088. Where should I keep my medicine? Keep out of the reach of children. Store at room temperature between 15 and 30 degrees C (59 and 86 degrees F). Protect from moisture and light. Throw away any unused medicine after the expiration date. NOTE: This sheet is a summary. It may not cover all possible information. If you have questions about this medicine, talk to your doctor, pharmacist, or health care provider.  2020 Elsevier/Gold Standard (2018-12-31 12:56:32)  

## 2019-07-15 ENCOUNTER — Telehealth: Payer: Self-pay | Admitting: Rheumatology

## 2019-07-15 DIAGNOSIS — R768 Other specified abnormal immunological findings in serum: Secondary | ICD-10-CM

## 2019-07-15 DIAGNOSIS — M3509 Sicca syndrome with other organ involvement: Secondary | ICD-10-CM

## 2019-07-15 NOTE — Telephone Encounter (Signed)
Patient advised we will repeat labs. Future order placed.

## 2019-07-15 NOTE — Telephone Encounter (Signed)
Ok to repeat lab work that was drawn in October 2019.

## 2019-07-15 NOTE — Telephone Encounter (Signed)
Patient called stating at her appointment last week Dr. Estanislado Pandy wanted her to have repeat labwork.  Patient states she would like to have "all her labwork that she had last year" since it has been a year and she is experiencing more symptoms.  Patient is requesting a return call to let her know that it is possible to have "all her labwork done and not just 1 or 2 tests."

## 2019-07-16 ENCOUNTER — Telehealth: Payer: Self-pay | Admitting: Rheumatology

## 2019-07-16 ENCOUNTER — Other Ambulatory Visit: Payer: Self-pay

## 2019-07-16 DIAGNOSIS — M3509 Sicca syndrome with other organ involvement: Secondary | ICD-10-CM

## 2019-07-16 DIAGNOSIS — R768 Other specified abnormal immunological findings in serum: Secondary | ICD-10-CM

## 2019-07-16 NOTE — Telephone Encounter (Signed)
Patient called requesting a return call regarding her prescription of Hydroxychlorquine.  Patient states she was able to pick up her prescription of Prednisone at [facility], but they didn't receive a prescription for the Hydroxychlorquine.

## 2019-07-16 NOTE — Telephone Encounter (Signed)
Patient advised we will send in prescription once her labs have results. Patient verbalized understanding. Patient had labs completed 07/16/19.

## 2019-07-18 ENCOUNTER — Other Ambulatory Visit: Payer: Self-pay

## 2019-07-18 ENCOUNTER — Encounter: Payer: Self-pay | Admitting: Cardiology

## 2019-07-18 ENCOUNTER — Ambulatory Visit (INDEPENDENT_AMBULATORY_CARE_PROVIDER_SITE_OTHER): Payer: Managed Care, Other (non HMO)

## 2019-07-18 ENCOUNTER — Ambulatory Visit (INDEPENDENT_AMBULATORY_CARE_PROVIDER_SITE_OTHER): Payer: Managed Care, Other (non HMO) | Admitting: Cardiology

## 2019-07-18 VITALS — BP 128/83 | HR 76 | Temp 98.1°F | Ht 66.0 in | Wt 194.0 lb

## 2019-07-18 DIAGNOSIS — R002 Palpitations: Secondary | ICD-10-CM | POA: Diagnosis not present

## 2019-07-18 DIAGNOSIS — M7989 Other specified soft tissue disorders: Secondary | ICD-10-CM | POA: Diagnosis not present

## 2019-07-18 DIAGNOSIS — R600 Localized edema: Secondary | ICD-10-CM

## 2019-07-18 DIAGNOSIS — M3500 Sicca syndrome, unspecified: Secondary | ICD-10-CM

## 2019-07-18 LAB — CBC WITH DIFFERENTIAL/PLATELET
Absolute Monocytes: 359 cells/uL (ref 200–950)
Basophils Absolute: 23 cells/uL (ref 0–200)
Basophils Relative: 0.4 %
Eosinophils Absolute: 171 cells/uL (ref 15–500)
Eosinophils Relative: 3 %
HCT: 41.2 % (ref 35.0–45.0)
Hemoglobin: 13.8 g/dL (ref 11.7–15.5)
Lymphs Abs: 1750 cells/uL (ref 850–3900)
MCH: 28.4 pg (ref 27.0–33.0)
MCHC: 33.5 g/dL (ref 32.0–36.0)
MCV: 84.8 fL (ref 80.0–100.0)
MPV: 10.4 fL (ref 7.5–12.5)
Monocytes Relative: 6.3 %
Neutro Abs: 3397 cells/uL (ref 1500–7800)
Neutrophils Relative %: 59.6 %
Platelets: 247 10*3/uL (ref 140–400)
RBC: 4.86 10*6/uL (ref 3.80–5.10)
RDW: 13.2 % (ref 11.0–15.0)
Total Lymphocyte: 30.7 %
WBC: 5.7 10*3/uL (ref 3.8–10.8)

## 2019-07-18 LAB — COMPLETE METABOLIC PANEL WITH GFR
AG Ratio: 2 (calc) (ref 1.0–2.5)
ALT: 13 U/L (ref 6–29)
AST: 13 U/L (ref 10–35)
Albumin: 4.9 g/dL (ref 3.6–5.1)
Alkaline phosphatase (APISO): 81 U/L (ref 37–153)
BUN: 18 mg/dL (ref 7–25)
CO2: 28 mmol/L (ref 20–32)
Calcium: 10 mg/dL (ref 8.6–10.4)
Chloride: 104 mmol/L (ref 98–110)
Creat: 0.74 mg/dL (ref 0.50–0.99)
GFR, Est African American: 101 mL/min/{1.73_m2} (ref >=60)
GFR, Est Non African American: 87 mL/min/{1.73_m2} (ref >=60)
Globulin: 2.4 g/dL (calc) (ref 1.9–3.7)
Glucose, Bld: 92 mg/dL (ref 65–99)
Potassium: 4 mmol/L (ref 3.5–5.3)
Sodium: 142 mmol/L (ref 135–146)
Total Bilirubin: 1.2 mg/dL (ref 0.2–1.2)
Total Protein: 7.3 g/dL (ref 6.1–8.1)

## 2019-07-18 LAB — URINALYSIS, ROUTINE W REFLEX MICROSCOPIC
Bilirubin Urine: NEGATIVE
Glucose, UA: NEGATIVE
Hgb urine dipstick: NEGATIVE
Ketones, ur: NEGATIVE
Leukocytes,Ua: NEGATIVE
Nitrite: NEGATIVE
Protein, ur: NEGATIVE
Specific Gravity, Urine: 1.004 (ref 1.001–1.03)
pH: 6.5 (ref 5.0–8.0)

## 2019-07-18 LAB — ANTI-NUCLEAR AB-TITER (ANA TITER): ANA Titer 1: 1:80 {titer} — ABNORMAL HIGH

## 2019-07-18 LAB — C3 AND C4
C3 Complement: 103 mg/dL (ref 83–193)
C4 Complement: 41 mg/dL (ref 15–57)

## 2019-07-18 LAB — LUPUS ANTICOAGULANT EVAL W/ REFLEX
PTT-LA Screen: 30 s (ref <=40)
dRVVT: 29 s (ref <=45)

## 2019-07-18 LAB — BETA-2 GLYCOPROTEIN ANTIBODIES
Beta-2 Glyco 1 IgA: <9 SAU (ref <=20)
Beta-2 Glyco 1 IgM: 9 SMU (ref <=20)
Beta-2 Glyco I IgG: <9 SGU (ref <=20)

## 2019-07-18 LAB — CYCLIC CITRUL PEPTIDE ANTIBODY, IGG: Cyclic Citrullin Peptide Ab: <16 UNITS

## 2019-07-18 LAB — URIC ACID: Uric Acid, Serum: 5.8 mg/dL (ref 2.5–7.0)

## 2019-07-18 LAB — CARDIOLIPIN ANTIBODIES, IGG, IGM, IGA
Anticardiolipin IgA: <11 [APL'U]
Anticardiolipin IgG: <14 [GPL'U]
Anticardiolipin IgM: <12 [MPL'U]

## 2019-07-18 LAB — ANA: Anti Nuclear Antibody (ANA): POSITIVE — AB

## 2019-07-18 LAB — CK: Total CK: 59 U/L (ref 29–143)

## 2019-07-18 NOTE — Progress Notes (Signed)
Primary Physician:  Lanice Shirts, MD   Patient ID: [name] [name], female    DOB: 13-Jun-1956, 63 y.o.   MRN: [id number]  Subjective:    Chief Complaint  Patient presents with  . Sjogren's syndrome  . Follow-up    HPI: [name] [name]  is a 63 y.o. female  with hyperlipidemia, sjorgens syndrome, Antineutrophil cytoplasmic antibody positive, history of sicca symptoms,OSA, and osteoarthritis referred to [place] by Dr. Estanislado Pandy given positive Ro antibody.  She was last seen by [place] in 2018.  She has history of chronic palpitations.  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  She continues to have occasional palpitations that have been stable.  She is essentially asymptomatic.  She denies any shortness of breath, chest pain, PND, orthopnea, dizziness or near syncope, symptoms of claudication or TIA.  She has chronic leg swelling in which she uses support stockings that helps with this.  Her only complaint is right leg joint swelling and pain.  She underwent treadmill stress testing in 2016 that was essentially normal.  Echocardiogram also performed at that time without any evidence of pulmonary hypertension.  Normal LVEF.  She currently does not have a PCP but is in the process of establishing with a new PCP as her previous had retired.  No history of tobacco use.    Past Medical History:  Diagnosis Date  . Abnormal liver enzymes   . Acid reflux   . Arthritis    back and neck  . Bloating   . Bronchitis   . Chest pain, unspecified   . Diarrhea   . Dyspepsia   . Esophageal reflux   . H/O acute pancreatitis   . H/O hiatal hernia   . Headache(784.0)    migraines  . Hemorrhage of rectum and anus   . Hypercholesteremia   . Nausea   . Spinal stenosis, cervical region   . Thyroid disease   . Thyroid nodule     Past Surgical History:  Procedure Laterality Date  . ABDOMINAL HYSTERECTOMY  2007   total with BSO  .  CHOLECYSTECTOMY    . COLONOSCOPY    . ECTOPIC PREGNANCY SURGERY    . ESOPHAGOGASTRODUODENOSCOPY    . EUS  12/21/2011   Procedure: ESOPHAGEAL ENDOSCOPIC ULTRASOUND (EUS) RADIAL;  Surgeon: Arta Silence, MD;  Location: [facility];  Service: Endoscopy;  Laterality: N/A;  . eye cautery    . EYE SURGERY     lasik  . EYE SURGERY Left    cataract extraction     Social History   Socioeconomic History  . Marital status: Married    Spouse name: Not on file  . Number of children: 2  . Years of education: Not on file  . Highest education level: Not on file  Occupational History  . Not on file  Social Needs  . Financial resource strain: Not on file  . Food insecurity    Worry: Not on file    Inability: Not on file  . Transportation needs    Medical: Not on file    Non-medical: Not on file  Tobacco Use  . Smoking status: Never Smoker  . Smokeless tobacco: Never Used  Substance and Sexual Activity  . Alcohol use: Yes    Comment: rarely   . Drug use: No  . Sexual activity: Yes    Birth control/protection: None  Lifestyle  . Physical activity    Days per week: Not on  file    Minutes per session: Not on file  . Stress: Not on file  Relationships  . Social Herbalist on phone: Not on file    Gets together: Not on file    Attends religious service: Not on file    Active member of club or organization: Not on file    Attends meetings of clubs or organizations: Not on file    Relationship status: Not on file  . Intimate partner violence    Fear of current or ex partner: Not on file    Emotionally abused: Not on file    Physically abused: Not on file    Forced sexual activity: Not on file  Other Topics Concern  . Not on file  Social History Narrative   Married.   Drinks occasional sweet tea.   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    Review of Systems  Constitution: Negative for decreased appetite, malaise/fatigue, weight gain and weight  loss.  Eyes: Negative for visual disturbance.  Cardiovascular: Positive for leg swelling and palpitations. Negative for chest pain, claudication, dyspnea on exertion, orthopnea and syncope.  Respiratory: Negative for hemoptysis and wheezing.   Endocrine: Negative for cold intolerance and heat intolerance.  Hematologic/Lymphatic: Does not bruise/bleed easily.  Skin: Negative for nail changes.  Musculoskeletal: Positive for joint pain. Negative for muscle weakness and myalgias.  Gastrointestinal: Negative for abdominal pain, change in bowel habit, nausea and vomiting.  Neurological: Negative for difficulty with concentration, dizziness, focal weakness and headaches.  Psychiatric/Behavioral: Negative for altered mental status and suicidal ideas.  All other systems reviewed and are negative.     Objective:  Blood pressure 128/83, pulse 76, temperature 98.1 F (36.7 C), height 5' 6" (1.676 m), weight 194 lb (88 kg), SpO2 97 %. Body mass index is 31.31 kg/m.    Physical Exam  Constitutional: She is oriented to person, place, and time. Vital signs are normal. She appears well-developed and well-nourished.  HENT:  Head: Normocephalic and atraumatic.  Neck: Normal range of motion.  Cardiovascular: Normal rate, regular rhythm, normal heart sounds and intact distal pulses.  Pulmonary/Chest: Effort normal and breath sounds normal. No accessory muscle usage. No respiratory distress.  Abdominal: Soft. Bowel sounds are normal.  Musculoskeletal: Normal range of motion.  Neurological: She is alert and oriented to person, place, and time.  Skin: Skin is warm and dry.  Vitals reviewed.  Radiology: No results found.  Laboratory examination:    CMP Latest Ref Rng & Units 07/16/2019 06/07/2018 12/14/2015  Glucose 65 - 99 mg/dL 92 85 97  BUN 7 - 25 mg/dL _0 Creatinine 0.50 - 0.99 mg/dL 0.74 0.86 0.80  Sodium 135 - 146 mmol/L 142 147(H) 141  Potassium 3.5 - 5.3 mmol/L 4.0 4.9 4.0  Chloride  98 - 110 mmol/L 104 106 108  CO2 20 - 32 mmol/L _1 Calcium 8.6 - 10.4 mg/dL 10.0 10.2 9.4  Total Protein 6.1 - 8.1 g/dL 7.3 7.0 -  Total Bilirubin 0.2 - 1.2 mg/dL 1.2 - -  Alkaline Phos 39 - 117 U/L - - -  AST 10 - 35 U/L 13 - -  ALT 6 - 29 U/L 13 - -   CBC Latest Ref Rng & Units 07/16/2019 06/25/2018 12/14/2015  WBC 3.8 - 10.8 Thousand/uL 5.7 6.0 7.6  Hemoglobin 11.7 - 15.5 g/dL 13.8 14.0 13.2  Hematocrit 35.0 - 45.0 % 41.2 40.7 38.4  Platelets 140 -  400 Thousand/uL 247 252 224   Lipid Panel     Component Value Date/Time   CHOL 144 10/25/2011 0330   TRIG 49 10/25/2011 0330   HDL 62 10/25/2011 0330   CHOLHDL 2.3 10/25/2011 0330   VLDL 10 10/25/2011 0330   LDLCALC 72 10/25/2011 0330   HEMOGLOBIN A1C Lab Results  Component Value Date   HGBA1C 5.6 06/07/2018   TSH No results for input(s): TSH in the last 8760 hours.  PRN Meds:. Medications Discontinued During This Encounter  Medication Reason  . Omega-3 Fatty Acids (FISH OIL) 1000 MG CPDR Error   Current Meds  Medication Sig  . Ascorbic Acid (VITAMIN C) 1000 MG tablet Take 1,000 mg by mouth daily.  . Multiple Vitamin (MULTIVITAMIN) capsule Take 1 capsule by mouth daily.  . Omega-3 Fatty Acids (FISH OIL) 600 MG CAPS Take 2 capsules by mouth daily.  . predniSONE (DELTASONE) 5 MG tablet Take 4 tabs po daily x 4 days, 3  tabs po daily x 4 days, 2  tabs po daily x 4 days, 1  tab po daily x 4 days  . [DISCONTINUED] Omega-3 Fatty Acids (FISH OIL) 1000 MG CPDR Take by mouth.    Cardiac Studies:   5 day ziopatch 03/24/2015: Patient had a min HR of 53 bpm, max HR of 148 bpm, and avg HR of 80 bpm. Predominant underlying rhythm was Sinus Rhythm. 2 Supraventricular Tachycardia runs occurred, the run with the fastest interval lasting 5 beats with a max rate of 148 bpm (avg 130 bpm); the run with the fastest interval was also the longest. Isolated SVEs were rare (<1.0%), SVE Couplets were rare (<1.0%), and no SVE Triplets  were present. Isolated VEs were rare (<1.0%), and no VE Couplets or VE Triplets were present. Inverted QRS complexes possibly due to inverted placement of device.  Echocardiogram 04/15/2015:  Left ventricle cavity is normal in size. Mild concentric hypertrophy of the left ventricle. Normal global wall motion. Normal diastolic filling pattern. Calculated EF 57%. Mild mitral regurgitation. Mild tricuspid regurgitation. No evidence of pulmonary hypertension. TR most probable source of systolic murmur.  Treadmill Exercise stress 04/10/2015: Indication: Syncope The patient exercised according to Bruce Protocol, Total time recorded  8:12 min achieving max heart rate of  140  which was  85 % of MPHR for age and  10.16 METS of work.  Normal BP response. Resting ECG NSR. No arrythmias noted. There was no ST-T changes of ischemia with exercise stress test. Stress terminated due to THR (85% MPHR)/MPHR met.  Normal exercise tolerence.  Assessment:   Palpitations - Plan: EKG 12-Lead  Sjogren's syndrome, with unspecified organ involvement (HCC)  Leg swelling  EKG 07/18/2019: Normal sinus rhythm at 73 bpm, normal axis, no evidence of ischemia.   Recommendations:   Patient referred to [place] for evaluation given positive R0 antibody by Dr. Estanislado Pandy.  She has a history of chronic palpitations and has previously worn event monitor revealing brief episodes of atrial tachycardia.  Her palpitations are unchanged and have remained stable.  Do not feel that she needs repeat monitoring at this time.  EKG is without any abnormalities, no evidence of AV block.  No symptoms of syncope or near syncope.  She will continue with follow-ups with Dr. Estanislado Pandy for management of Sjogrens.  I have stressed the importance of her contacting us for any dizziness, near syncope, syncope or dyspnea or worsening palpitations.  She has previously had echocardiogram without evidence of pulmonary hypertension.  She has  chronic leg  swelling likely related to venous insufficiency in which she uses support stockings.  She request a new prescription today that will be provided.  I have encouraged her to establish with a PCP.  Her blood pressure is stable.  We will see her back as needed, but encouraged her to contact us if needed.   *I have discussed this case with Dr. Einar Gip and he personally examined the patient and participated in formulating the plan.*   Miquel Dunn, MSN, APRN, FNP-C [facility]. [place] Office: [phone] Fax: [phone]

## 2019-07-19 ENCOUNTER — Ambulatory Visit: Payer: Managed Care, Other (non HMO) | Admitting: Rheumatology

## 2019-07-19 ENCOUNTER — Encounter: Payer: Self-pay | Admitting: Cardiology

## 2019-07-22 NOTE — Telephone Encounter (Signed)
Contacted patient to advised labs have resulted and we can send in prescription for PLQ. Patient asked about testing for Sjogren's which was not completed with this set of lab work. Patient would like to know if you would like for her to have this test completed prior to her starting the PLQ to make sure she does need it. Please advise.

## 2019-07-22 NOTE — Telephone Encounter (Signed)
Patient advised per Dr. Estanislado Pandy she does not need repeat labs.  She already has positive ANA and positive Ro antibody.  She has sicca symptoms.  Based on that she should be on Plaquenil.  If she would prefer to repeat her antibodies we can do them now she can come in for lab work. Patient states she would like to have the antibodies repeated. Order placed.

## 2019-07-22 NOTE — Addendum Note (Signed)
Addended by: Carole Binning on: 07/22/2019 03:04 PM   Modules accepted: Orders

## 2019-07-22 NOTE — Telephone Encounter (Signed)
She does not need repeat labs.  She already has positive ANA and positive Ro antibody.  She has sicca symptoms.  Based on that she should be on Plaquenil.  If she would prefer to repeat her antibodies we can do them now she can come in for lab work.

## 2019-07-23 ENCOUNTER — Other Ambulatory Visit: Payer: Self-pay

## 2019-07-23 DIAGNOSIS — M3509 Sicca syndrome with other organ involvement: Secondary | ICD-10-CM

## 2019-07-23 DIAGNOSIS — R768 Other specified abnormal immunological findings in serum: Secondary | ICD-10-CM

## 2019-07-23 DIAGNOSIS — R7689 Other specified abnormal immunological findings in serum: Secondary | ICD-10-CM

## 2019-07-24 LAB — SJOGRENS SYNDROME-B EXTRACTABLE NUCLEAR ANTIBODY: SSB (La) (ENA) Antibody, IgG: <1 AI

## 2019-07-24 LAB — SJOGRENS SYNDROME-A EXTRACTABLE NUCLEAR ANTIBODY: SSA (Ro) (ENA) Antibody, IgG: >8 AI — AB

## 2019-07-24 NOTE — Progress Notes (Signed)
Patient is positive for anti-Ro antibody ,which is unchanged.  We discussed starting on Plaquenil at the last visit.  If she is in agreement we should start her on Plaquenil 200 mg p.o. twice daily.  Please call in 90-day supply.  She should get CBC and CMP in 1 month and 3 months.  She should have a follow-up visit in 3 months.

## 2019-07-25 ENCOUNTER — Telehealth: Payer: Self-pay | Admitting: *Deleted

## 2019-07-25 DIAGNOSIS — M3509 Sicca syndrome with other organ involvement: Secondary | ICD-10-CM

## 2019-07-25 DIAGNOSIS — Z79899 Other long term (current) drug therapy: Secondary | ICD-10-CM

## 2019-07-25 MED ORDER — HYDROXYCHLOROQUINE SULFATE 200 MG PO TABS: 200.0000 mg | ORAL_TABLET | Freq: Two times a day (BID) | ORAL | 0 refills | Status: DC

## 2019-07-25 NOTE — Telephone Encounter (Signed)
-----   Message from Bo Merino, MD sent at 07/24/2019  4:41 PM EST ----- Patient is positive for anti-Ro antibody ,which is unchanged.  We discussed starting on Plaquenil at the last visit.  If she is in agreement we should start her on Plaquenil 200 mg p.o. twice daily.  Please call in 90-day supply.  She should get CBC  and CMP in 1 month and 3 months.  She should have a follow-up visit in 3 months.

## 2019-07-31 ENCOUNTER — Ambulatory Visit (INDEPENDENT_AMBULATORY_CARE_PROVIDER_SITE_OTHER): Payer: Managed Care, Other (non HMO) | Admitting: Orthopaedic Surgery

## 2019-07-31 ENCOUNTER — Other Ambulatory Visit: Payer: Self-pay

## 2019-07-31 ENCOUNTER — Encounter: Payer: Self-pay | Admitting: Orthopaedic Surgery

## 2019-07-31 VITALS — BP 124/79 | HR 71 | Wt 194.0 lb

## 2019-07-31 DIAGNOSIS — M170 Bilateral primary osteoarthritis of knee: Secondary | ICD-10-CM

## 2019-07-31 NOTE — Progress Notes (Signed)
Office Visit Note   Patient: [name] [name]           Date of Birth: 11-08-1955           MRN: [id number] Visit Date: 07/31/2019              Requested by: Kendrick Ranch, MD [address] [place],  [place] [address] PCP: Kendrick Ranch, MD   Assessment & Plan: Visit Diagnoses:  1. Primary osteoarthritis of both knees     Plan: Patient has knee x-rays only show tiny osteophytes and trace joint narrowing and are consistent with mild osteoarthritis.  Persistant synovitis in the right knee may be related to Sjogren's but she is doing much better after the injection as well as Plaquenil and prednisone taper and is walking better.  She still concerned about slight swelling.  I plan to recheck her in 2 months if she still having any symptoms we can proceed with MRI scan.  At present she has no locking or instability symptoms and is walking well without limping.  Follow-Up Instructions: Return in about 2 months (around 09/30/2019).   Orders:  No orders of the defined types were placed in this encounter.  No orders of the defined types were placed in this encounter.     Procedures: No procedures performed   Clinical Data: No additional findings.   Subjective: Chief Complaint  Patient presents with  . Right Knee - Follow-up    HPI 63 year old female with Sjogren's syndrome and bilateral knee mild osteoarthritis had previous cortisone injection 07/04/2019 in the right knee with some improvement but persistent swelling.  She denies catching or locking.  She has been on Plaquenil and is finishing up prednisone.  She has had no pain medication she states her knee feels slightly tight with activities she is not had any locking no fever or chills.  Opposite left knee is not giving her much symptoms at all.  She denies groin pain.  Review of Systems 14 point review of systems updated unchanged from 07/04/2019 office visit.  Objective: Vital Signs: BP 124/79    Pulse 71   Wt 194 lb (88 kg)   BMI 31.31 kg/m   Physical Exam Constitutional:      Appearance: She is well-developed.  HENT:     Head: Normocephalic.     Right Ear: External ear normal.     Left Ear: External ear normal.  Eyes:     Pupils: Pupils are equal, round, and reactive to light.  Neck:     Thyroid: No thyromegaly.     Trachea: No tracheal deviation.  Cardiovascular:     Rate and Rhythm: Normal rate.  Pulmonary:     Effort: Pulmonary effort is normal.  Abdominal:     Palpations: Abdomen is soft.  Skin:    General: Skin is warm and dry.  Neurological:     Mental Status: She is alert and oriented to person, place, and time.  Psychiatric:        Behavior: Behavior normal.     Ortho Exam patient is amatory without a limp.  There is a 2+ knee effusion.  No increased warmth.  Collateral crucial ligament exam is stable mild crepitus knee extension no pain with hyperextension no joint line tenderness.  Opposite left knee shows no effusion.  ACL PCL exam right and left knee has normal pes bursa is normal.  No synovitis of her fingers or wrist.  Specialty Comments:  No specialty comments available.  Imaging: No results found.   PMFS History: Patient Active Problem List   Diagnosis Date Noted  . Primary osteoarthritis of both knees 07/30/2018  . Primary osteoarthritis of both hands 07/30/2018  . Foot pain 05/15/2018  . Obstructive sleep apnea on CPAP 02/23/2017  . 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 Snoring 03/25/2015  . 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 Acute pancreatitis 10/26/2011  . Nausea and vomiting in adult 10/26/2011  . Dehydration 10/26/2011  . Gastroenteritis 10/26/2011  . Hypokalemia 10/26/2011  . Elevated liver function tests 10/26/2011  . Acid reflux   . Thyroid disease    Past Medical History:  Diagnosis Date  . Abnormal liver enzymes   . Acid reflux   . Arthritis    back and neck  .  Bloating   . Bronchitis   . Chest pain, unspecified   . Diarrhea   . Dyspepsia   . Esophageal reflux   . H/O acute pancreatitis   . H/O hiatal hernia   . Headache(784.0)    migraines  . Hemorrhage of rectum and anus   . Hypercholesteremia   . Nausea   . Spinal stenosis, cervical region   . Thyroid disease   . Thyroid nodule     Family History  Problem Relation Age of Onset  . Colon cancer Mother   . Pancreatic cancer Brother   . Healthy Son   . Healthy Daughter     Past Surgical History:  Procedure Laterality Date  . ABDOMINAL HYSTERECTOMY  2007   total with BSO  . CHOLECYSTECTOMY    . COLONOSCOPY    . ECTOPIC PREGNANCY SURGERY    . ESOPHAGOGASTRODUODENOSCOPY    . EUS  12/21/2011   Procedure: ESOPHAGEAL ENDOSCOPIC ULTRASOUND (EUS) RADIAL;  Surgeon: Arta Silence, MD;  Location: [facility];  Service: Endoscopy;  Laterality: N/A;  . eye cautery    . EYE SURGERY     lasik  . EYE SURGERY Left    cataract extraction    Social History   Occupational History  . Not on file  Tobacco Use  . Smoking status: Never Smoker  . Smokeless tobacco: Never Used  Substance and Sexual Activity  . Alcohol use: Yes    Comment: rarely   . Drug use: No  . Sexual activity: Yes    Birth control/protection: None

## 2019-08-12 ENCOUNTER — Telehealth: Payer: Self-pay | Admitting: Rheumatology

## 2019-08-12 NOTE — Telephone Encounter (Signed)
Spoke with patient and advised patient to get a baseline eye exam as soon as she can schedule it (preferably within the next month) and then the eye exam is required on a yearly basis unless opthalmology states otherwise. Patient verbalized understanding.

## 2019-08-12 NOTE — Telephone Encounter (Signed)
Patient would like to know when she will be due for a Plaquenil Eye exam now that she has started generic Plaquenil? Please call to advise.

## 2019-08-13 ENCOUNTER — Ambulatory Visit: Payer: Managed Care, Other (non HMO) | Admitting: Rheumatology

## 2019-08-21 ENCOUNTER — Ambulatory Visit (INDEPENDENT_AMBULATORY_CARE_PROVIDER_SITE_OTHER): Payer: Managed Care, Other (non HMO) | Admitting: Orthopaedic Surgery

## 2019-08-21 ENCOUNTER — Encounter: Payer: Self-pay | Admitting: Orthopaedic Surgery

## 2019-08-21 ENCOUNTER — Other Ambulatory Visit: Payer: Self-pay

## 2019-08-21 ENCOUNTER — Ambulatory Visit: Payer: Managed Care, Other (non HMO) | Admitting: Surgery

## 2019-08-21 VITALS — BP 126/78 | HR 68 | Ht 66.0 in | Wt 194.0 lb

## 2019-08-21 DIAGNOSIS — M25561 Pain in right knee: Secondary | ICD-10-CM | POA: Diagnosis not present

## 2019-08-21 DIAGNOSIS — M170 Bilateral primary osteoarthritis of knee: Secondary | ICD-10-CM

## 2019-08-21 NOTE — Progress Notes (Signed)
Office Visit Note   Patient: [name] [name]           Date of Birth: 1956-07-13           MRN: [id number] Visit Date: 08/21/2019              Requested by: Kendrick Ranch, MD [address] [place],  [place] [address] PCP: Kendrick Ranch, MD   Assessment & Plan: Visit Diagnoses:  1. Acute pain of right knee   2. Primary osteoarthritis of both knees     Plan: With patient's knee effusion failure to respond to intra-articular cortisone injection exercise program anti-inflammatories patient states she like proceed with an MRI scan.  She does have some mild osteoarthritic changes with spurring noted on plain radiographs but this is not severe.  She has catching in her knee but no true locking and has noted some giving way symptoms.  Office follow-up after right knee MRI scan.  Follow-Up Instructions: Return in about 3 weeks (around 09/11/2019).   Orders:  Orders Placed This Encounter  Procedures  . MR Knee Right w/o contrast   No orders of the defined types were placed in this encounter.     Procedures: No procedures performed   Clinical Data: No additional findings.   Subjective: Chief Complaint  Patient presents with  . Right Knee - Pain    HPI 63 year old female returns with increased problems with right knee pain swelling stiffness that began after she was shooting some baskets with her husband in the driveway.  Since that time she has had difficulty walking tightness in her knee difficulty sitting flexing to 90 degrees.  She was diagnosed with Sjogren's syndrome by Dr. Corliss Skains.  She has been started on Plaquenil. Patient had anti-inflammatories in the past and can restart Aleve 2 p.o. twice daily.  She is used ice and has had previous cortisone injection with persistent knee symptoms.  Plain radiographs that showed mild knee arthritis with medial more than lateral compartment spurring and some patellofemoral degenerative changes. Review of  Systems positive Sjogren's syndrome history of acute prior pancreatitis.  Acid reflux.  Sleep apnea on CPAP.  Acute right knee swelling.   Objective: Vital Signs: BP 126/78   Pulse 68   Ht 5\' 6"  (1.676 m)   Wt 194 lb (88 kg)   BMI 31.31 kg/m   Physical Exam Constitutional:      Appearance: She is well-developed.  HENT:     Head: Normocephalic.     Right Ear: External ear normal.     Left Ear: External ear normal.  Eyes:     Pupils: Pupils are equal, round, and reactive to light.  Neck:     Thyroid: No thyromegaly.     Trachea: No tracheal deviation.  Cardiovascular:     Rate and Rhythm: Normal rate.  Pulmonary:     Effort: Pulmonary effort is normal.  Abdominal:     Palpations: Abdomen is soft.  Skin:    General: Skin is warm and dry.  Neurological:     Mental Status: She is alert and oriented to person, place, and time.  Psychiatric:        Behavior: Behavior normal.     Ortho Exam patient is a 3+ right knee effusion.  No increased warmth.  She has more lateral than medial joint line tenderness.  Some crepitus knee flexion extension no Baker's cyst pes bursa is normal.  Normal patellar tracking.  Negative logroll  to the hips distal pulses are 2+.  No synovitis or instability of the opposite left knee on exam.  Right knee ACL PCL and collateral ligaments are normal to testing.  Specialty Comments:  No specialty comments available.  Imaging: No results found.   PMFS History: Patient Active Problem List   Diagnosis Date Noted  . Primary osteoarthritis of both knees 07/30/2018  . Primary osteoarthritis of both hands 07/30/2018  . Foot pain 05/15/2018  . Obstructive sleep apnea on CPAP 02/23/2017  . 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 Snoring 03/25/2015  . 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 Acute pancreatitis 10/26/2011  . Nausea and vomiting in adult 10/26/2011  . Dehydration 10/26/2011  . Gastroenteritis  10/26/2011  . Hypokalemia 10/26/2011  . Elevated liver function tests 10/26/2011  . Acid reflux   . Thyroid disease    Past Medical History:  Diagnosis Date  . Abnormal liver enzymes   . Acid reflux   . Arthritis    back and neck  . Bloating   . Bronchitis   . Chest pain, unspecified   . Diarrhea   . Dyspepsia   . Esophageal reflux   . H/O acute pancreatitis   . H/O hiatal hernia   . Headache(784.0)    migraines  . Hemorrhage of rectum and anus   . Hypercholesteremia   . Nausea   . Spinal stenosis, cervical region   . Thyroid disease   . Thyroid nodule     Family History  Problem Relation Age of Onset  . Colon cancer Mother   . Pancreatic cancer Brother   . Healthy Son   . Healthy Daughter     Past Surgical History:  Procedure Laterality Date  . ABDOMINAL HYSTERECTOMY  2007   total with BSO  . CHOLECYSTECTOMY    . COLONOSCOPY    . ECTOPIC PREGNANCY SURGERY    . ESOPHAGOGASTRODUODENOSCOPY    . EUS  12/21/2011   Procedure: ESOPHAGEAL ENDOSCOPIC ULTRASOUND (EUS) RADIAL;  Surgeon: Arta Silence, MD;  Location: [facility];  Service: Endoscopy;  Laterality: N/A;  . eye cautery    . EYE SURGERY     lasik  . EYE SURGERY Left    cataract extraction    Social History   Occupational History  . Not on file  Tobacco Use  . Smoking status: Never Smoker  . Smokeless tobacco: Never Used  Substance and Sexual Activity  . Alcohol use: Yes    Comment: rarely   . Drug use: No  . Sexual activity: Yes    Birth control/protection: None

## 2019-09-05 ENCOUNTER — Other Ambulatory Visit: Payer: Self-pay

## 2019-09-05 ENCOUNTER — Ambulatory Visit
Admission: RE | Admit: 2019-09-05 | Discharge: 2019-09-05 | Disposition: A | Discharge status: Home / Self Care | Payer: Managed Care, Other (non HMO) | Source: Ambulatory Visit | Attending: Orthopaedic Surgery | Admitting: Orthopaedic Surgery

## 2019-09-05 DIAGNOSIS — M25561 Pain in right knee: Secondary | ICD-10-CM

## 2019-09-11 ENCOUNTER — Other Ambulatory Visit: Payer: Self-pay

## 2019-09-11 ENCOUNTER — Ambulatory Visit: Payer: Managed Care, Other (non HMO) | Admitting: Rheumatology

## 2019-09-11 ENCOUNTER — Ambulatory Visit (INDEPENDENT_AMBULATORY_CARE_PROVIDER_SITE_OTHER): Payer: Managed Care, Other (non HMO) | Admitting: Orthopaedic Surgery

## 2019-09-11 ENCOUNTER — Encounter: Payer: Self-pay | Admitting: Orthopaedic Surgery

## 2019-09-11 VITALS — Ht 66.0 in | Wt 194.0 lb

## 2019-09-11 DIAGNOSIS — M25561 Pain in right knee: Secondary | ICD-10-CM | POA: Diagnosis not present

## 2019-09-11 NOTE — Progress Notes (Signed)
Office Visit Note   Patient: [name] [name]           Date of Birth: [birth year]           MRN: [id number] Visit Date: 09/11/2019              Requested by: Lanice Shirts, MD [address] [place],  [place] [address] PCP: Lanice Shirts, MD   Assessment & Plan: Visit Diagnoses:  1. Acute pain of right knee   2.    Right knee lateral posterior meniscal tear  Plan: Patient does have posterior meniscal tear but also has some degenerative changes tricompartmental with areas of partial and some areas of more severe cartilage loss.  She not responded to anti-inflammatories and intra-articular injection.  We will try some physical therapy for strengthening, modalities and check her back again in several weeks.  We might consider a possible Visco injection.  We discussed arthroscopy with her torn meniscus and she is occasionally having catching but no true locking and we discussed variable results with arthroscopy when torn meniscus are present but patient also has some arthritic changes in the knee.  We also discussed total knee arthroplasty.  Hopefully should get some relief with further conservative treatment we will check her back after some therapy.  Follow-Up Instructions: Return in about 2 months (around 11/09/2019).   Orders:  Orders Placed This Encounter  Procedures  . Ambulatory referral to Physical Therapy   No orders of the defined types were placed in this encounter.     Procedures: No procedures performed   Clinical Data: No additional findings.   Subjective: Chief Complaint  Patient presents with  . Right Knee - Pain, Follow-up    MRI Review    HPI 64 year old female returns with ongoing problems with her right knee.  Previous injection gave her temporary relief.  She has been using ibuprofen appropriate dose with possibly some improvement she is not really sure.  Pain hurts at the end of the day she has problems with ambulation sometimes  has been limping.  MRI scan of her knee has been obtained and is available for review.  Previous x-ray showed some mild osteophyte formation mild narrowing consistent with mild to moderate osteoarthritis.  Review of Systems diagnosed with Sjogren's syndrome, rheumatology follow-up delayed due to Covid.  She has been on Plaquenil.  Previously was on some prednisone.  Otherwise 14 point systems unchanged.  She denies fever chills no history of gout.   Objective: Vital Signs: Ht 5\' 6"  (1.676 m)   Wt 194 lb (88 kg)   BMI 31.31 kg/m   Physical Exam Constitutional:      Appearance: She is well-developed.  HENT:     Head: Normocephalic.     Right Ear: External ear normal.     Left Ear: External ear normal.  Eyes:     Pupils: Pupils are equal, round, and reactive to light.  Neck:     Thyroid: No thyromegaly.     Trachea: No tracheal deviation.  Cardiovascular:     Rate and Rhythm: Normal rate.  Pulmonary:     Effort: Pulmonary effort is normal.  Abdominal:     Palpations: Abdomen is soft.  Skin:    General: Skin is warm and dry.  Neurological:     Mental Status: She is alert and oriented to person, place, and time.  Psychiatric:        Behavior: Behavior normal.  Ortho Exam patient has slight knee effusion.  Tenderness medially and posteriorly.  Some pain with patellar loading and quadriceps contracture negative patellar subluxation ACL PCL exam is normal.  No Baker's cyst.  Negative pivot shift.  Crepitus with knee extension worse on the right than left knee.  She ambulates with a slight knee limp on the right.  Distal pulses are intact negative logroll of the hips no sciatic notch tenderness.  Specialty Comments:  No specialty comments available.  Imaging: No results found. CLINICAL DATA:  No known injury, pain and swelling.  EXAM: MRI OF THE RIGHT KNEE WITHOUT CONTRAST  TECHNIQUE: Multiplanar, multisequence MR imaging of the knee was performed. No intravenous  contrast was administered.  COMPARISON:  None.  FINDINGS: MENISCI  Medial meniscus:  Intact.  Lateral meniscus: Oblique tear of the posterior horn of the lateral meniscus extending to the inferior articular surface. Horizontal tear of the body and anterior horn of lateral meniscus extending to the superior articular surface of the anterior horn.  LIGAMENTS  Cruciates: Intact ACL. ACL is increased in signal and expanded consistent with mucinous degeneration. Intact PCL.  Collaterals: Medial collateral ligament is intact. Lateral collateral ligament complex is intact.  CARTILAGE  Patellofemoral: High-grade partial-thickness cartilage loss of the lateral patellofemoral compartment. Partial-thickness cartilage loss of the medial patellofemoral compartment.  Medial: Mild partial-thickness cartilage loss of the medial femorotibial compartment.  Lateral: Partial thickness cartilage loss of the lateral femorotibial compartment with areas of high-grade partial-thickness cartilage loss and full-thickness cartilage loss particularly along the lateral tibial plateau with subchondral reactive marrow edema in the lateral tibial plateau.  Joint: Large joint effusion. Normal Hoffa's fat. No plical thickening.  Popliteal Fossa:  No Baker cyst. Intact popliteus tendon.  Extensor Mechanism: Intact quadriceps tendon. Mild tendinosis of the proximal patellar tendon. Intact medial patellar retinaculum. Intact lateral patellar retinaculum. Intact MPFL.  Bones: No acute osseous abnormality. No aggressive osseous lesion. Small T2 hyperintense 10 mm intramedullary bone lesion in the proximal tibial metaphysis with stippled low signal centrally most consistent with a benign chondroid lesion such as an enchondroma.  Other: No fluid collection or hematoma.  IMPRESSION: 1. Oblique tear of the posterior horn of the lateral meniscus extending to the inferior articular surface.  Horizontal tear of the body and anterior horn of lateral meniscus extending to the superior articular surface of the anterior horn. 2. Tricompartmental cartilage abnormalities as described above. 3. Large joint effusion. 4. Mild tendinosis of the proximal patellar tendon. 5. Intact ACL with mucinous degeneration.   Electronically Signed   By: Elige Ko   On: 09/05/2019 07:57   PMFS History: Patient Active Problem List   Diagnosis Date Noted  . Primary osteoarthritis of both knees 07/30/2018  . Primary osteoarthritis of both hands 07/30/2018  . Foot pain 05/15/2018  . Obstructive sleep apnea on CPAP 02/23/2017  . 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 Snoring 03/25/2015  . 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 Acute pancreatitis 10/26/2011  . Nausea and vomiting in adult 10/26/2011  . Dehydration 10/26/2011  . Gastroenteritis 10/26/2011  . Hypokalemia 10/26/2011  . Elevated liver function tests 10/26/2011  . Acid reflux   . Thyroid disease    Past Medical History:  Diagnosis Date  . Abnormal liver enzymes   . Acid reflux   . Arthritis    back and neck  . Bloating   . Bronchitis   . Chest pain, unspecified   . Diarrhea   .  Dyspepsia   . Esophageal reflux   . H/O acute pancreatitis   . H/O hiatal hernia   . Headache(784.0)    migraines  . Hemorrhage of rectum and anus   . Hypercholesteremia   . Nausea   . Spinal stenosis, cervical region   . Thyroid disease   . Thyroid nodule     Family History  Problem Relation Age of Onset  . Colon cancer Mother   . Pancreatic cancer Brother   . Healthy Son   . Healthy Daughter     Past Surgical History:  Procedure Laterality Date  . ABDOMINAL HYSTERECTOMY  2007   total with BSO  . CHOLECYSTECTOMY    . COLONOSCOPY    . ECTOPIC PREGNANCY SURGERY    . ESOPHAGOGASTRODUODENOSCOPY    . EUS  12/21/2011   Procedure: ESOPHAGEAL ENDOSCOPIC ULTRASOUND (EUS) RADIAL;   Surgeon: Willis Modena, MD;  Location: [facility];  Service: Endoscopy;  Laterality: N/A;  . eye cautery    . EYE SURGERY     lasik  . EYE SURGERY Left    cataract extraction    Social History   Occupational History  . Not on file  Tobacco Use  . Smoking status: Never Smoker  . Smokeless tobacco: Never Used  Substance and Sexual Activity  . Alcohol use: Yes    Comment: rarely   . Drug use: No  . Sexual activity: Yes    Birth control/protection: None

## 2019-09-18 ENCOUNTER — Telehealth: Payer: Self-pay | Admitting: Orthopaedic Surgery

## 2019-09-18 MED ORDER — TRAMADOL HCL 50 MG PO TABS: 50.0000 mg | ORAL_TABLET | Freq: Two times a day (BID) | ORAL | 0 refills | Status: DC | PRN

## 2019-09-18 NOTE — Telephone Encounter (Signed)
Please advise 

## 2019-09-18 NOTE — Addendum Note (Signed)
Addended by: Rogers Seeds on: 09/18/2019 02:25 PM   Modules accepted: Orders

## 2019-09-18 NOTE — Telephone Encounter (Signed)
Call in ultram # 30  one po bid prn pain thanks

## 2019-09-18 NOTE — Telephone Encounter (Signed)
Called to pharmacy. I called patient and advised. 

## 2019-09-18 NOTE — Telephone Encounter (Signed)
Patient called. She would like to know what she can take for pain. Her call back number is [phone]

## 2019-09-23 ENCOUNTER — Telehealth: Payer: Self-pay | Admitting: Rheumatology

## 2019-09-23 NOTE — Telephone Encounter (Signed)
Patient advised per Dr. Corliss Skains, she recommends the Covid vaccine. Patient advised she would not need to discontinue her medication to have the vaccine. Patient verbalized understanding.

## 2019-09-23 NOTE — Telephone Encounter (Signed)
Please call patient to advise if she can get the COVID vaccine due to diagnosis?  Please call to advise.

## 2019-09-30 ENCOUNTER — Other Ambulatory Visit: Payer: Self-pay | Admitting: Obstetrics and Gynecology

## 2019-09-30 DIAGNOSIS — Z1231 Encounter for screening mammogram for malignant neoplasm of breast: Secondary | ICD-10-CM

## 2019-10-01 ENCOUNTER — Ambulatory Visit: Payer: Managed Care, Other (non HMO) | Admitting: Orthopaedic Surgery

## 2019-10-03 ENCOUNTER — Ambulatory Visit: Payer: Managed Care, Other (non HMO) | Admitting: Physical Therapy

## 2019-10-04 NOTE — Progress Notes (Signed)
Office Visit Note  Patient: [name] [name]             Date of Birth: 19-Sep-1955           MRN: [id number]             PCP: Kendrick Ranch, MD Referring: Kendrick Ranch, * Visit Date: 10/10/2019 Occupation: @GUAROCC @  Subjective:  Right knee joint pain   History of Present Illness: JOVANI COLQUHOUN is a 64 y.o. female with history of Sjogren's syndrome and osteoarthritis.  She is taking Plaquenil 200 mg 1 tablet by mouth twice daily.  She increased the dose of Plaquenil about 2 weeks ago and has been tolerating the dose.  She continues to have eye dryness and has tried using Systane and refresh but experiences increased eye irritation.  She is planning on following up with her ophthalmologist.  She continues to have ongoing right knee joint pain.  She started physical therapy this week and has been wearing a brace for support.    Activities of Daily Living:  Patient reports morning stiffness for 30 minutes.   Patient Reports nocturnal pain.  Difficulty dressing/grooming: Denies Difficulty climbing stairs: Denies Difficulty getting out of chair: Denies Difficulty using hands for taps, buttons, cutlery, and/or writing: Denies  Review of Systems  Constitutional: Negative for fatigue.  HENT: Negative for mouth sores, mouth dryness and nose dryness.   Eyes: Positive for dryness. Negative for pain and visual disturbance.  Respiratory: Negative for cough, hemoptysis, shortness of breath and difficulty breathing.   Cardiovascular: Negative for chest pain, palpitations, hypertension and swelling in legs/feet.  Gastrointestinal: Negative for blood in stool, constipation and diarrhea.  Endocrine: Negative for increased urination.  Genitourinary: Negative for difficulty urinating and painful urination.  Musculoskeletal: Positive for arthralgias, joint pain, joint swelling and morning stiffness. Negative for myalgias, muscle weakness, muscle tenderness and myalgias.  Skin:  Negative for color change, pallor, rash, hair loss, nodules/bumps, skin tightness, ulcers and sensitivity to sunlight.  Allergic/Immunologic: Negative for susceptible to infections.  Neurological: Negative for dizziness, numbness, headaches and weakness.  Hematological: Negative for bruising/bleeding tendency and swollen glands.  Psychiatric/Behavioral: Positive for sleep disturbance. Negative for depressed mood. The patient is not nervous/anxious.     PMFS History:  Patient Active Problem List   Diagnosis Date Noted  . Primary osteoarthritis of both knees 07/30/2018  . Primary osteoarthritis of both hands 07/30/2018  . Foot pain 05/15/2018  . Obstructive sleep apnea on CPAP 02/23/2017  . 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 Snoring 03/25/2015  . 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 Acute pancreatitis 10/26/2011  . Nausea and vomiting in adult 10/26/2011  . Dehydration 10/26/2011  . Gastroenteritis 10/26/2011  . Hypokalemia 10/26/2011  . Elevated liver function tests 10/26/2011  . Acid reflux   . Thyroid disease     Past Medical History:  Diagnosis Date  . Abnormal liver enzymes   . Acid reflux   . Arthritis    back and neck  . Bloating   . Bronchitis   . Chest pain, unspecified   . Diarrhea   . Dyspepsia   . Esophageal reflux   . H/O acute pancreatitis   . H/O hiatal hernia   . Headache(784.0)    migraines  . Hemorrhage of rectum and anus   . Hypercholesteremia   . Nausea   . Sjogren's disease (HCC)   . Spinal stenosis, cervical region   . Thyroid disease   .  Thyroid nodule     Family History  Problem Relation Age of Onset  . Colon cancer Mother   . Pancreatic cancer Brother   . Healthy Son   . Healthy Daughter    Past Surgical History:  Procedure Laterality Date  . ABDOMINAL HYSTERECTOMY  2007   total with BSO  . CHOLECYSTECTOMY    . COLONOSCOPY    . ECTOPIC PREGNANCY SURGERY    .  ESOPHAGOGASTRODUODENOSCOPY    . EUS  12/21/2011   Procedure: ESOPHAGEAL ENDOSCOPIC ULTRASOUND (EUS) RADIAL;  Surgeon: Willis Modena, MD;  Location: [facility];  Service: Endoscopy;  Laterality: N/A;  . eye cautery    . EYE SURGERY     lasik  . EYE SURGERY Left    cataract extraction    Social History   Social History Narrative   Married.   Drinks occasional sweet tea.   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    There is no immunization history on file for this patient.   Objective: Vital Signs: BP 123/77 (BP Location: Left Arm, Patient Position: Sitting, Cuff Size: Normal)   Pulse 78   Resp 16   Ht 5\' 6"  (1.676 m)   Wt 197 lb 9.6 oz (89.6 kg)   BMI 31.89 kg/m    Physical Exam Vitals and nursing note reviewed.  Constitutional:      Appearance: She is well-developed.  HENT:     Head: Normocephalic and atraumatic.  Eyes:     Conjunctiva/sclera: Conjunctivae normal.  Cardiovascular:     Rate and Rhythm: Normal rate and regular rhythm.     Heart sounds: Normal heart sounds.  Pulmonary:     Effort: Pulmonary effort is normal.     Breath sounds: Normal breath sounds.  Abdominal:     General: Bowel sounds are normal.     Palpations: Abdomen is soft.  Musculoskeletal:     Cervical back: Normal range of motion.  Lymphadenopathy:     Cervical: No cervical adenopathy.  Skin:    General: Skin is warm and dry.     Capillary Refill: Capillary refill takes less than 2 seconds.  Neurological:     Mental Status: She is alert and oriented to person, place, and time.  Psychiatric:        Behavior: Behavior normal.      Musculoskeletal Exam: C-spine, thoracic spine, and lumbar spine good ROM.  Shoulder joints, elbow joints, wrist joints, MCPs and PIPs and DIPs good range of motion no synovitis.  She has DIP synovial thickening consistent with osteoarthritis of both hands.  Hip joints have good range of motion with no discomfort.  Knee joints have good range  of motion.  She has ongoing warmth in the right knee joint but no effusion was noted.  Left knee has no warmth or effusion.  Ankle joints have good range of motion with no tenderness or inflammation.  CDAI Exam: CDAI Score: -- Patient Global: --; Provider Global: -- Swollen: --; Tender: -- Joint Exam 10/10/2019   No joint exam has been documented for this visit   There is currently no information documented on the homunculus. Go to the Rheumatology activity and complete the homunculus joint exam.  Investigation: No additional findings.  Imaging: No results found.  Recent Labs: Lab Results  Component Value Date   WBC 5.7 07/16/2019   HGB 13.8 07/16/2019   PLT 247 07/16/2019   NA 142 07/16/2019   K 4.0 07/16/2019   CL 104 07/16/2019  CO2 28 07/16/2019   GLUCOSE 92 07/16/2019   BUN 18 07/16/2019   CREATININE 0.74 07/16/2019   BILITOT 1.2 07/16/2019   ALKPHOS 84 10/26/2011   AST 13 07/16/2019   ALT 13 07/16/2019   PROT 7.3 07/16/2019   ALBUMIN 3.4 (L) 10/26/2011   CALCIUM 10.0 07/16/2019   GFRAA 101 07/16/2019    Speciality Comments: PLQ eye exam: 08/27/2019 normal. [facility]. Follow up in 6 months.  Procedures:  No procedures performed Allergies: Morphine and related, Other, and Sulfa antibiotics   Assessment / Plan:     Visit Diagnoses: Sjogren's syndrome with other organ involvement ([place]) - ANA 1: 80 speckled, positive Ro antibody, history of dry eyes and dry skin: She has persistent eye dryness.  She has tried using Systane and refresh eyedrops but experiences increased eye irritation.  She is planning on following up with her ophthalmologist and is going to ask about Restasis orders hydra.  She has not had any mouth dryness recently.  She is tolerating Plaquenil 200 mg 1 tablet twice daily.  She increased the dose to twice a day as recommended about 2 weeks ago.  She will continue taking Plaquenil as prescribed.  She does not need any refills at this time.  We  will check CBC and CMP today to monitor for drug toxicity.  She was advised to notify us if she develops any new or worsening symptoms.  She will follow-up in the office in 5 months.  High risk medication use -Plaquenil 200 mg 1 tablet twice daily.  She started on Plaquenil November 2020.  CBC and CMP were drawn today to monitor for drug toxicity.  She will be due to update lab work every 5 months.- Plan: CBC with Differential/Platelet, COMPLETE METABOLIC PANEL WITH GFR  Antineutrophil cytoplasmic antibody (ANCA) positive - Patient has no clinical features of vasculitis.  Sed rate was normal.  Primary osteoarthritis of both hands: She has DIP synovial thickening consistent with osteoarthritis of both hands.  She has no tenderness or synovitis on exam.  She has complete fist formation bilaterally.  Joint protection and muscle strengthening were discussed.  Primary osteoarthritis of both knees: She has persistent right knee joint pain.  She has warmth of the right knee but no effusion on exam.  She started physical therapy this week and has started wearing a brace for support which has been helping.  She is not experiencing any left knee joint pain at this time.  Other fatigue: She is not experiencing any increased fatigue at this time.  Other medical conditions are listed as follows:  History of thyroid disease  History of sleep apnea  History of hypokalemia  Orders: Orders Placed This Encounter  Procedures  . CBC with Differential/Platelet  . COMPLETE METABOLIC PANEL WITH GFR   No orders of the defined types were placed in this encounter.    Follow-Up Instructions: Return in about 5 months (around 03/08/2020) for Sjogren's syndrome, Osteoarthritis.   Ofilia Neas, PA-C   I examined and evaluated the patient with Hazel Sams PA.  Patient symptoms are manageable currently.  She had no synovitis on examination.  She will continue to use over-the-counter products.  She has been  tolerating Plaquenil well.  The plan of care was discussed as noted above.  Bo Merino, MD Note - This record has been created using Editor, commissioning.  Chart creation errors have been sought, but may not always  have been located. Such creation errors do not reflect on  the standard of medical care.

## 2019-10-08 ENCOUNTER — Other Ambulatory Visit: Payer: Self-pay

## 2019-10-08 ENCOUNTER — Encounter: Payer: Self-pay | Admitting: Physical Therapy

## 2019-10-08 ENCOUNTER — Ambulatory Visit (INDEPENDENT_AMBULATORY_CARE_PROVIDER_SITE_OTHER): Payer: Managed Care, Other (non HMO) | Admitting: Physical Therapy

## 2019-10-08 DIAGNOSIS — R600 Localized edema: Secondary | ICD-10-CM | POA: Diagnosis not present

## 2019-10-08 DIAGNOSIS — M6281 Muscle weakness (generalized): Secondary | ICD-10-CM

## 2019-10-08 DIAGNOSIS — R2689 Other abnormalities of gait and mobility: Secondary | ICD-10-CM

## 2019-10-08 DIAGNOSIS — M25561 Pain in right knee: Secondary | ICD-10-CM | POA: Diagnosis not present

## 2019-10-08 NOTE — Therapy (Signed)
[facility] [address] [place], [place], [address] Phone: [phone]   Fax:  [phone]  Physical Therapy Evaluation  Patient Details  Name: [name] [name] MRN: [id number] Date of Birth: 1956/08/23 Referring Provider (PT): Eldred Manges, MD   Encounter Date: 10/08/2019  PT End of Session - 10/08/19 2341    Visit Number  1    Number of Visits  12    Date for PT Re-Evaluation  11/19/19    Authorization Type  [employer]ign[employer] managed    PT Start Time  1530    PT Stop Time  1615    PT Time Calculation (min)  45 min    Activity Tolerance  Patient tolerated treatment well    Behavior During Therapy  [facility] for tasks assessed/performed       Past Medical History:  Diagnosis Date  . Abnormal liver enzymes   . Acid reflux   . Arthritis    back and neck  . Bloating   . Bronchitis   . Chest pain, unspecified   . Diarrhea   . Dyspepsia   . Esophageal reflux   . H/O acute pancreatitis   . H/O hiatal hernia   . Headache(784.0)    migraines  . Hemorrhage of rectum and anus   . Hypercholesteremia   . Nausea   . Spinal stenosis, cervical region   . Thyroid disease   . Thyroid nodule     Past Surgical History:  Procedure Laterality Date  . ABDOMINAL HYSTERECTOMY  2007   total with BSO  . CHOLECYSTECTOMY    . COLONOSCOPY    . ECTOPIC PREGNANCY SURGERY    . ESOPHAGOGASTRODUODENOSCOPY    . EUS  12/21/2011   Procedure: ESOPHAGEAL ENDOSCOPIC ULTRASOUND (EUS) RADIAL;  Surgeon: Willis Modena, MD;  Location: [facility];  Service: Endoscopy;  Laterality: N/A;  . eye cautery    . EYE SURGERY     lasik  . EYE SURGERY Left    cataract extraction     There were no vitals filed for this visit.   Subjective Assessment - 10/08/19 1538    Subjective  Relays Rt knee pain since July 2020, Unknown Mechanism of injury but does remember her knee hurting more after getting treatments for neuropathy were she was standing on vibration platform. She saw MD and  ended up having imaging showing  lateral meniscal tear, knee OA, and patella tendonosis on MRI. Will try conservative PT first.    Pertinent History  cervical stenosis    Limitations  Sitting;Lifting;Standing;Walking;House hold activities    How long can you sit comfortably?  does not hurt while sitting but gets tight then hurts when she stands up    How long can you stand comfortably?  knee starts swelling after a couple hours    How long can you walk comfortably?  depends    Diagnostic tests  lateral meniscal tear, knee OA, and patella tendonosis on MRI.    Patient Stated Goals  get the pain down, get back to walking normal    Currently in Pain?  Yes    Pain Score  8     Pain Location  Knee    Pain Orientation  Right    Pain Descriptors / Indicators  Aching    Pain Type  Acute pain    Pain Radiating Towards  denies    Pain Onset  More than a month ago    Pain Frequency  Intermittent    Aggravating Factors  sitting, getting out of bed or up from chair, straightening her leg    Pain Relieving Factors  rest, ice    Multiple Pain Sites  No         [facility] PT Assessment - 10/08/19 0001      Assessment   Medical Diagnosis  Acute pain of Rt knee, lateral meniscal tear, knee OA, and patella tendonosis    Referring Provider (PT)  Eldred Manges, MD    Onset Date/Surgical Date  --   July 2020   Next MD Visit  PRN    Prior Therapy  nothing recent      Precautions   Precautions  None      Restrictions   Weight Bearing Restrictions  No      Balance Screen   Has the patient fallen in the past 6 months  No      Home Environment   Living Environment  Private residence    Additional Comments  has steps and has to go one step at a time      Prior Function   Level of Independence  Independent      Cognition   Overall Cognitive Status  Within Functional Limits for tasks assessed      Observation/Other Assessments   Observations  mild edema      Sensation   Light Touch  Appears  Intact      Coordination   Gross Motor Movements are Fluid and Coordinated  Yes      ROM / Strength   AROM / PROM / Strength  AROM;Strength      AROM   Overall AROM Comments  --    AROM Assessment Site  --    Right/Left Knee  --    Right Knee Extension  5    Right Knee Flexion  120      Strength   Overall Strength Comments  Rt hip and knee 4+, Lt hip/knee 5, tested grossly in sitting, decreased neuromuscular control and quad contraction      Flexibility   Soft Tissue Assessment /Muscle Length  --   mod tight H.S. and quads on Rt     Palpation   Patella mobility  TTP anterior and lateral knee, good patella mobility      Transfers   Transfers  Independent with all Transfers   increased time needed due to pain, knee stiffness     Ambulation/Gait   Gait Comments  mild antalgic gait, slower velocity and decreased knee extension with gait, is mod I without device                Objective measurements completed on examination: See above findings.      [facility] Adult PT Treatment/Exercise - 10/08/19 0001      Exercises   Exercises  Knee/Hip      Knee/Hip Exercises: Stretches   Active Hamstring Stretch  Right;2 reps;30 seconds    Quad Stretch  Right;2 reps;30 seconds    Quad Stretch Limitations  supine with strap, leg off EOB    Other Knee/Hip Stretches  heelslides AAROM 10 sec X 5      Knee/Hip Exercises: Seated   Long Arc Quad  Right;10 reps      Knee/Hip Exercises: Supine   Quad Sets  Right;10 reps    Straight Leg Raises  Right;10 reps      Modalities   Modalities  Cryotherapy;Electrical Stimulation      Cryotherapy   Number Minutes Cryotherapy  10 Minutes    Cryotherapy Location  Knee    Type of Cryotherapy  Ice pack      Electrical Stimulation   Electrical Stimulation Location  Rt knee    Electrical Stimulation Action  IFC    Electrical Stimulation Parameters  tolerance    Electrical Stimulation Goals  Pain      Manual Therapy   Manual therapy  comments  Rt knee PROM gentle into felxion and extension             PT Education - 10/08/19 2340    Education Details  HEP, POC,TENS    Person(s) Educated  Patient    Methods  Explanation;Demonstration;Verbal cues;Handout    Comprehension  Verbalized understanding;Returned demonstration;Need further instruction          PT Long Term Goals - 10/08/19 2347      PT LONG TERM GOAL #1   Title  independent with HEP and how to progress herself. (Target for all goals 6 weeks 11/19/19)    Time  6    Period  Weeks    Status  New      PT LONG TERM GOAL #2   Title  reduce Rt knee pain to overall less than 3-4/10 with ususal activity    Time  6    Period  Weeks    Status  New      PT LONG TERM GOAL #3   Title  Improve Rt knee strength to 5/5 to improve function    Time  6    Period  Weeks    Status  New      PT LONG TERM GOAL #4   Title  improve gait to WNL for community distances    Time  6    Period  Weeks    Status  New             Plan - 10/08/19 2342    Clinical Impression Statement  Pt presents with subacute pain of Rt knee, lateral meniscal tear, knee OA, and patella tendonosis on MRI. Will try conservative PT first. She has overall decreased knee ROM, decreased strength, decreased neuromuscular control, decreased activity tolerance for gait, standing activity, and for extending her knee, and increased pain limiting her funciton. She will benefit from skilled PT to address her deficits.    Personal Factors and Comorbidities  Comorbidity 1    Comorbidities  lateral meniscal tear, knee OA, and patella tendonosis    Examination-Activity Limitations  Lift;Stand;Bend;Carry;Stairs;Squat;Transfers    Examination-Participation Restrictions  Cleaning;Community Activity;Driving;Laundry;Shop    Stability/Clinical Decision Making  Evolving/Moderate complexity    Clinical Decision Making  Moderate    Rehab Potential  Good    PT Frequency  2x / week   1-2   PT Duration   6 weeks    PT Treatment/Interventions  ADLs/Self Care Home Management;Aquatic Therapy;Cryotherapy;Electrical Stimulation;Iontophoresis 4mg /ml Dexamethasone;Moist Heat;Ultrasound;Gait training;Stair training;Therapeutic activities;Therapeutic exercise;Balance training;Neuromuscular re-education;Patient/family education;Manual techniques;Passive range of motion;Dry needling;Taping;Vasopneumatic Device;Joint Manipulations    PT Next Visit Plan  review and update HEP PRN, Rt knee ROM and strength, gait, consider manual, taping, modalaties, PRN    PT Home Exercise Plan  Access Code: [email or web address]    Consulted and Agree with Plan of Care  Patient       Patient will benefit from skilled therapeutic intervention in order to improve the following deficits and impairments:  Abnormal gait, Decreased activity tolerance, Decreased balance, Decreased endurance, Decreased mobility, Decreased range of motion, Decreased strength, Difficulty walking,  Increased edema, Impaired flexibility, Increased muscle spasms, Pain  Visit Diagnosis: Acute pain of right knee  Muscle weakness (generalized)  Other abnormalities of gait and mobility  Localized edema     Problem List Patient Active Problem List   Diagnosis Date Noted  . Primary osteoarthritis of both knees 07/30/2018  . Primary osteoarthritis of both hands 07/30/2018  . Foot pain 05/15/2018  . Obstructive sleep apnea on CPAP 02/23/2017  . 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 Snoring 03/25/2015  . 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 Acute pancreatitis 10/26/2011  . Nausea and vomiting in adult 10/26/2011  . Dehydration 10/26/2011  . Gastroenteritis 10/26/2011  . Hypokalemia 10/26/2011  . Elevated liver function tests 10/26/2011  . Acid reflux   . Thyroid disease     Birdie Riddle 10/08/2019, 11:50 PM  [facility] [address] [place], [place],  [address] Phone: [phone]   Fax:  [phone]  Name: MILIA WARTH MRN: [id number] Date of Birth: 04-13-1956

## 2019-10-08 NOTE — Patient Instructions (Signed)
Access Code: [email or web address]  URL: [email or web address]  Date: 10/08/2019  Prepared by: Ivery Quale   Exercises  Supine Hamstring Stretch with Strap - 3 reps - 1 sets - 30 hold - 2x daily - 6x weekly  Supine Quadriceps Stretch with Strap on Table - 2 sets - 30 hold - 2x daily - 6x weekly  Supine Bridge - 10 reps - 1-2 sets - 5 hold - 2x daily - 6x weekly  Supine Quad Set - 10 reps - 2 sets - 5 hold - 2x daily - 6x weekly  Supine Active Straight Leg Raise - 10 reps - 1-2 sets - 2x daily - 6x weekly  Seated Long Arc Quad - 10 reps - 2-3 sets - 2x daily - 6x weekly  Mini Squat with Counter Support - 10 reps - 1-2 sets - 2x daily - 6x weekly  Patient Education  TENS Therapy

## 2019-10-10 ENCOUNTER — Encounter: Payer: Self-pay | Admitting: Rheumatology

## 2019-10-10 ENCOUNTER — Other Ambulatory Visit: Payer: Self-pay

## 2019-10-10 ENCOUNTER — Ambulatory Visit (INDEPENDENT_AMBULATORY_CARE_PROVIDER_SITE_OTHER): Payer: Managed Care, Other (non HMO) | Admitting: Rheumatology

## 2019-10-10 VITALS — BP 123/77 | HR 78 | Resp 16 | Ht 66.0 in | Wt 197.6 lb

## 2019-10-10 DIAGNOSIS — R7689 Other specified abnormal immunological findings in serum: Secondary | ICD-10-CM

## 2019-10-10 DIAGNOSIS — M19041 Primary osteoarthritis, right hand: Secondary | ICD-10-CM | POA: Diagnosis not present

## 2019-10-10 DIAGNOSIS — Z79899 Other long term (current) drug therapy: Secondary | ICD-10-CM

## 2019-10-10 DIAGNOSIS — R5383 Other fatigue: Secondary | ICD-10-CM

## 2019-10-10 DIAGNOSIS — R768 Other specified abnormal immunological findings in serum: Secondary | ICD-10-CM | POA: Diagnosis not present

## 2019-10-10 DIAGNOSIS — M170 Bilateral primary osteoarthritis of knee: Secondary | ICD-10-CM

## 2019-10-10 DIAGNOSIS — M19042 Primary osteoarthritis, left hand: Secondary | ICD-10-CM

## 2019-10-10 DIAGNOSIS — M3509 Sicca syndrome with other organ involvement: Secondary | ICD-10-CM

## 2019-10-10 DIAGNOSIS — Z8639 Personal history of other endocrine, nutritional and metabolic disease: Secondary | ICD-10-CM

## 2019-10-10 DIAGNOSIS — Z8669 Personal history of other diseases of the nervous system and sense organs: Secondary | ICD-10-CM

## 2019-10-11 ENCOUNTER — Ambulatory Visit: Payer: Managed Care, Other (non HMO) | Admitting: Rheumatology

## 2019-10-11 LAB — COMPLETE METABOLIC PANEL WITH GFR
AG Ratio: 1.9 (calc) (ref 1.0–2.5)
ALT: 16 U/L (ref 6–29)
AST: 16 U/L (ref 10–35)
Albumin: 4.9 g/dL (ref 3.6–5.1)
Alkaline phosphatase (APISO): 74 U/L (ref 37–153)
BUN: 12 mg/dL (ref 7–25)
CO2: 27 mmol/L (ref 20–32)
Calcium: 10.3 mg/dL (ref 8.6–10.4)
Chloride: 106 mmol/L (ref 98–110)
Creat: 0.82 mg/dL (ref 0.50–0.99)
GFR, Est African American: 89 mL/min/{1.73_m2} (ref >=60)
GFR, Est Non African American: 77 mL/min/{1.73_m2} (ref >=60)
Globulin: 2.6 g/dL (calc) (ref 1.9–3.7)
Glucose, Bld: 91 mg/dL (ref 65–99)
Potassium: 4.1 mmol/L (ref 3.5–5.3)
Sodium: 142 mmol/L (ref 135–146)
Total Bilirubin: 1.1 mg/dL (ref 0.2–1.2)
Total Protein: 7.5 g/dL (ref 6.1–8.1)

## 2019-10-11 LAB — CBC WITH DIFFERENTIAL/PLATELET
Absolute Monocytes: 387 cells/uL (ref 200–950)
Basophils Absolute: 32 cells/uL (ref 0–200)
Basophils Relative: 0.6 %
Eosinophils Absolute: 138 cells/uL (ref 15–500)
Eosinophils Relative: 2.6 %
HCT: 42.5 % (ref 35.0–45.0)
Hemoglobin: 14.2 g/dL (ref 11.7–15.5)
Lymphs Abs: 1590 cells/uL (ref 850–3900)
MCH: 28.3 pg (ref 27.0–33.0)
MCHC: 33.4 g/dL (ref 32.0–36.0)
MCV: 84.7 fL (ref 80.0–100.0)
MPV: 10.3 fL (ref 7.5–12.5)
Monocytes Relative: 7.3 %
Neutro Abs: 3154 cells/uL (ref 1500–7800)
Neutrophils Relative %: 59.5 %
Platelets: 244 10*3/uL (ref 140–400)
RBC: 5.02 10*6/uL (ref 3.80–5.10)
RDW: 13.1 % (ref 11.0–15.0)
Total Lymphocyte: 30 %
WBC: 5.3 10*3/uL (ref 3.8–10.8)

## 2019-10-11 NOTE — Progress Notes (Signed)
CBC and CMP are normal.

## 2019-10-14 ENCOUNTER — Encounter: Payer: Managed Care, Other (non HMO) | Admitting: Rehabilitative and Restorative Service Providers"

## 2019-10-16 ENCOUNTER — Encounter: Payer: Self-pay | Admitting: Rehabilitative and Restorative Service Providers"

## 2019-10-16 ENCOUNTER — Ambulatory Visit (INDEPENDENT_AMBULATORY_CARE_PROVIDER_SITE_OTHER): Payer: Managed Care, Other (non HMO) | Admitting: Rehabilitative and Restorative Service Providers"

## 2019-10-16 ENCOUNTER — Other Ambulatory Visit: Payer: Self-pay

## 2019-10-16 DIAGNOSIS — R2689 Other abnormalities of gait and mobility: Secondary | ICD-10-CM | POA: Diagnosis not present

## 2019-10-16 DIAGNOSIS — R600 Localized edema: Secondary | ICD-10-CM

## 2019-10-16 DIAGNOSIS — M6281 Muscle weakness (generalized): Secondary | ICD-10-CM

## 2019-10-16 DIAGNOSIS — M25561 Pain in right knee: Secondary | ICD-10-CM | POA: Diagnosis not present

## 2019-10-16 NOTE — Therapy (Signed)
[facility] [address] [place], [place], [address] Phone: [phone]   Fax:  [phone]  Physical Therapy Treatment  Patient Details  Name: [name] [name] MRN: [id number] Date of Birth: [birth year] Referring Provider (PT): Eldred Manges, MD   Encounter Date: 10/16/2019  PT End of Session - 10/16/19 1149    Visit Number  2    Number of Visits  12    Date for PT Re-Evaluation  11/19/19    Authorization Type  cigna managed    PT Start Time  1103    PT Stop Time  1201    PT Time Calculation (min)  58 min    Activity Tolerance  Patient tolerated treatment well    Behavior During Therapy  [facility] for tasks assessed/performed       Past Medical History:  Diagnosis Date  . Abnormal liver enzymes   . Acid reflux   . Arthritis    back and neck  . Bloating   . Bronchitis   . Chest pain, unspecified   . Diarrhea   . Dyspepsia   . Esophageal reflux   . H/O acute pancreatitis   . H/O hiatal hernia   . Headache(784.0)    migraines  . Hemorrhage of rectum and anus   . Hypercholesteremia   . Nausea   . Sjogren's disease (HCC)   . Spinal stenosis, cervical region   . Thyroid disease   . Thyroid nodule     Past Surgical History:  Procedure Laterality Date  . ABDOMINAL HYSTERECTOMY  2007   total with BSO  . CHOLECYSTECTOMY    . COLONOSCOPY    . ECTOPIC PREGNANCY SURGERY    . ESOPHAGOGASTRODUODENOSCOPY    . EUS  12/21/2011   Procedure: ESOPHAGEAL ENDOSCOPIC ULTRASOUND (EUS) RADIAL;  Surgeon: Willis Modena, MD;  Location: [facility];  Service: Endoscopy;  Laterality: N/A;  . eye cautery    . EYE SURGERY     lasik  . EYE SURGERY Left    cataract extraction     There were no vitals filed for this visit.  Subjective Assessment - 10/16/19 1118    Subjective  Pt. indicated feeling mild improvements in presentation since last visit.  Some tightness complaints c HEP at times.    Pertinent History  cervical stenosis    Limitations   Sitting;Lifting;Standing;Walking;House hold activities    How long can you sit comfortably?  does not hurt while sitting but gets tight then hurts when she stands up    How long can you stand comfortably?  knee starts swelling after a couple hours    How long can you walk comfortably?  depends    Diagnostic tests  lateral meniscal tear, knee OA, and patella tendonosis on MRI.    Patient Stated Goals  get the pain down, get back to walking normal    Currently in Pain?  Yes    Pain Location  Knee    Pain Orientation  Right    Pain Type  Acute pain    Pain Onset  More than a month ago                       [facility] Adult PT Treatment/Exercise - 10/16/19 0001      Knee/Hip Exercises: Stretches   Active Hamstring Stretch  Right;Other (comment)   2 x 10 5 second hold      Knee/Hip Exercises: Aerobic   Nustep  L6 10 mins  Knee/Hip Exercises: Standing   Lateral Step Up  1 set;Right   4 inch step     Knee/Hip Exercises: Seated   Long Arc Quad  Strengthening;Right;3 sets;15 reps    Long Arc Quad Weight  7 lbs.      Knee/Hip Exercises: Supine   Heel Slides  AROM;Strengthening;Right;2 sets;10 reps    Heel Prop for Knee Extension  1 minute   RLE   Straight Leg Raises  Right;2 sets;10 reps      Modalities   Modalities  Vasopneumatic      Vasopneumatic   Number Minutes Vasopneumatic   10 minutes    Vasopnuematic Location   Knee    Vasopneumatic Pressure  Medium    Vasopneumatic Temperature   34 degrees      Manual Therapy   Manual Therapy  Joint mobilization    Joint Mobilization  Anterior Rt tibia g2-g3 mobilizations, PROM             PT Education - 10/16/19 1130    Education Details  HEP review, cues for intervention techniques.    Person(s) Educated  Patient    Methods  Explanation;Verbal cues    Comprehension  Verbalized understanding;Returned demonstration          PT Long Term Goals - 10/08/19 2347      PT LONG TERM GOAL #1   Title   independent with HEP and how to progress herself. (Target for all goals 6 weeks 11/19/19)    Time  6    Period  Weeks    Status  New      PT LONG TERM GOAL #2   Title  reduce Rt knee pain to overall less than 3-4/10 with ususal activity    Time  6    Period  Weeks    Status  New      PT LONG TERM GOAL #3   Title  Improve Rt knee strength to 5/5 to improve function    Time  6    Period  Weeks    Status  New      PT LONG TERM GOAL #4   Title  improve gait to WNL for community distances    Time  6    Period  Weeks    Status  New            Plan - 10/16/19 1131    Clinical Impression Statement  Mild impairment noted in full knee extension today passively with mild discomfort noted.  Overall presentation today showed progression in tolerance to activty and strengthening intervention.   Continued skilled PT services indicated at this time.    Personal Factors and Comorbidities  Comorbidity 1    Comorbidities  lateral meniscal tear, knee OA, and patella tendonosis    Examination-Activity Limitations  Lift;Stand;Bend;Carry;Stairs;Squat;Transfers    Examination-Participation Restrictions  Cleaning;Community Activity;Driving;Laundry;Shop    Stability/Clinical Decision Making  Evolving/Moderate complexity    Rehab Potential  Good    PT Frequency  2x / week   1-2   PT Duration  6 weeks    PT Treatment/Interventions  ADLs/Self Care Home Management;Aquatic Therapy;Cryotherapy;Electrical Stimulation;Iontophoresis 4mg /ml Dexamethasone;Moist Heat;Ultrasound;Gait training;Stair training;Therapeutic activities;Therapeutic exercise;Balance training;Neuromuscular re-education;Patient/family education;Manual techniques;Passive range of motion;Dry needling;Taping;Vasopneumatic Device;Joint Manipulations    PT Next Visit Plan  Continue to improve mobility and strength    PT Home Exercise Plan  Access Code: [email or web address]    Consulted and Agree with Plan of Care  Patient       Patient  will benefit  from skilled therapeutic intervention in order to improve the following deficits and impairments:  Abnormal gait, Decreased activity tolerance, Decreased balance, Decreased endurance, Decreased mobility, Decreased range of motion, Decreased strength, Difficulty walking, Increased edema, Impaired flexibility, Increased muscle spasms, Pain  Visit Diagnosis: Acute pain of right knee  Muscle weakness (generalized)  Other abnormalities of gait and mobility  Localized edema     Problem List Patient Active Problem List   Diagnosis Date Noted  . Primary osteoarthritis of both knees 07/30/2018  . Primary osteoarthritis of both hands 07/30/2018  . Foot pain 05/15/2018  . Obstructive sleep apnea on CPAP 02/23/2017  . 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 Snoring 03/25/2015  . 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 Acute pancreatitis 10/26/2011  . Nausea and vomiting in adult 10/26/2011  . Dehydration 10/26/2011  . Gastroenteritis 10/26/2011  . Hypokalemia 10/26/2011  . Elevated liver function tests 10/26/2011  . Acid reflux   . Thyroid disease     Chyrel Masson, PT, DPT, OCS, ATC 10/16/19  12:03 PM     [facility] Physical Therapy [address] [place], [place], [address] Phone: [phone]   Fax:  [phone]  Name: JIMMI SIDENER MRN: [id number] Date of Birth: 19-Feb-1956

## 2019-10-22 ENCOUNTER — Other Ambulatory Visit: Payer: Self-pay

## 2019-10-22 ENCOUNTER — Ambulatory Visit (INDEPENDENT_AMBULATORY_CARE_PROVIDER_SITE_OTHER): Payer: Managed Care, Other (non HMO) | Admitting: Physical Therapy

## 2019-10-22 DIAGNOSIS — R2689 Other abnormalities of gait and mobility: Secondary | ICD-10-CM

## 2019-10-22 DIAGNOSIS — M25561 Pain in right knee: Secondary | ICD-10-CM

## 2019-10-22 DIAGNOSIS — M6281 Muscle weakness (generalized): Secondary | ICD-10-CM

## 2019-10-22 DIAGNOSIS — R600 Localized edema: Secondary | ICD-10-CM

## 2019-10-22 NOTE — Therapy (Signed)
[facility] [address] [place], [place], [address] Phone: [phone]   Fax:  [phone]  Physical Therapy Treatment  Patient Details  Name: [name] [name] MRN: [id number] Date of Birth: Jun 11, 1956 Referring Provider (PT): Marybelle Killings, MD   Encounter Date: 10/22/2019  PT End of Session - 10/22/19 1134    Visit Number  3    Number of Visits  12    Date for PT Re-Evaluation  11/19/19    Authorization Type  cigna managed    PT Start Time  1103    PT Stop Time  1145    PT Time Calculation (min)  42 min    Activity Tolerance  Patient tolerated treatment well    Behavior During Therapy  [facility] for tasks assessed/performed       Past Medical History:  Diagnosis Date  . Abnormal liver enzymes   . Acid reflux   . Arthritis    back and neck  . Bloating   . Bronchitis   . Chest pain, unspecified   . Diarrhea   . Dyspepsia   . Esophageal reflux   . H/O acute pancreatitis   . H/O hiatal hernia   . Headache(784.0)    migraines  . Hemorrhage of rectum and anus   . Hypercholesteremia   . Nausea   . Sjogren's disease ([place])   . Spinal stenosis, cervical region   . Thyroid disease   . Thyroid nodule     Past Surgical History:  Procedure Laterality Date  . ABDOMINAL HYSTERECTOMY  2007   total with BSO  . CHOLECYSTECTOMY    . COLONOSCOPY    . ECTOPIC PREGNANCY SURGERY    . ESOPHAGOGASTRODUODENOSCOPY    . EUS  12/21/2011   Procedure: ESOPHAGEAL ENDOSCOPIC ULTRASOUND (EUS) RADIAL;  Surgeon: Arta Silence, MD;  Location: [facility];  Service: Endoscopy;  Laterality: N/A;  . eye cautery    . EYE SURGERY     lasik  . EYE SURGERY Left    cataract extraction     There were no vitals filed for this visit.  Subjective Assessment - 10/22/19 1134    Subjective  Relays improvements in her Rt knee, only 2/10 pain today    Pertinent History  cervical stenosis    Limitations  Sitting;Lifting;Standing;Walking;House hold activities    How  long can you sit comfortably?  does not hurt while sitting but gets tight then hurts when she stands up    How long can you stand comfortably?  knee starts swelling after a couple hours    How long can you walk comfortably?  depends    Diagnostic tests  lateral meniscal tear, knee OA, and patella tendonosis on MRI.    Patient Stated Goals  get the pain down, get back to walking normal    Pain Onset  More than a month ago                       [facility] Adult PT Treatment/Exercise - 10/22/19 0001      Knee/Hip Exercises: Stretches   Active Hamstring Stretch  --    Passive Hamstring Stretch  Right;3 reps;30 seconds    Quad Stretch  Right;2 reps;30 seconds    Quad Stretch Limitations  supine with strap, leg off EOB    Gastroc Stretch Limitations  slanboard 3X30 sec      Knee/Hip Exercises: Aerobic   Nustep  L6  X6 min  Knee/Hip Exercises: Machines for Strengthening   Total Gym Leg Press  75 lbs for bilat leg push 2X15, then 50 lbs for Rt leg push 2X10      Knee/Hip Exercises: Standing   Terminal Knee Extension  Right;15 reps    Theraband Level (Terminal Knee Extension)  Level 3 (Green)      Knee/Hip Exercises: Seated   Long Animator sets;15 reps    Long Arc Quad Weight  5 lbs.    Ball Squeeze  5 sec X 15 reps    Hamstring Curl  Right;15 reps    Hamstring Limitations  L3    Sit to [employer]  10 reps      Knee/Hip Exercises: Supine   Bridges  10 reps    Straight Leg Raises  Right;2 sets;10 reps    Other Supine Knee/Hip Exercises  clam L3 X 20 reps      Manual Therapy   Manual therapy comments  Rt knee PROM flexion and extension, extension mobs grade 2-3                  PT Long Term Goals - 10/08/19 2347      PT LONG TERM GOAL #1   Title  independent with HEP and how to progress herself. (Target for all goals 6 weeks 11/19/19)    Time  6    Period  Weeks    Status  New      PT LONG TERM GOAL #2   Title  reduce Rt knee pain to  overall less than 3-4/10 with ususal activity    Time  6    Period  Weeks    Status  New      PT LONG TERM GOAL #3   Title  Improve Rt knee strength to 5/5 to improve function    Time  6    Period  Weeks    Status  New      PT LONG TERM GOAL #4   Title  improve gait to WNL for community distances    Time  6    Period  Weeks    Status  New            Plan - 10/22/19 1135    Clinical Impression Statement  Continued with Rt LE strengthening and knee ROM/stretching today with good toleraance without complaints. She appears to be progressing well with PT but is still laking terminal knee extension on Rt. PT will continue to progress this as able.    Personal Factors and Comorbidities  Comorbidity 1    Comorbidities  lateral meniscal tear, knee OA, and patella tendonosis    Examination-Activity Limitations  Lift;Stand;Bend;Carry;Stairs;Squat;Transfers    Examination-Participation Restrictions  Cleaning;Community Activity;Driving;Laundry;Shop    Stability/Clinical Decision Making  Evolving/Moderate complexity    Rehab Potential  Good    PT Frequency  2x / week   1-2   PT Duration  6 weeks    PT Treatment/Interventions  ADLs/Self Care Home Management;Aquatic Therapy;Cryotherapy;Electrical Stimulation;Iontophoresis 4mg /ml Dexamethasone;Moist Heat;Ultrasound;Gait training;Stair training;Therapeutic activities;Therapeutic exercise;Balance training;Neuromuscular re-education;Patient/family education;Manual techniques;Passive range of motion;Dry needling;Taping;Vasopneumatic Device;Joint Manipulations    PT Next Visit Plan  Continue to improve mobility and strength    PT Home Exercise Plan  Access Code:    Consulted and Agree with Plan of Care  Patient       Patient will benefit from skilled therapeutic intervention in order to improve the following deficits and impairments:  Abnormal gait, Decreased activity  tolerance, Decreased balance, Decreased endurance, Decreased mobility,  Decreased range of motion, Decreased strength, Difficulty walking, Increased edema, Impaired flexibility, Increased muscle spasms, Pain  Visit Diagnosis: Acute pain of right knee  Muscle weakness (generalized)  Other abnormalities of gait and mobility  Localized edema     Problem List Patient Active Problem List   Diagnosis Date Noted  . Primary osteoarthritis of both knees 07/30/2018  . Primary osteoarthritis of both hands 07/30/2018  . Foot pain 05/15/2018  . Obstructive sleep apnea on CPAP 02/23/2017  . 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 Snoring 03/25/2015  . 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 Acute pancreatitis 10/26/2011  . Nausea and vomiting in adult 10/26/2011  . Dehydration 10/26/2011  . Gastroenteritis 10/26/2011  . Hypokalemia 10/26/2011  . Elevated liver function tests 10/26/2011  . Acid reflux   . Thyroid disease     Birdie Riddle 10/22/2019, 12:19 PM  [facility] Physical Therapy [address] [place], [place], [address] Phone: [phone]   Fax:  [phone]  Name: SHAILA GILCHREST MRN: [id number] Date of Birth: 1955/12/22

## 2019-10-24 ENCOUNTER — Encounter: Payer: Managed Care, Other (non HMO) | Admitting: Physical Therapy

## 2019-10-29 ENCOUNTER — Encounter: Payer: Managed Care, Other (non HMO) | Admitting: Physical Therapy

## 2019-10-29 ENCOUNTER — Other Ambulatory Visit: Payer: Self-pay

## 2019-10-29 ENCOUNTER — Ambulatory Visit (INDEPENDENT_AMBULATORY_CARE_PROVIDER_SITE_OTHER): Payer: Managed Care, Other (non HMO) | Admitting: Physical Therapy

## 2019-10-29 DIAGNOSIS — M6281 Muscle weakness (generalized): Secondary | ICD-10-CM

## 2019-10-29 DIAGNOSIS — R600 Localized edema: Secondary | ICD-10-CM

## 2019-10-29 DIAGNOSIS — R2689 Other abnormalities of gait and mobility: Secondary | ICD-10-CM

## 2019-10-29 DIAGNOSIS — M25561 Pain in right knee: Secondary | ICD-10-CM

## 2019-10-29 NOTE — Therapy (Signed)
[facility] [address] [place], [place], [address] Phone: [phone]   Fax:  [phone]  Physical Therapy Treatment  Patient Details  Name: [name] [name] MRN: [id number] Date of Birth: 1956-03-23 Referring Provider (PT): Marybelle Killings, MD   Encounter Date: 10/29/2019  PT End of Session - 10/29/19 1132    Visit Number  4    Number of Visits  12    Date for PT Re-Evaluation  11/19/19    Authorization Type  cigna managed    PT Start Time  1102    PT Stop Time  1155    PT Time Calculation (min)  53 min    Activity Tolerance  Patient tolerated treatment well    Behavior During Therapy  [facility] for tasks assessed/performed       Past Medical History:  Diagnosis Date  . Abnormal liver enzymes   . Acid reflux   . Arthritis    back and neck  . Bloating   . Bronchitis   . Chest pain, unspecified   . Diarrhea   . Dyspepsia   . Esophageal reflux   . H/O acute pancreatitis   . H/O hiatal hernia   . Headache(784.0)    migraines  . Hemorrhage of rectum and anus   . Hypercholesteremia   . Nausea   . Sjogren's disease ([place])   . Spinal stenosis, cervical region   . Thyroid disease   . Thyroid nodule     Past Surgical History:  Procedure Laterality Date  . ABDOMINAL HYSTERECTOMY  2007   total with BSO  . CHOLECYSTECTOMY    . COLONOSCOPY    . ECTOPIC PREGNANCY SURGERY    . ESOPHAGOGASTRODUODENOSCOPY    . EUS  12/21/2011   Procedure: ESOPHAGEAL ENDOSCOPIC ULTRASOUND (EUS) RADIAL;  Surgeon: Arta Silence, MD;  Location: [facility];  Service: Endoscopy;  Laterality: N/A;  . eye cautery    . EYE SURGERY     lasik  . EYE SURGERY Left    cataract extraction     There were no vitals filed for this visit.  Subjective Assessment - 10/29/19 1110    Subjective  Relays improvements in her Rt knee, only 1-2/10 pain today    Pertinent History  cervical stenosis    Limitations  Sitting;Lifting;Standing;Walking;House hold activities     How long can you sit comfortably?  does not hurt while sitting but gets tight then hurts when she stands up    How long can you stand comfortably?  knee starts swelling after a couple hours    How long can you walk comfortably?  depends    Diagnostic tests  lateral meniscal tear, knee OA, and patella tendonosis on MRI.    Patient Stated Goals  get the pain down, get back to walking normal    Pain Onset  More than a month ago         [facility] Adult PT Treatment/Exercise - 10/29/19 0001      Knee/Hip Exercises: Stretches   Passive Hamstring Stretch  Right;3 reps;30 seconds    Quad Stretch  Right;30 seconds;3 reps    Sports administrator Limitations  supine with strap, leg off EOB    Gastroc Stretch Limitations  slanboard 3X30 sec      Knee/Hip Exercises: Aerobic   Nustep  L7  X7 min      Knee/Hip Exercises: Machines for Strengthening   Total Gym Leg Press  75 lbs for bilat leg push 2X15, then 50 lbs  for Rt leg push 2X10      Knee/Hip Exercises: Standing   Terminal Knee Extension  Right;20 reps    Theraband Level (Terminal Knee Extension)  Level 3 (Green)      Knee/Hip Exercises: Seated   Long Animator sets;15 reps    Long Arc Quad Weight  5 lbs.    Ball Squeeze  --    Hamstring Curl  Right;15 reps    Hamstring Limitations  L3    Sit to [employer]  --      Knee/Hip Exercises: Supine   Quad Sets  Right;15 reps    Quad Sets Limitations  hold 5 sec with heel prop    Bridges  15 reps    Bridges Limitations  with ball sq    Straight Leg Raises  Right;2 sets;10 reps    Straight Leg Raises Limitations  2 lbs    Other Supine Knee/Hip Exercises  clam L3 X 20 reps      Cryotherapy   Number Minutes Cryotherapy  10 Minutes    Cryotherapy Location  Knee    Type of Cryotherapy  Ice pack      Electrical Stimulation   Electrical Stimulation Location  Rt knee    Electrical Stimulation Action  IFC    Electrical Stimulation Parameters  tolerance    Electrical Stimulation Goals  Pain       Manual Therapy   Manual therapy comments  Rt knee PROM flexion and extension, extension mobs grade 2-3                  PT Long Term Goals - 10/08/19 2347      PT LONG TERM GOAL #1   Title  independent with HEP and how to progress herself. (Target for all goals 6 weeks 11/19/19)    Time  6    Period  Weeks    Status  New      PT LONG TERM GOAL #2   Title  reduce Rt knee pain to overall less than 3-4/10 with ususal activity    Time  6    Period  Weeks    Status  New      PT LONG TERM GOAL #3   Title  Improve Rt knee strength to 5/5 to improve function    Time  6    Period  Weeks    Status  New      PT LONG TERM GOAL #4   Title  improve gait to WNL for community distances    Time  6    Period  Weeks    Status  New            Plan - 10/29/19 1132    Clinical Impression Statement  She was able to progress her ROM and strength today without complaints during session. She did request to have TENS post tx for pain/sorenss. Continued to focus on quad strength and terminal knee extension for her Rt knee. Continue POC    Personal Factors and Comorbidities  Comorbidity 1    Comorbidities  lateral meniscal tear, knee OA, and patella tendonosis    Examination-Activity Limitations  Lift;Stand;Bend;Carry;Stairs;Squat;Transfers    Examination-Participation Restrictions  Cleaning;Community Activity;Driving;Laundry;Shop    Stability/Clinical Decision Making  Evolving/Moderate complexity    Rehab Potential  Good    PT Frequency  2x / week   1-2   PT Duration  6 weeks    PT Treatment/Interventions  ADLs/Self Care Home Management;Aquatic  Therapy;Cryotherapy;Electrical Stimulation;Iontophoresis 4mg /ml Dexamethasone;Moist Heat;Ultrasound;Gait training;Stair training;Therapeutic activities;Therapeutic exercise;Balance training;Neuromuscular re-education;Patient/family education;Manual techniques;Passive range of motion;Dry needling;Taping;Vasopneumatic Device;Joint  Manipulations    PT Next Visit Plan  Continue to improve mobility and strength    PT Home Exercise Plan  Access Code:    Consulted and Agree with Plan of Care  Patient       Patient will benefit from skilled therapeutic intervention in order to improve the following deficits and impairments:  Abnormal gait, Decreased activity tolerance, Decreased balance, Decreased endurance, Decreased mobility, Decreased range of motion, Decreased strength, Difficulty walking, Increased edema, Impaired flexibility, Increased muscle spasms, Pain  Visit Diagnosis: Acute pain of right knee  Muscle weakness (generalized)  Other abnormalities of gait and mobility  Localized edema     Problem List Patient Active Problem List   Diagnosis Date Noted  . Primary osteoarthritis of both knees 07/30/2018  . Primary osteoarthritis of both hands 07/30/2018  . Foot pain 05/15/2018  . Obstructive sleep apnea on CPAP 02/23/2017  . 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 Snoring 03/25/2015  . 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 Acute pancreatitis 10/26/2011  . Nausea and vomiting in adult 10/26/2011  . Dehydration 10/26/2011  . Gastroenteritis 10/26/2011  . Hypokalemia 10/26/2011  . Elevated liver function tests 10/26/2011  . Acid reflux   . Thyroid disease     10/28/2011 10/29/2019, 11:52 AM  [facility] Physical Therapy [address] [place], [place], [place] Phone: [phone]   Fax:  [phone]  Name: [name] [name] MRN: Miki Kins Date of Birth: [birth year]

## 2019-10-31 ENCOUNTER — Other Ambulatory Visit: Payer: Self-pay

## 2019-10-31 ENCOUNTER — Ambulatory Visit (INDEPENDENT_AMBULATORY_CARE_PROVIDER_SITE_OTHER): Payer: Managed Care, Other (non HMO) | Admitting: Physical Therapy

## 2019-10-31 DIAGNOSIS — R600 Localized edema: Secondary | ICD-10-CM | POA: Diagnosis not present

## 2019-10-31 DIAGNOSIS — M6281 Muscle weakness (generalized): Secondary | ICD-10-CM

## 2019-10-31 DIAGNOSIS — M25561 Pain in right knee: Secondary | ICD-10-CM

## 2019-10-31 DIAGNOSIS — R2689 Other abnormalities of gait and mobility: Secondary | ICD-10-CM

## 2019-10-31 NOTE — Therapy (Addendum)
[facility] [address] [place], [place], [address] Phone: [phone]   Fax:  [phone]  Physical Therapy Treatment/Discharge Note  Patient Details  Name: MAICIE VANDERLOOP MRN: [id number] Date of Birth: Jun 11, 1956 Referring Provider (PT): Marybelle Killings, MD   Encounter Date: 10/31/2019  PT End of Session - 10/31/19 1200    Visit Number  5    Number of Visits  12    Date for PT Re-Evaluation  11/19/19    Authorization Type  cigna managed    PT Start Time  1140    PT Stop Time  1218    PT Time Calculation (min)  38 min    Activity Tolerance  Patient tolerated treatment well    Behavior During Therapy  [facility] for tasks assessed/performed       Past Medical History:  Diagnosis Date  . Abnormal liver enzymes   . Acid reflux   . Arthritis    back and neck  . Bloating   . Bronchitis   . Chest pain, unspecified   . Diarrhea   . Dyspepsia   . Esophageal reflux   . H/O acute pancreatitis   . H/O hiatal hernia   . Headache(784.0)    migraines  . Hemorrhage of rectum and anus   . Hypercholesteremia   . Nausea   . Sjogren's disease ([place])   . Spinal stenosis, cervical region   . Thyroid disease   . Thyroid nodule     Past Surgical History:  Procedure Laterality Date  . ABDOMINAL HYSTERECTOMY  2007   total with BSO  . CHOLECYSTECTOMY    . COLONOSCOPY    . ECTOPIC PREGNANCY SURGERY    . ESOPHAGOGASTRODUODENOSCOPY    . EUS  12/21/2011   Procedure: ESOPHAGEAL ENDOSCOPIC ULTRASOUND (EUS) RADIAL;  Surgeon: Arta Silence, MD;  Location: [facility];  Service: Endoscopy;  Laterality: N/A;  . eye cautery    . EYE SURGERY     lasik  . EYE SURGERY Left    cataract extraction     There were no vitals filed for this visit.  Subjective Assessment - 10/31/19 1159    Subjective  Her right knee pain continues to improve and she was able to go for 15-20 min walk yesterday without complaints    Pertinent History  cervical stenosis     Limitations  Sitting;Lifting;Standing;Walking;House hold activities    How long can you sit comfortably?  does not hurt while sitting but gets tight then hurts when she stands up    How long can you stand comfortably?  knee starts swelling after a couple hours    How long can you walk comfortably?  depends    Diagnostic tests  lateral meniscal tear, knee OA, and patella tendonosis on MRI.    Patient Stated Goals  get the pain down, get back to walking normal    Pain Onset  More than a month ago                       [facility] Adult PT Treatment/Exercise - 10/31/19 0001      Knee/Hip Exercises: Stretches   Passive Hamstring Stretch  Right;3 reps;30 seconds    Quad Stretch  Right;30 seconds;3 reps    Sports administrator Limitations  supine with strap, leg off EOB      Knee/Hip Exercises: Aerobic   Nustep  L7  X8 min      Knee/Hip Exercises: Machines for Strengthening  Total Gym Leg Press  81 lbs for bilat leg push 2X15, then 56 lbs for Rt leg push 2X10      Knee/Hip Exercises: Standing   Knee Flexion  Both;20 reps    Knee Flexion Limitations  5 lbs    Terminal Knee Extension  Right;20 reps    Theraband Level (Terminal Knee Extension)  Level 3 (Green)    Hip Abduction  Both;20 reps    Abduction Limitations  5 lbs      Knee/Hip Exercises: Seated   Long Arc Quad  Strengthening;Both;20 reps    Long Arc Quad Weight  5 lbs.    Hamstring Curl  Right;15 reps    Hamstring Limitations  L3      Knee/Hip Exercises: Supine   Bridges  15 reps    Bridges Limitations  with ball sq      Modalities   Modalities  --   pt denies needing modalaties today     Cryotherapy   Cryotherapy Location  --      Acupuncturist Location  --    Electrical Stimulation Goals  --      Manual Therapy   Manual therapy comments  Rt knee PROM flexion and extension, extension mobs grade 2-3                  PT Long Term Goals - 10/31/19 1221      PT  LONG TERM GOAL #1   Title  independent with HEP and how to progress herself. (Target for all goals 6 weeks 11/19/19)    Time  6    Period  Weeks    Status  On-going      PT LONG TERM GOAL #2   Title  reduce Rt knee pain to overall less than 3-4/10 with ususal activity    Time  6    Period  Weeks    Status  Achieved      PT LONG TERM GOAL #3   Title  Improve Rt knee strength to 5/5 to improve function    Time  6    Period  Weeks    Status  Achieved      PT LONG TERM GOAL #4   Title  improve gait to WNL for community distances    Time  6    Period  Weeks    Status  Achieved            Plan - 10/31/19 1220    Clinical Impression Statement  She is doing well in terms of knee pain and strenth. She is not having much complaints about her knee anymore. She wants to hold PT for now as she is doing good and will trial her HEP. She will return to PT later if needed.    Personal Factors and Comorbidities  Comorbidity 1    Comorbidities  lateral meniscal tear, knee OA, and patella tendonosis    Examination-Activity Limitations  Lift;Stand;Bend;Carry;Stairs;Squat;Transfers    Examination-Participation Restrictions  Cleaning;Community Activity;Driving;Laundry;Shop    Stability/Clinical Decision Making  Evolving/Moderate complexity    Rehab Potential  Good    PT Frequency  2x / week   1-2   PT Duration  6 weeks    PT Treatment/Interventions  ADLs/Self Care Home Management;Aquatic Therapy;Cryotherapy;Electrical Stimulation;Iontophoresis 12m/ml Dexamethasone;Moist Heat;Ultrasound;Gait training;Stair training;Therapeutic activities;Therapeutic exercise;Balance training;Neuromuscular re-education;Patient/family education;Manual techniques;Passive range of motion;Dry needling;Taping;Vasopneumatic Device;Joint Manipulations    PT Next Visit Plan  hold PT for a few weeks to  trial how she does on her own    PT Home Exercise Plan  Access Code: [email or web address]    Consulted and Agree with Plan of Care   Patient       Patient will benefit from skilled therapeutic intervention in order to improve the following deficits and impairments:  Abnormal gait, Decreased activity tolerance, Decreased balance, Decreased endurance, Decreased mobility, Decreased range of motion, Decreased strength, Difficulty walking, Increased edema, Impaired flexibility, Increased muscle spasms, Pain  Visit Diagnosis: Acute pain of right knee  Muscle weakness (generalized)  Other abnormalities of gait and mobility  Localized edema     Problem List Patient Active Problem List   Diagnosis Date Noted  . Primary osteoarthritis of both knees 07/30/2018  . Primary osteoarthritis of both hands 07/30/2018  . Foot pain 05/15/2018  . Obstructive sleep apnea on CPAP 02/23/2017  . 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 Snoring 03/25/2015  . 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 Acute pancreatitis 10/26/2011  . Nausea and vomiting in adult 10/26/2011  . Dehydration 10/26/2011  . Gastroenteritis 10/26/2011  . Hypokalemia 10/26/2011  . Elevated liver function tests 10/26/2011  . Acid reflux   . Thyroid disease     PHYSICAL THERAPY DISCHARGE SUMMARY  Visits from Start of Care: 5  Current functional level related to goals / functional outcomes: See objective data   Remaining deficits: See objective data   Education / Equipment: HEP Plan: Patient agrees to discharge.  Patient goals were partially met. Patient is being discharged due to not returning since the last visit.  ?????    Scot Jun, PT, DPT, OCS, ATC 01/28/20  4:32 PM     [facility] [address] [place], [place], [address] Phone: [phone]   Fax:  [phone]  Name: BENAY POMEROY MRN: [id number] Date of Birth: [birth year]

## 2019-11-11 ENCOUNTER — Other Ambulatory Visit: Payer: Self-pay

## 2019-11-11 ENCOUNTER — Ambulatory Visit
Admission: RE | Admit: 2019-11-11 | Discharge: 2019-11-11 | Disposition: A | Discharge status: Home / Self Care | Payer: Managed Care, Other (non HMO) | Source: Ambulatory Visit | Attending: Obstetrics and Gynecology | Admitting: Obstetrics and Gynecology

## 2019-11-11 DIAGNOSIS — Z1231 Encounter for screening mammogram for malignant neoplasm of breast: Secondary | ICD-10-CM

## 2019-11-12 ENCOUNTER — Ambulatory Visit: Payer: Managed Care, Other (non HMO) | Admitting: Orthopaedic Surgery

## 2020-01-14 ENCOUNTER — Other Ambulatory Visit: Payer: Self-pay | Admitting: Obstetrics and Gynecology

## 2020-01-14 DIAGNOSIS — R109 Unspecified abdominal pain: Secondary | ICD-10-CM

## 2020-01-15 ENCOUNTER — Ambulatory Visit
Admission: RE | Admit: 2020-01-15 | Discharge: 2020-01-15 | Disposition: A | Discharge status: Home / Self Care | Payer: Managed Care, Other (non HMO) | Source: Ambulatory Visit | Attending: Obstetrics and Gynecology | Admitting: Obstetrics and Gynecology

## 2020-01-15 DIAGNOSIS — R109 Unspecified abdominal pain: Secondary | ICD-10-CM

## 2020-01-16 ENCOUNTER — Other Ambulatory Visit: Payer: Self-pay

## 2020-01-16 ENCOUNTER — Encounter: Payer: Self-pay | Admitting: Surgery

## 2020-01-16 ENCOUNTER — Ambulatory Visit (INDEPENDENT_AMBULATORY_CARE_PROVIDER_SITE_OTHER): Payer: Managed Care, Other (non HMO) | Admitting: Surgery

## 2020-01-16 VITALS — BP 125/79 | HR 77 | Ht 66.0 in | Wt 197.6 lb

## 2020-01-16 DIAGNOSIS — M67441 Ganglion, right hand: Secondary | ICD-10-CM | POA: Diagnosis not present

## 2020-01-16 DIAGNOSIS — M1711 Unilateral primary osteoarthritis, right knee: Secondary | ICD-10-CM | POA: Diagnosis not present

## 2020-01-16 NOTE — Progress Notes (Signed)
Office Visit Note   Patient: [name] [name]           Date of Birth: October 25, 1955           MRN: [id number] Visit Date: 01/16/2020              Requested by: Lanice Shirts, MD [address] [place],  [place] [address] PCP: Lanice Shirts, MD   Assessment & Plan: Visit Diagnoses:  1. Unilateral primary osteoarthritis, right knee   2. Ganglion cyst of finger of right hand             THUMB IP JOINT  Plan: Advised patient that in regards to her knee it is ultimately going to come down to needing surgical invention.  We discussed outpatient arthroscopy with debridement versus definitive treatment with total knee replacement.  I advised patient that with the amount of changes that she has in her knee that was confirmed by right knee MRI the best treatment option would likely be total knee replacement.  Advised her that with outpatient arthroscopy we cannot guarantee her that this would be of any long-term benefit.  She voices understanding.  I recommended to her that she come in with Dr. Lorin Mercy in 3 weeks to again discuss this.  In regards to the right thumb IP joint ganglion cyst I will have her follow-up with Dr. Junius Roads in 1 week to discuss possible ultrasound guided aspiration/injection.  I did not get x-rays of her thumb today.  All questions answered.  Follow-Up Instructions: Return for Dr. Junius Roads 1 week for right thumb aspiration/injection Dr. Lorin Mercy 3 weeks to discuss knee surgery.   Orders:  No orders of the defined types were placed in this encounter.  No orders of the defined types were placed in this encounter.     Procedures: No procedures performed   Clinical Data: No additional findings.   Subjective: Chief Complaint  Patient presents with  . Right Knee - Pain    HPI 64 year old white female returns for recheck of her right knee.  Patient has known history of tricompartmental DJD and lateral meniscal tear.  She has gone through formal PT and  feels like this helps some possibly.  She continues to have ongoing pain and swelling in the knee.  Dr. Lorin Mercy as previously discussed outpatient arthroscopy versus definitive treatment with total knee replacement.  She is ready to entertain the idea of having surgery.  Patient also has a question about swelling at the right thumb IP joint.  States that several years ago she had an object smashed her finger and had a fracture in this area.  Recently she is noticed swelling along the dorsal aspect of the IP joint.  Thumb gets stiff with flexion. Review of Systems No current cardiac pulmonary GI GU issues  Objective: Vital Signs: BP 125/79   Pulse 77   Ht 5\' 6"  (1.676 m)   Wt 197 lb 9.6 oz (89.6 kg)   BMI 31.89 kg/m   Physical Exam Pulmonary:     Effort: No respiratory distress.  Musculoskeletal:     Comments: Right thumb she does have a cystic mass dorsal aspect of the IP joint.  This area is mildly tender.  No signs of infection.  Gait is somewhat antalgic.  Right knee good range of motion.  Positive patellofemoral crepitus.  Medial greater than lateral joint line tenderness.  Pain with McMurray's testing.  Does have some swelling with small effusion.  Neurological:  General: No focal deficit present.     Mental Status: She is alert and oriented to person, place, and time.     Ortho Exam  Specialty Comments:  No specialty comments available.  Imaging: No results found.   PMFS History: Patient Active Problem List   Diagnosis Date Noted  . Primary osteoarthritis of both knees 07/30/2018  . Primary osteoarthritis of both hands 07/30/2018  . Foot pain 05/15/2018  . Obstructive sleep apnea on CPAP 02/23/2017  . 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 Snoring 03/25/2015  . 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 Acute pancreatitis 10/26/2011  . Nausea and vomiting in adult 10/26/2011  . Dehydration 10/26/2011  . Gastroenteritis  10/26/2011  . Hypokalemia 10/26/2011  . Elevated liver function tests 10/26/2011  . Acid reflux   . Thyroid disease    Past Medical History:  Diagnosis Date  . Abnormal liver enzymes   . Acid reflux   . Arthritis    back and neck  . Bloating   . Bronchitis   . Chest pain, unspecified   . Diarrhea   . Dyspepsia   . Esophageal reflux   . H/O acute pancreatitis   . H/O hiatal hernia   . Headache(784.0)    migraines  . Hemorrhage of rectum and anus   . Hypercholesteremia   . Nausea   . Sjogren's disease (HCC)   . Spinal stenosis, cervical region   . Thyroid disease   . Thyroid nodule     Family History  Problem Relation Age of Onset  . Colon cancer Mother   . Pancreatic cancer Brother   . Healthy Son   . Healthy Daughter     Past Surgical History:  Procedure Laterality Date  . ABDOMINAL HYSTERECTOMY  2007   total with BSO  . CHOLECYSTECTOMY    . COLONOSCOPY    . ECTOPIC PREGNANCY SURGERY    . ESOPHAGOGASTRODUODENOSCOPY    . EUS  12/21/2011   Procedure: ESOPHAGEAL ENDOSCOPIC ULTRASOUND (EUS) RADIAL;  Surgeon: Willis Modena, MD;  Location: [facility];  Service: Endoscopy;  Laterality: N/A;  . eye cautery    . EYE SURGERY     lasik  . EYE SURGERY Left    cataract extraction    Social History   Occupational History  . Not on file  Tobacco Use  . Smoking status: Never Smoker  . Smokeless tobacco: Never Used  Substance and Sexual Activity  . Alcohol use: Yes    Comment: rarely   . Drug use: No  . Sexual activity: Yes    Birth control/protection: None

## 2020-01-30 ENCOUNTER — Ambulatory Visit: Payer: Self-pay

## 2020-01-30 ENCOUNTER — Other Ambulatory Visit: Payer: Self-pay

## 2020-01-30 ENCOUNTER — Ambulatory Visit (INDEPENDENT_AMBULATORY_CARE_PROVIDER_SITE_OTHER): Payer: Managed Care, Other (non HMO) | Admitting: Family Medicine

## 2020-01-30 DIAGNOSIS — M67441 Ganglion, right hand: Secondary | ICD-10-CM | POA: Diagnosis not present

## 2020-01-30 NOTE — Progress Notes (Signed)
Subjective: She is here for right thumb cyst aspiration and injection. It has been present for a while, it does not hurt.  Objective: She has a dorsal cystic structure over the right thumb IP joint.  Limited diagnostic ultrasound: The mass in question is hypoechoic with well-defined borders. I cannot determine whether it originates from tendon or joint.  Procedure: Aspiration and injection of right thumb dorsal ganglion cyst: After sterile prep with Betadine, injected 1/2 cc 1% lidocaine without epinephrine, then aspirated the cyst using an 18-gauge needle, fully decompressed the cyst, then injected 20 mg methylprednisolone. She will follow-up as needed. She understands that there is a fairly high chance of recurrence.

## 2020-02-05 ENCOUNTER — Ambulatory Visit: Payer: Managed Care, Other (non HMO) | Admitting: Orthopaedic Surgery

## 2020-02-05 ENCOUNTER — Other Ambulatory Visit: Payer: Self-pay | Admitting: Obstetrics and Gynecology

## 2020-02-05 DIAGNOSIS — R109 Unspecified abdominal pain: Secondary | ICD-10-CM

## 2020-02-07 ENCOUNTER — Other Ambulatory Visit: Payer: Self-pay | Admitting: Obstetrics and Gynecology

## 2020-02-07 DIAGNOSIS — R102 Pelvic and perineal pain: Secondary | ICD-10-CM

## 2020-02-10 ENCOUNTER — Telehealth: Payer: Self-pay | Admitting: Orthopaedic Surgery

## 2020-02-10 NOTE — Telephone Encounter (Signed)
Patient callled advised she's decided to get her knee scoped and needed to get that scheduled. The number to contact patient is [phone]

## 2020-02-10 NOTE — Telephone Encounter (Signed)
Please advise 

## 2020-02-11 NOTE — Telephone Encounter (Signed)
Has appt coming up then can schedule after seen. Knee still bothering her.

## 2020-02-14 ENCOUNTER — Ambulatory Visit
Admission: RE | Admit: 2020-02-14 | Discharge: 2020-02-14 | Disposition: A | Discharge status: Home / Self Care | Payer: Managed Care, Other (non HMO) | Source: Ambulatory Visit | Attending: Obstetrics and Gynecology | Admitting: Obstetrics and Gynecology

## 2020-02-14 DIAGNOSIS — R102 Pelvic and perineal pain: Secondary | ICD-10-CM

## 2020-02-18 ENCOUNTER — Ambulatory Visit
Admission: RE | Admit: 2020-02-18 | Discharge: 2020-02-18 | Disposition: A | Discharge status: Home / Self Care | Payer: Managed Care, Other (non HMO) | Source: Ambulatory Visit | Attending: Obstetrics and Gynecology | Admitting: Obstetrics and Gynecology

## 2020-02-18 DIAGNOSIS — R102 Pelvic and perineal pain: Secondary | ICD-10-CM

## 2020-02-18 DIAGNOSIS — R109 Unspecified abdominal pain: Secondary | ICD-10-CM

## 2020-02-19 ENCOUNTER — Ambulatory Visit: Payer: Managed Care, Other (non HMO) | Admitting: Orthopaedic Surgery

## 2020-02-20 ENCOUNTER — Telehealth: Payer: Self-pay | Admitting: Orthopaedic Surgery

## 2020-02-20 NOTE — Telephone Encounter (Signed)
Patient has bronchitis right now.  She will call me when she is ready feeling better to schedule right knee arthroscopy with Dr. Ophelia Charter.  She has my name and direct number to call and schedule.

## 2020-02-21 NOTE — Telephone Encounter (Signed)
noted 

## 2020-03-02 NOTE — Progress Notes (Deleted)
Office Visit Note  Patient: [name] [name]             Date of Birth: [birth year]           MRN: [id number]             PCP: Kendrick Ranch, MD Referring: Kendrick Ranch, * Visit Date: 03/10/2020 Occupation: @GUAROCC @  Subjective:  No chief complaint on file.   History of Present Illness: [name] [name] is a 64 y.o. female ***   Activities of Daily Living:  Patient reports morning stiffness for *** {minute/hour:19697}.   Patient {ACTIONS;DENIES/REPORTS:21021675::"Denies"} nocturnal pain.  Difficulty dressing/grooming: {ACTIONS;DENIES/REPORTS:21021675::"Denies"} Difficulty climbing stairs: {ACTIONS;DENIES/REPORTS:21021675::"Denies"} Difficulty getting out of chair: {ACTIONS;DENIES/REPORTS:21021675::"Denies"} Difficulty using hands for taps, buttons, cutlery, and/or writing: {ACTIONS;DENIES/REPORTS:21021675::"Denies"}  No Rheumatology ROS completed.   PMFS History:  Patient Active Problem List   Diagnosis Date Noted  . Primary osteoarthritis of both knees 07/30/2018  . Primary osteoarthritis of both hands 07/30/2018  . Foot pain 05/15/2018  . Obstructive sleep apnea on CPAP 02/23/2017  . 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 Snoring 03/25/2015  . 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 Acute pancreatitis 10/26/2011  . Nausea and vomiting in adult 10/26/2011  . Dehydration 10/26/2011  . Gastroenteritis 10/26/2011  . Hypokalemia 10/26/2011  . Elevated liver function tests 10/26/2011  . Acid reflux   . Thyroid disease     Past Medical History:  Diagnosis Date  . Abnormal liver enzymes   . Acid reflux   . Arthritis    back and neck  . Bloating   . Bronchitis   . Chest pain, unspecified   . Diarrhea   . Dyspepsia   . Esophageal reflux   . H/O acute pancreatitis   . H/O hiatal hernia   . Headache(784.0)    migraines  . Hemorrhage of rectum and anus   . Hypercholesteremia   . Nausea   . Sjogren's  disease (HCC)   . Spinal stenosis, cervical region   . Thyroid disease   . Thyroid nodule     Family History  Problem Relation Age of Onset  . Colon cancer Mother   . Pancreatic cancer Brother   . Healthy Son   . Healthy Daughter    Past Surgical History:  Procedure Laterality Date  . ABDOMINAL HYSTERECTOMY  2007   total with BSO  . CHOLECYSTECTOMY    . COLONOSCOPY    . ECTOPIC PREGNANCY SURGERY    . ESOPHAGOGASTRODUODENOSCOPY    . EUS  12/21/2011   Procedure: ESOPHAGEAL ENDOSCOPIC ULTRASOUND (EUS) RADIAL;  Surgeon: 12/23/2011, MD;  Location: [facility];  Service: Endoscopy;  Laterality: N/A;  . eye cautery    . EYE SURGERY     lasik  . EYE SURGERY Left    cataract extraction    Social History   Social History Narrative   Married.   Drinks occasional sweet tea.   Works at [employer] as a [employer].    There is no immunization history on file for this patient.   Objective: Vital Signs: There were no vitals taken for this visit.   Physical Exam   Musculoskeletal Exam: ***  CDAI Exam: CDAI Score: -- Patient Global: --; Provider Global: -- Swollen: --; Tender: -- Joint Exam 03/10/2020   No joint exam has been documented for this visit   There is currently no information documented on the homunculus. Go to the Rheumatology activity and complete the homunculus  joint exam.  Investigation: No additional findings.  Imaging: US Abdomen Complete  Result Date: 02/18/2020 CLINICAL DATA:  Abdominal pain. EXAM: ABDOMEN ULTRASOUND COMPLETE COMPARISON:  CT 12/30/2015. FINDINGS: Gallbladder: Cholecystectomy. Common bile duct: Diameter: 6.6 mm Liver: No focal lesion identified. Within normal limits in parenchymal echogenicity. Portal vein is patent on color Doppler imaging with normal direction of blood flow towards the liver. IVC: No abnormality visualized. Pancreas: Visualized portion unremarkable. Spleen: Size and appearance within normal  limits. Right Kidney: Length: 11.7 cm. Echogenicity within normal limits. 3.7 cm cyst with thin septation consistent with benign cysts. No hydronephrosis visualized. Left Kidney: Length: 12.1 cm. Echogenicity within normal limits. 2.2 and 1.3 cm simple cysts. No hydronephrosis visualized. Abdominal aorta: No aneurysm visualized. Other findings: None. IMPRESSION: 1.  Cholecystectomy.  No biliary distention.  No acute abnormality. 2.  Bilateral benign-appearing renal cysts. Scratch Electronically Signed   By: Marcello Moores  Register   On: 02/18/2020 10:57   US PELVIS LIMITED (TRANSABDOMINAL ONLY)  Result Date: 02/15/2020 CLINICAL DATA:  Lower pelvic pain over hysterectomy incision site. Question hernia EXAM: LIMITED ULTRASOUND OF PELVIS TECHNIQUE: Limited transabdominal ultrasound examination of the pelvis was performed. COMPARISON:  None. FINDINGS: No focal soft tissue abnormality seen in the area a concern in the lower pelvic region. No visible hernia. IMPRESSION: No soft tissue abnormality or evidence of hernia. Electronically Signed   By: Rolm Baptise M.D.   On: 02/15/2020 21:21   US PELVIC COMPLETE WITH TRANSVAGINAL  Result Date: 02/18/2020 CLINICAL DATA:  Pelvic pain.  Status post hysterectomy. EXAM: TRANSABDOMINAL AND TRANSVAGINAL ULTRASOUND OF PELVIS TECHNIQUE: Both transabdominal and transvaginal ultrasound examinations of the pelvis were performed. Transabdominal technique was performed for global imaging of the pelvis including uterus, ovaries, adnexal regions, and pelvic cul-de-sac. It was necessary to proceed with endovaginal exam following the transabdominal exam to visualize the bilateral adnexa. COMPARISON:  12/30/2015 FINDINGS: Uterus Measurements: Uterus surgically absent Endometrium Thickness: Surgically absent. Right ovary Measurements: Not visualized. Left ovary Measurements: Not visualized Other findings No abnormal free fluid. IMPRESSION: 1. Status post hysterectomy. 2. No acute abnormality.   No mass noted. Electronically Signed   By: Kerby Moors M.D.   On: 02/18/2020 13:22    Recent Labs: Lab Results  Component Value Date   WBC 5.3 10/10/2019   HGB 14.2 10/10/2019   PLT 244 10/10/2019   NA 142 10/10/2019   K 4.1 10/10/2019   CL 106 10/10/2019   CO2 27 10/10/2019   GLUCOSE 91 10/10/2019   BUN 12 10/10/2019   CREATININE 0.82 10/10/2019   BILITOT 1.1 10/10/2019   ALKPHOS 84 10/26/2011   AST 16 10/10/2019   ALT 16 10/10/2019   PROT 7.5 10/10/2019   ALBUMIN 3.4 (L) 10/26/2011   CALCIUM 10.3 10/10/2019   GFRAA 89 10/10/2019    Speciality Comments: PLQ eye exam: 08/27/2019 normal. [facility]. Follow up in 6 months.  Procedures:  No procedures performed Allergies: Morphine and related, Other, and Sulfa antibiotics   Assessment / Plan:     Visit Diagnoses: No diagnosis found.  Orders: No orders of the defined types were placed in this encounter.  No orders of the defined types were placed in this encounter.   Face-to-face time spent with patient was *** minutes. Greater than 50% of time was spent in counseling and coordination of care.  Follow-Up Instructions: No follow-ups on file.   Earnestine Mealing, CMA  Note - This record has been created using Editor, commissioning.  Chart creation errors  have been sought, but may not always  have been located. Such creation errors do not reflect on  the standard of medical care.

## 2020-03-10 ENCOUNTER — Ambulatory Visit: Payer: Managed Care, Other (non HMO) | Admitting: Rheumatology

## 2020-05-19 ENCOUNTER — Telehealth: Payer: Self-pay | Admitting: Orthopaedic Surgery

## 2020-05-19 NOTE — Telephone Encounter (Signed)
Patient called requesting a call back. Patient states she has medical questions. Please call patient at [phone].

## 2020-05-21 NOTE — Telephone Encounter (Signed)
I called patient. Dr. Ophelia Charter spoke with her as well. She wanted to know about how long she will be out of work post operatively. Per Dr. Ophelia Charter, 2-6 weeks depending on what is found during surgery.

## 2020-06-11 HISTORY — PX: KNEE ARTHROSCOPY: SUR90

## 2020-06-15 ENCOUNTER — Other Ambulatory Visit: Payer: Self-pay | Admitting: Orthopaedic Surgery

## 2020-06-15 ENCOUNTER — Telehealth: Payer: Self-pay | Admitting: Orthopaedic Surgery

## 2020-06-15 DIAGNOSIS — S83281A Other tear of lateral meniscus, current injury, right knee, initial encounter: Secondary | ICD-10-CM

## 2020-06-15 MED ORDER — OXYCODONE-ACETAMINOPHEN 5-325 MG PO TABS: 1.0000 | ORAL_TABLET | ORAL | 0 refills | Status: DC | PRN

## 2020-06-15 NOTE — Telephone Encounter (Signed)
Pt called stating she had a question regards to the surgery she just had but states it's not anything to do with her medicine and asks for a CB  [phone]

## 2020-06-15 NOTE — Telephone Encounter (Signed)
Patient is in knee immobilizer. She would like to know how long she needs to be in it. Please advise.

## 2020-06-15 NOTE — Progress Notes (Signed)
Post op pain 

## 2020-06-16 NOTE — Telephone Encounter (Signed)
I called discussed, had femoral nerve block. KI until block wears off. Has walker

## 2020-06-22 ENCOUNTER — Telehealth: Payer: Self-pay | Admitting: Orthopaedic Surgery

## 2020-06-22 NOTE — Telephone Encounter (Signed)
[employer] forms received. Sent to [employer].

## 2020-06-23 ENCOUNTER — Encounter: Payer: Self-pay | Admitting: Orthopaedic Surgery

## 2020-06-23 ENCOUNTER — Ambulatory Visit (INDEPENDENT_AMBULATORY_CARE_PROVIDER_SITE_OTHER): Payer: Managed Care, Other (non HMO) | Admitting: Orthopaedic Surgery

## 2020-06-23 ENCOUNTER — Other Ambulatory Visit: Payer: Self-pay

## 2020-06-23 VITALS — Ht 66.0 in | Wt 197.0 lb

## 2020-06-23 DIAGNOSIS — M170 Bilateral primary osteoarthritis of knee: Secondary | ICD-10-CM

## 2020-06-23 NOTE — Progress Notes (Signed)
Office Visit Note   Patient: [name] [name]           Date of Birth: 05-Oct-1955           MRN: [id number] Visit Date: 06/23/2020              Requested by: Kendrick Ranch, MD [address] [place],  [place] [address] PCP: Kendrick Ranch, MD   Assessment & Plan: Visit Diagnoses:  1. Primary osteoarthritis of both knees        Post right knee posterior medial meniscectomy and femoral chondroplasty.  Plan: Work slip given no work x3 weeks she has been out since her surgery 06/15/2020.  She works for [employer] and has a standing job where she is using the computer.  She stopped her pain medication at some problems with a rash she can use some Tylenol as she has been doing and add some Aleve 2 twice a day we reviewed with patient and her husband arthroscopic photos which showed a chondroplasty and a significant posterior meniscal tear which was trimmed back.  Tear was complex.  Work slip no work for 3 weeks I will recheck her in 3 weeks.  Follow-Up Instructions: Return in about 3 weeks (around 07/14/2020).   Orders:  No orders of the defined types were placed in this encounter.  No orders of the defined types were placed in this encounter.     Procedures: No procedures performed   Clinical Data: No additional findings.   Subjective: Chief Complaint  Patient presents with  . Right Knee - Routine Post Op    06/15/2020 right knee arthroscopy, [facility]    HPI  Review of Systems   Objective: Vital Signs: Ht 5\' 6"  (1.676 m)   Wt 197 lb (89.4 kg)   BMI 31.80 kg/m   Physical Exam  Ortho Exam  Specialty Comments:  No specialty comments available.  Imaging: No results found.   PMFS History: Patient Active Problem List   Diagnosis Date Noted  . Primary osteoarthritis of both knees 07/30/2018  . Primary osteoarthritis of both hands 07/30/2018  . Foot pain 05/15/2018  . Obstructive sleep apnea on CPAP 02/23/2017  . 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  . Snoring 03/25/2015  . 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 Acute pancreatitis 10/26/2011  . Nausea and vomiting in adult 10/26/2011  . Dehydration 10/26/2011  . Gastroenteritis 10/26/2011  . Hypokalemia 10/26/2011  . Elevated liver function tests 10/26/2011  . Acid reflux   . Thyroid disease    Past Medical History:  Diagnosis Date  . Abnormal liver enzymes   . Acid reflux   . Arthritis    back and neck  . Bloating   . Bronchitis   . Chest pain, unspecified   . Diarrhea   . Dyspepsia   . Esophageal reflux   . H/O acute pancreatitis   . H/O hiatal hernia   . Headache(784.0)    migraines  . Hemorrhage of rectum and anus   . Hypercholesteremia   . Nausea   . Sjogren's disease (HCC)   . Spinal stenosis, cervical region   . Thyroid disease   . Thyroid nodule     Family History  Problem Relation Age of Onset  . Colon cancer Mother   . Pancreatic cancer Brother   . Healthy Son   . Healthy Daughter     Past Surgical History:  Procedure Laterality Date  .  ABDOMINAL HYSTERECTOMY  2007   total with BSO  . CHOLECYSTECTOMY    . COLONOSCOPY    . ECTOPIC PREGNANCY SURGERY    . ESOPHAGOGASTRODUODENOSCOPY    . EUS  12/21/2011   Procedure: ESOPHAGEAL ENDOSCOPIC ULTRASOUND (EUS) RADIAL;  Surgeon: Willis Modena, MD;  Location: [facility];  Service: Endoscopy;  Laterality: N/A;  . eye cautery    . EYE SURGERY     lasik  . EYE SURGERY Left    cataract extraction    Social History   Occupational History  . Not on file  Tobacco Use  . Smoking status: Never Smoker  . Smokeless tobacco: Never Used  Vaping Use  . Vaping Use: Never used  Substance and Sexual Activity  . Alcohol use: Yes    Comment: rarely   . Drug use: No  . Sexual activity: Yes    Birth control/protection: None

## 2020-07-07 ENCOUNTER — Telehealth: Payer: Self-pay | Admitting: Orthopaedic Surgery

## 2020-07-07 NOTE — Telephone Encounter (Signed)
Pt states she had an orthoscopic surgery about 3 weeks ago and her knee often catches on itself and when it does this there's a sharp pain and the feeling that her leg will give out she would like to know if there's anything she should be doing to prevent this  [phone]

## 2020-07-07 NOTE — Telephone Encounter (Signed)
Patient aware of the below  

## 2020-07-07 NOTE — Telephone Encounter (Signed)
Suppose to be seen 3 wks from 10/19 appt. So yes make ROV thanks

## 2020-07-07 NOTE — Telephone Encounter (Signed)
She has an appointment scheduled for next week. Do you need to see her sooner? If not, what would you like for me to tell her in regards to her symptoms?

## 2020-07-07 NOTE — Telephone Encounter (Signed)
She can apply aspircreme tid  to knee thanks

## 2020-07-07 NOTE — Telephone Encounter (Signed)
Please advise. Would you like for me to make patient a follow up appointment?

## 2020-07-07 NOTE — Telephone Encounter (Signed)
Can you please call patient and advise? She has follow up appt next week.  Thanks.

## 2020-07-14 ENCOUNTER — Ambulatory Visit (INDEPENDENT_AMBULATORY_CARE_PROVIDER_SITE_OTHER): Payer: Managed Care, Other (non HMO) | Admitting: Orthopaedic Surgery

## 2020-07-14 ENCOUNTER — Encounter: Payer: Self-pay | Admitting: Orthopaedic Surgery

## 2020-07-14 VITALS — BP 127/81 | HR 80 | Ht 66.0 in | Wt 197.0 lb

## 2020-07-14 DIAGNOSIS — M170 Bilateral primary osteoarthritis of knee: Secondary | ICD-10-CM

## 2020-07-14 NOTE — Progress Notes (Signed)
Post-Op Visit Note   Patient: [name] [name]           Date of Birth: March 15, 1956           MRN: [id number] Visit Date: 07/14/2020 PCP: Kendrick Ranch, MD   Assessment & Plan: Postop knee arthroscopy partial meniscectomy.  Work slip given for work resumption tomorrow 07/15/2020.  She is working from home she has sit down and stand up desk.  She still ambulatory with a limp and still has swelling.  We discussed the tricompartmental degenerative changes present in her knee as well as a meniscal tear which was trimmed.  She will take the Aleve 2 twice daily with food.  Continue Voltaren cream.  Recheck 2 months.  Chief Complaint:  Chief Complaint  Patient presents with   Right Knee - Follow-up    06/15/2020 Right knee arthroscopy, [facility]   Visit Diagnoses:  1. Primary osteoarthritis of both knees     Plan: Recheck 2 months.  Today we reviewed MRI findings as well as plain radiograph findings.  She states she really does not like to take medication but if she takes Aleve 2 p.o. twice daily with food she should get decrease swelling.  CBC and chemistry panels have been normal.  Follow-Up Instructions: Return in about 2 months (around 09/13/2020).   Orders:  No orders of the defined types were placed in this encounter.  No orders of the defined types were placed in this encounter.   Imaging: No results found.  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   Snoring 03/25/2015   Hypersomnia with sleep apnea 03/25/2015   Multinodular goiter (nontoxic) 02/08/2012   Acute pancreatitis 10/26/2011   Nausea and vomiting in adult 10/26/2011   Dehydration 10/26/2011   Gastroenteritis 10/26/2011   Hypokalemia 10/26/2011   Elevated liver function  tests 10/26/2011   Acid reflux    Thyroid disease    Past Medical History:  Diagnosis Date   Abnormal liver enzymes    Acid reflux    Arthritis    back and neck   Bloating    Bronchitis    Chest pain, unspecified    Diarrhea    Dyspepsia    Esophageal reflux    H/O acute pancreatitis    H/O hiatal hernia    Headache(784.0)    migraines   Hemorrhage of rectum and anus    Hypercholesteremia    Nausea    Sjogren's disease (HCC)    Spinal stenosis, cervical region    Thyroid disease    Thyroid nodule     Family History  Problem Relation Age of Onset   Colon cancer Mother    Pancreatic cancer Brother    Healthy Son    Healthy Daughter     Past Surgical History:  Procedure Laterality Date   ABDOMINAL HYSTERECTOMY  2007   total with BSO   CHOLECYSTECTOMY     COLONOSCOPY     ECTOPIC PREGNANCY SURGERY     ESOPHAGOGASTRODUODENOSCOPY     EUS  12/21/2011   Procedure: ESOPHAGEAL ENDOSCOPIC ULTRASOUND (EUS) RADIAL;  Surgeon: [employer], MD;  Location: [facility];  Service: Endoscopy;  Laterality: N/A;   eye cautery     EYE SURGERY     lasik  EYE SURGERY Left    cataract extraction    Social History   Occupational History   Not on file  Tobacco Use   Smoking status: Never Smoker   Smokeless tobacco: Never Used  Vaping Use   Vaping Use: Never used  Substance and Sexual Activity   Alcohol use: Yes    Comment: rarely    Drug use: No   Sexual activity: Yes    Birth control/protection: None

## 2020-09-05 IMAGING — US US PELVIS LIMITED
1 series · 8 of 8 positions shown · non-contrast
Comparison: None.

CLINICAL DATA: Lower pelvic pain over hysterectomy incision site.
Question hernia

EXAM:
LIMITED ULTRASOUND OF PELVIS
TECHNIQUE: Limited transabdominal ultrasound examination of the pelvis was
performed.

[Series 1: us pelvis limited · 0.06mm/px · 8 acquisitions, 8 frames shown]
[im 1/8]
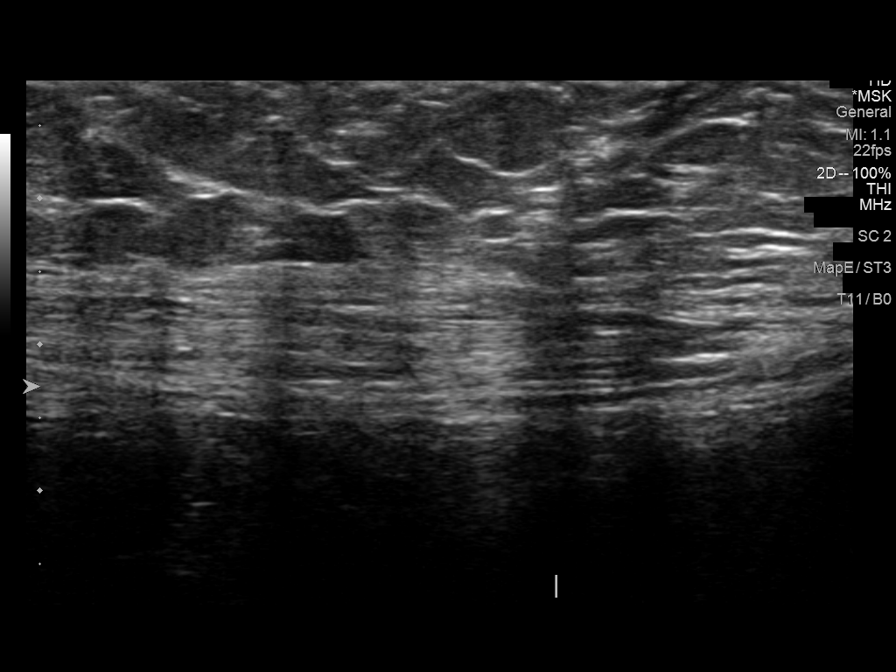
[im 2/8]
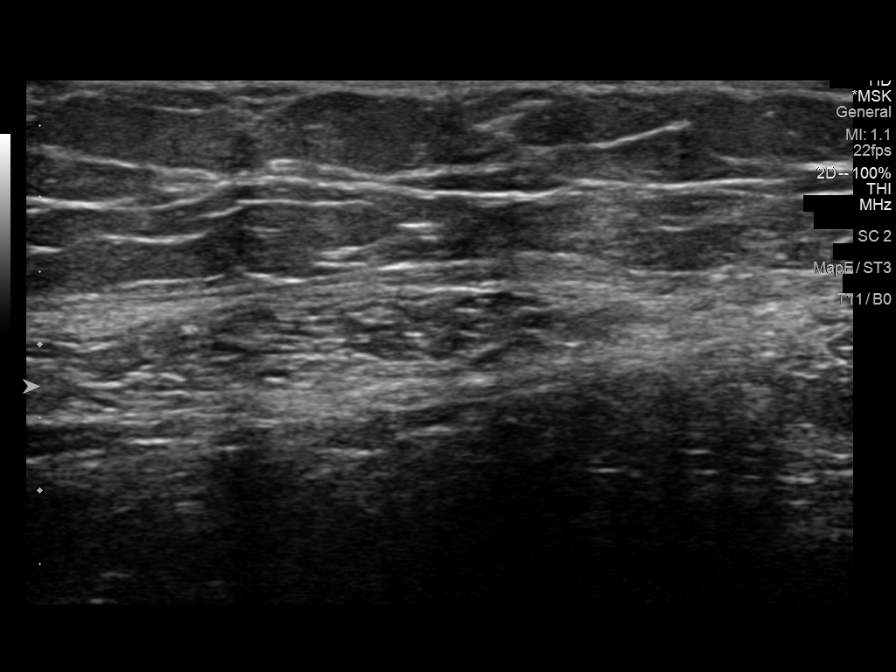
[im 3/8]
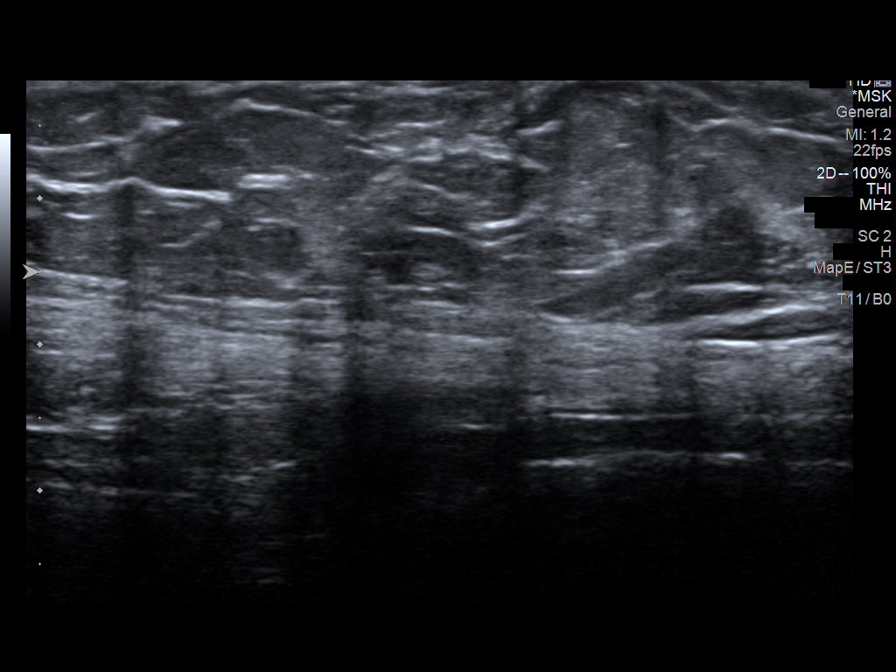
[im 4/8]
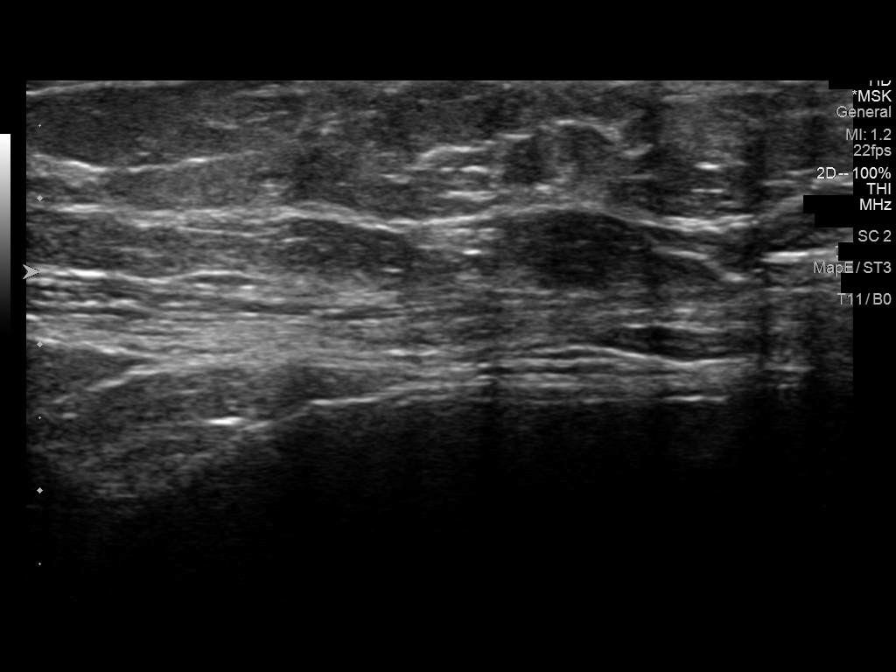
[im 5/8]
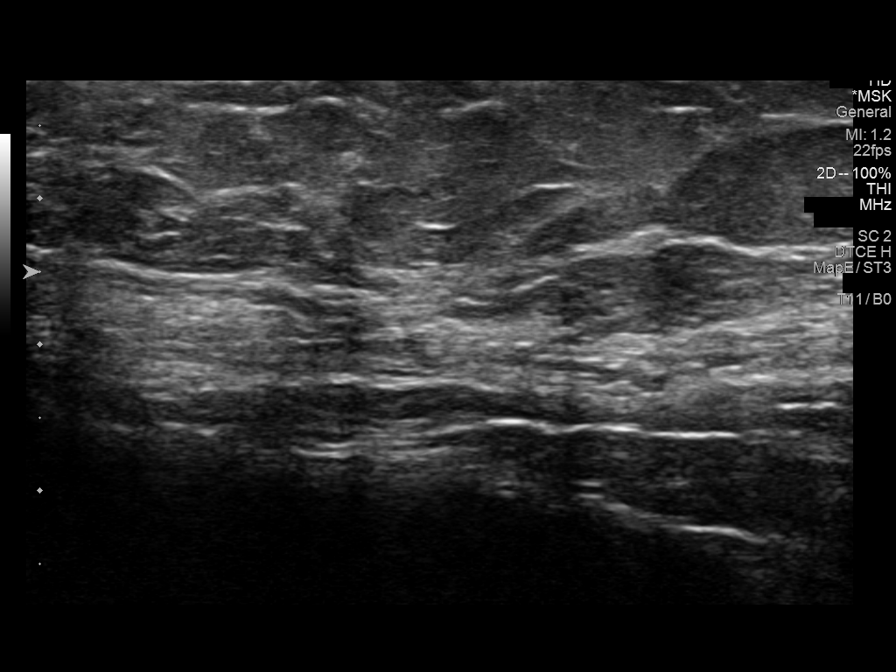
[im 6/8]
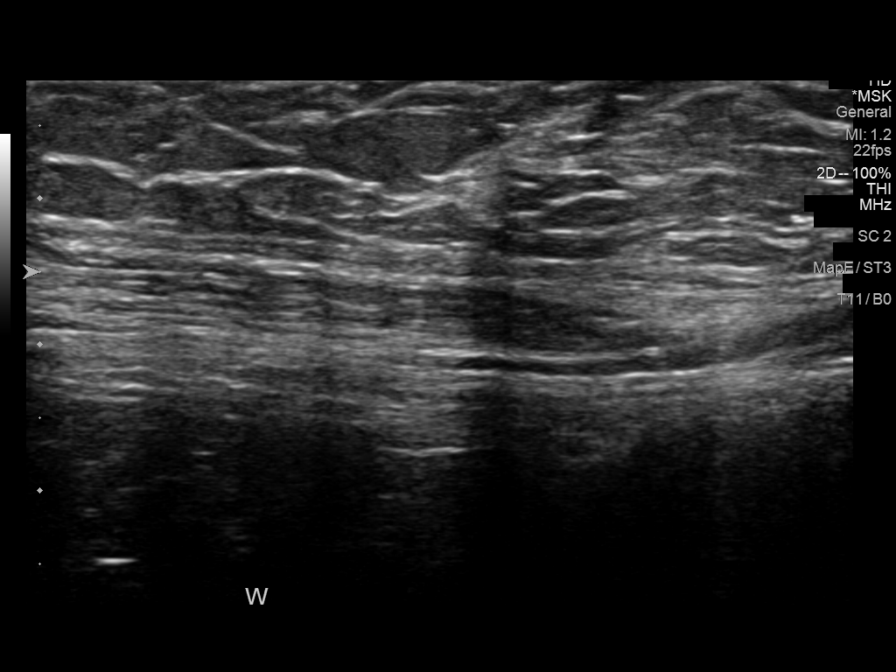
[im 7/8]
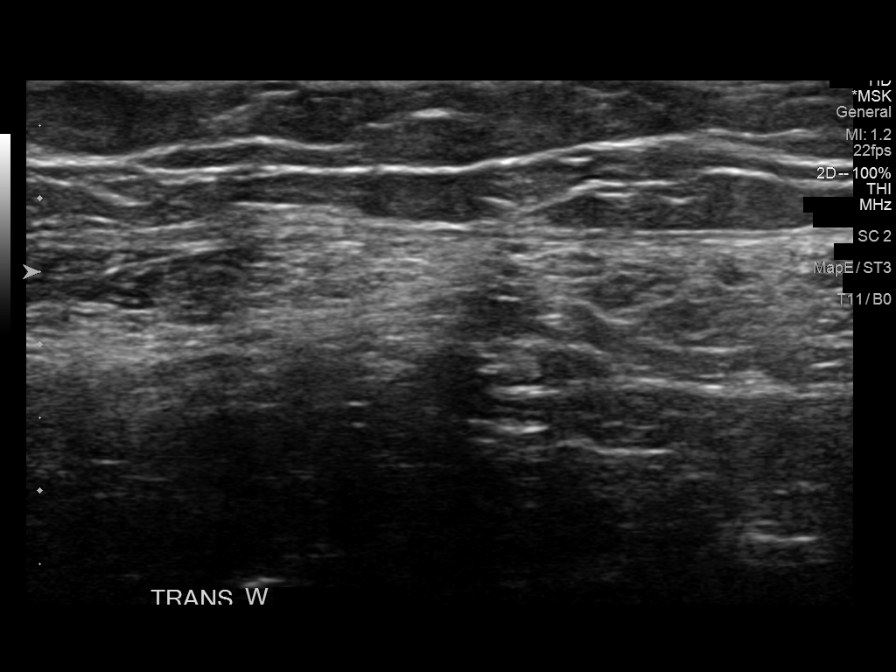
[im 8/8]
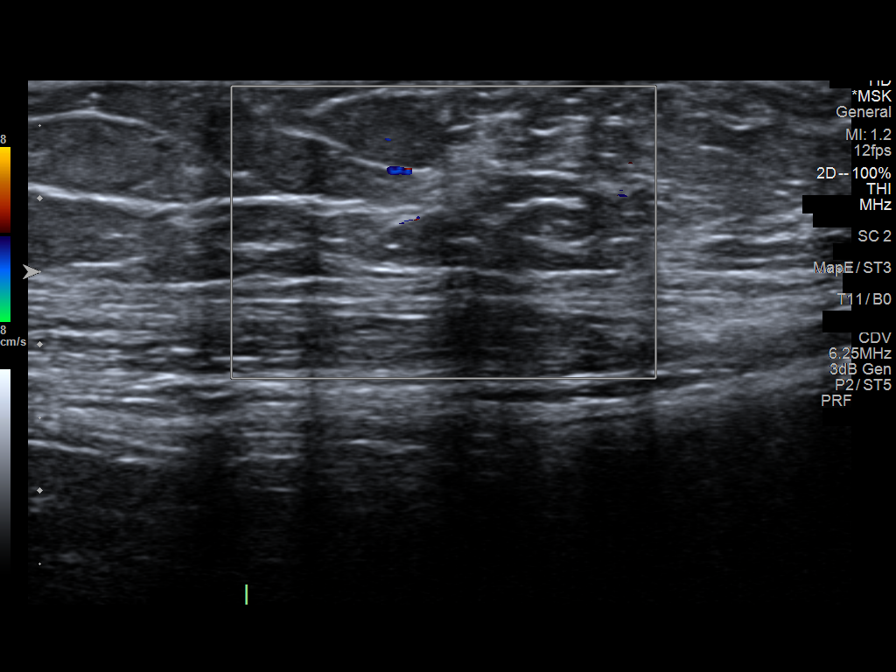

[8 of 8 positions shown; findings below may reference images not displayed]

FINDINGS: No focal soft tissue abnormality seen in the area a concern in the
lower pelvic region. No visible hernia.
IMPRESSION: No soft tissue abnormality or evidence of hernia.

## 2020-09-09 IMAGING — US US PELVIS COMPLETE WITH TRANSVAGINAL
1 series · 14 of 25 positions shown · non-contrast
Comparison: 12/30/2015

CLINICAL DATA: Pelvic pain.  Status post hysterectomy.

EXAM:
TRANSABDOMINAL AND TRANSVAGINAL ULTRASOUND OF PELVIS
TECHNIQUE: Both transabdominal and transvaginal ultrasound examinations of the
pelvis were performed. Transabdominal technique was performed for
global imaging of the pelvis including uterus, ovaries, adnexal
regions, and pelvic cul-de-sac. It was necessary to proceed with
endovaginal exam following the transabdominal exam to visualize the
bilateral adnexa.

[Series 1: us pelvis complete with transvaginal · 0.33mm/px · 14 of 36 slices shown]
[im 1/36]
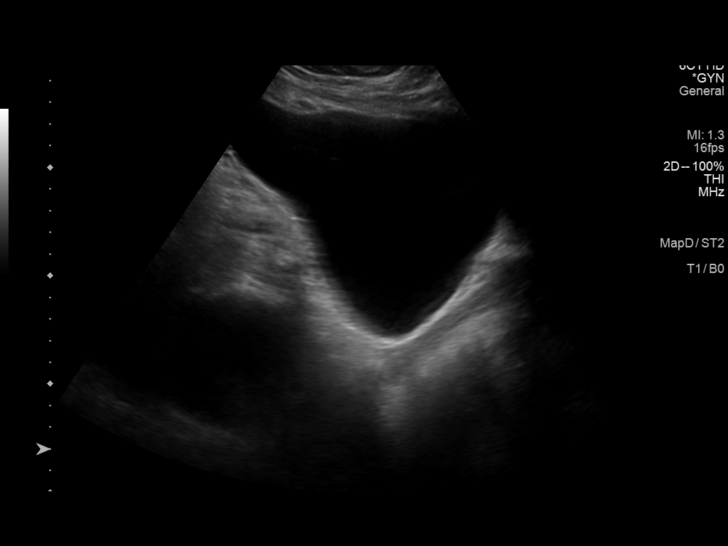
[im 3/36]
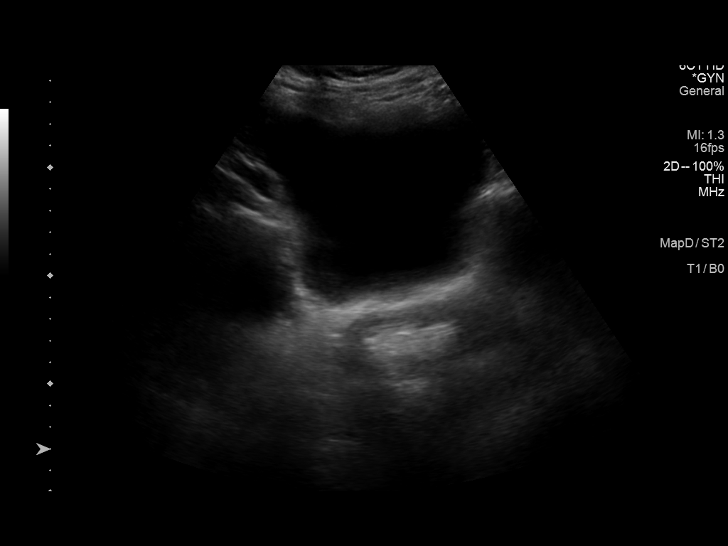
[im 6/36]
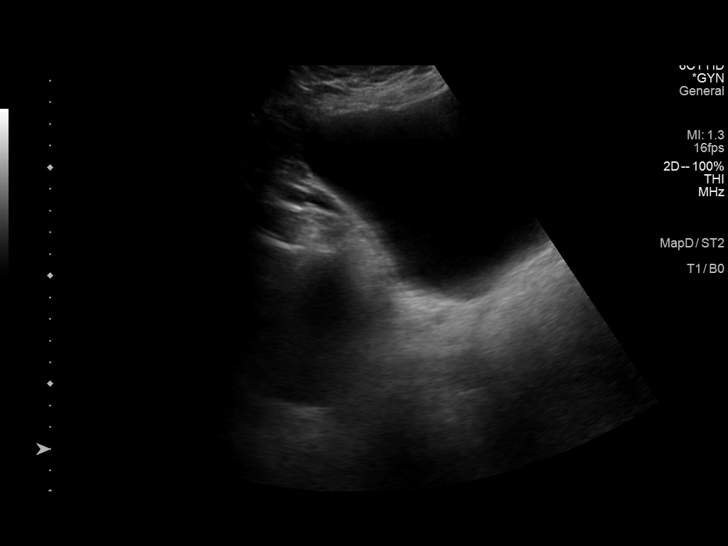
[im 9/36]
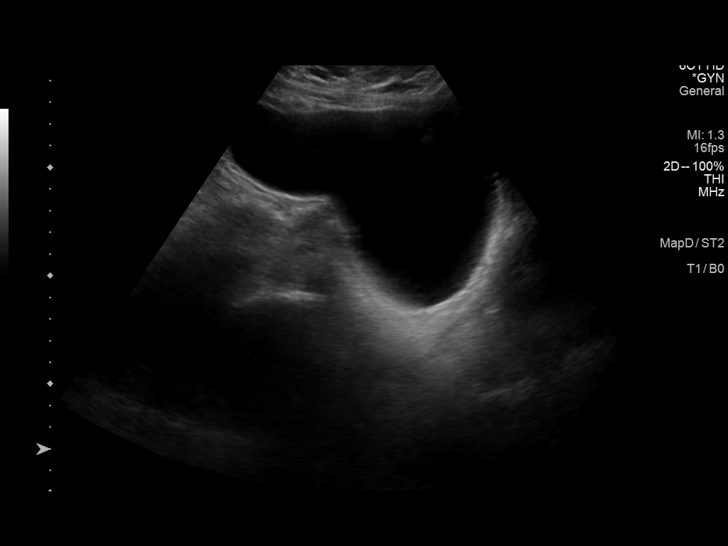
[im 12/36]
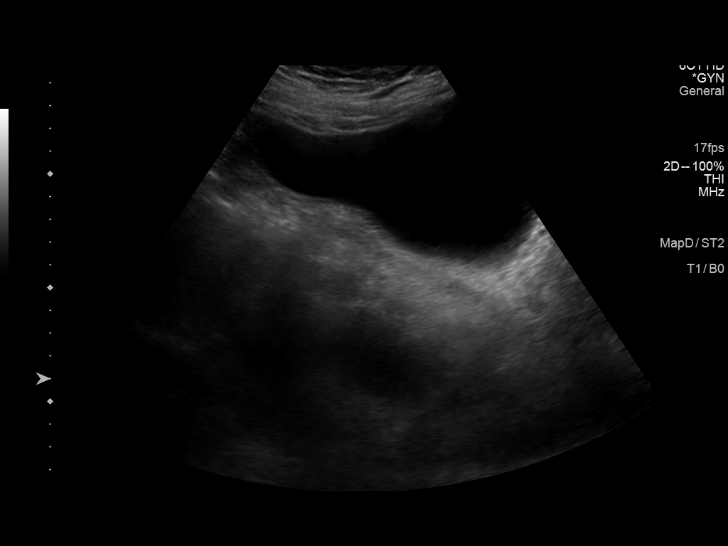
[im 14/36]
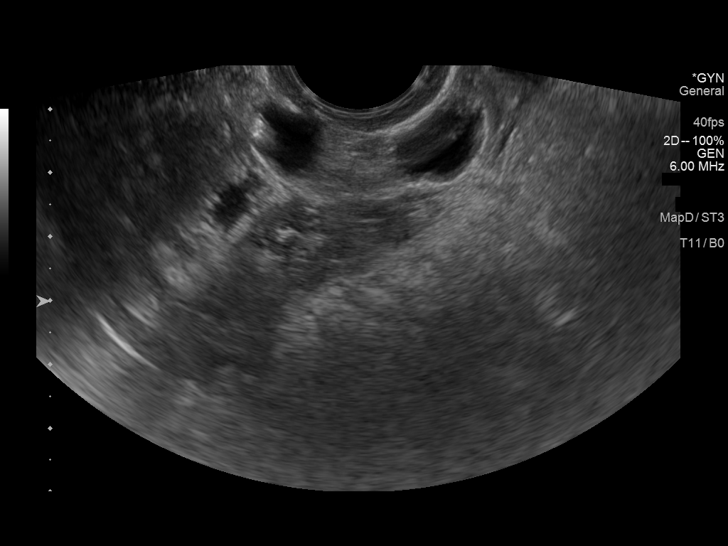
[im 17/36]
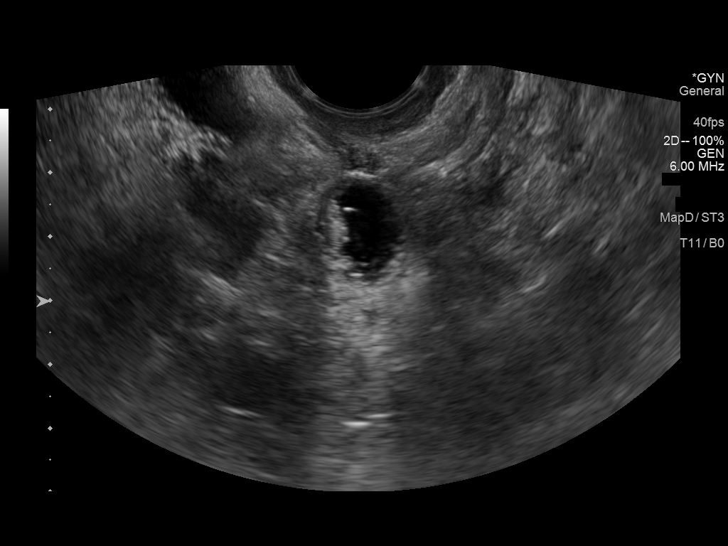
[im 19/36]
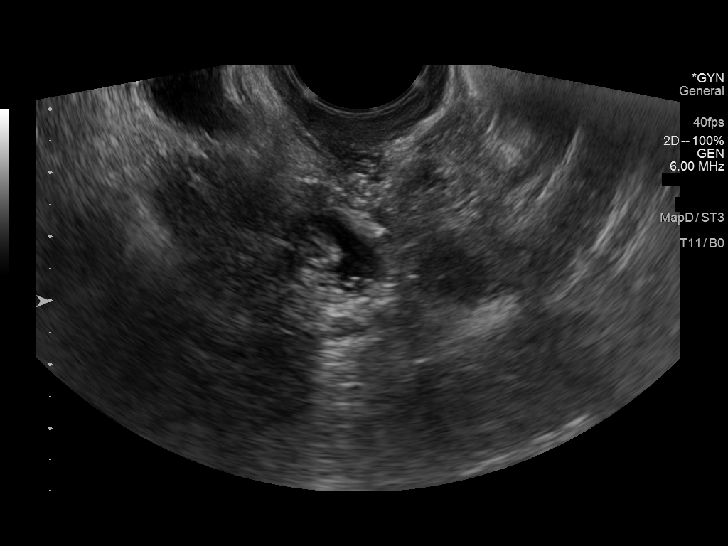
[im 22/36]
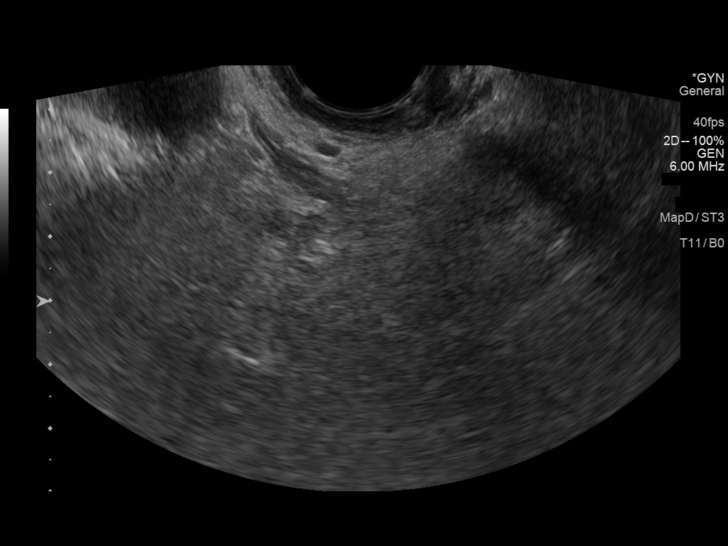
[im 24/36]
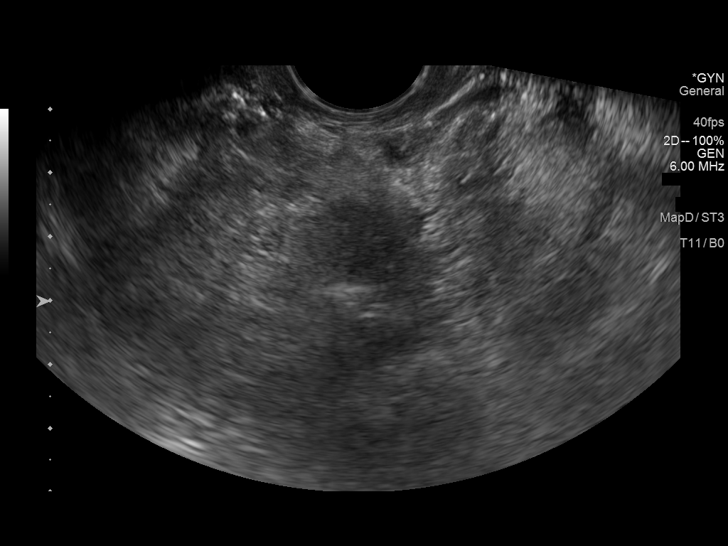
[im 27/36]
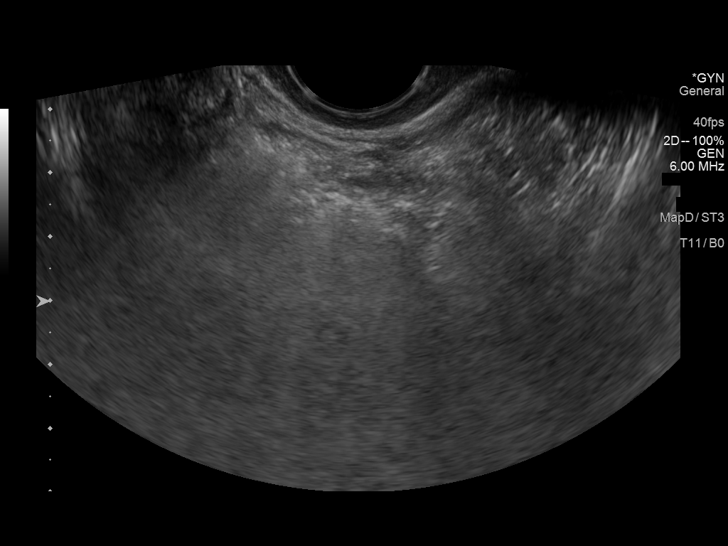
[im 30/36]
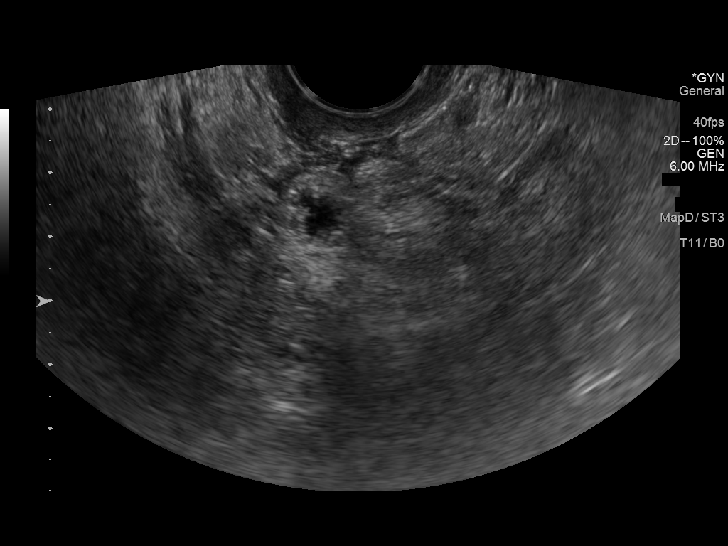
[im 33/36]
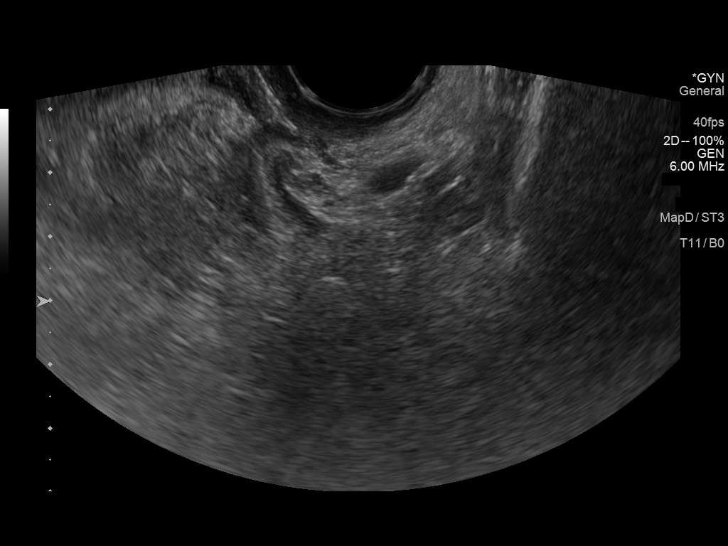
[im 36/36]
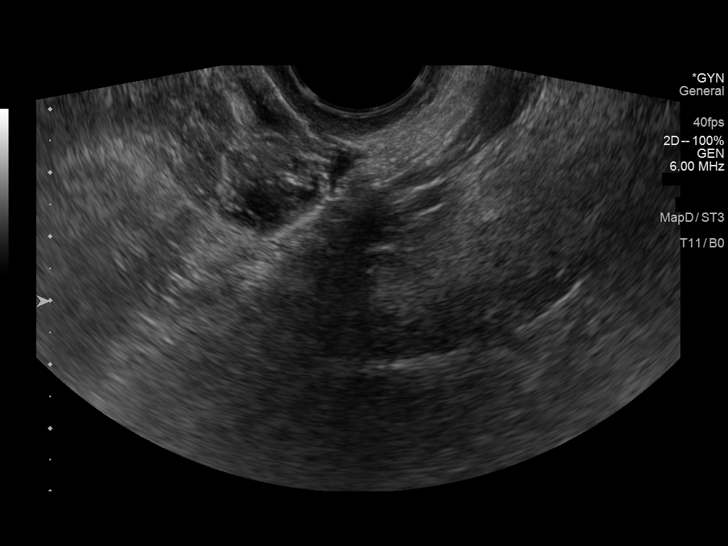

[14 of 25 positions shown; findings below may reference images not displayed]

FINDINGS: Uterus

Measurements: Uterus surgically absent

Endometrium

Thickness: Surgically absent.

Right ovary

Measurements: Not visualized.

Left ovary

Measurements: Not visualized

Other findings

No abnormal free fluid.
IMPRESSION: 1. Status post hysterectomy.
2. No acute abnormality.  No mass noted.

## 2020-09-29 ENCOUNTER — Ambulatory Visit (INDEPENDENT_AMBULATORY_CARE_PROVIDER_SITE_OTHER): Payer: Managed Care, Other (non HMO) | Admitting: Rheumatology

## 2020-09-29 ENCOUNTER — Encounter: Payer: Self-pay | Admitting: Rheumatology

## 2020-09-29 ENCOUNTER — Other Ambulatory Visit: Payer: Self-pay

## 2020-09-29 VITALS — BP 124/82 | HR 78 | Resp 15 | Ht 66.5 in | Wt 198.6 lb

## 2020-09-29 DIAGNOSIS — M3509 Sicca syndrome with other organ involvement: Secondary | ICD-10-CM

## 2020-09-29 DIAGNOSIS — Z8669 Personal history of other diseases of the nervous system and sense organs: Secondary | ICD-10-CM

## 2020-09-29 DIAGNOSIS — M19041 Primary osteoarthritis, right hand: Secondary | ICD-10-CM

## 2020-09-29 DIAGNOSIS — Z79899 Other long term (current) drug therapy: Secondary | ICD-10-CM | POA: Diagnosis not present

## 2020-09-29 DIAGNOSIS — M170 Bilateral primary osteoarthritis of knee: Secondary | ICD-10-CM

## 2020-09-29 DIAGNOSIS — R768 Other specified abnormal immunological findings in serum: Secondary | ICD-10-CM

## 2020-09-29 DIAGNOSIS — Z8639 Personal history of other endocrine, nutritional and metabolic disease: Secondary | ICD-10-CM

## 2020-09-29 DIAGNOSIS — M19042 Primary osteoarthritis, left hand: Secondary | ICD-10-CM

## 2020-09-29 DIAGNOSIS — M25461 Effusion, right knee: Secondary | ICD-10-CM

## 2020-09-29 DIAGNOSIS — R2689 Other abnormalities of gait and mobility: Secondary | ICD-10-CM

## 2020-09-29 NOTE — Progress Notes (Signed)
Office Visit Note  Patient: [name] [name]             Date of Birth: Apr 15, 1956           MRN: [id number]             PCP: Kendrick Ranch, MD Referring: Kendrick Ranch, * Visit Date: 09/29/2020 Occupation: @GUAROCC @  Subjective:  Dry eyes.   History of Present Illness: [name] [name] is a 65 y.o. female returns today after her last visit in February 2021.  She states she tried Plaquenil and did not feel much difference and stopped the medication after using it for 3 months.  She continues to have dry eyes for which she uses over-the-counter products as needed.  She states she underwent right knee joint arthroscopic surgery in October 2021 by Dr. November 2021.  She continues to have discomfort in her right knee joint.  She continues to have some discomfort in her hands due to underlying osteoarthritis.  Activities of Daily Living:  Patient reports morning stiffness for 2 minutes.   Patient Reports nocturnal pain.  Difficulty dressing/grooming: Denies Difficulty climbing stairs: Denies Difficulty getting out of chair: Denies Difficulty using hands for taps, buttons, cutlery, and/or writing: Denies  Review of Systems  Constitutional: Negative for fatigue.  HENT: Negative for mouth sores, mouth dryness and nose dryness.   Eyes: Positive for dryness. Negative for pain and itching.  Respiratory: Negative for shortness of breath and difficulty breathing.   Cardiovascular: Positive for palpitations. Negative for chest pain.  Gastrointestinal: Negative for blood in stool, constipation and diarrhea.  Endocrine: Negative for increased urination.  Genitourinary: Negative for difficulty urinating.  Musculoskeletal: Positive for arthralgias, joint pain and morning stiffness. Negative for joint swelling, myalgias, muscle tenderness and myalgias.  Skin: Negative for color change, rash and redness.  Allergic/Immunologic: Negative for susceptible to infections.  Neurological: Negative  for dizziness, numbness, headaches, memory loss and weakness.  Hematological: Negative for bruising/bleeding tendency.  Psychiatric/Behavioral: Negative for confusion.    PMFS History:  Patient Active Problem List   Diagnosis Date Noted  . Primary osteoarthritis of both knees 07/30/2018  . Primary osteoarthritis of both hands 07/30/2018  . Foot pain 05/15/2018  . Obstructive sleep apnea on CPAP 02/23/2017  . 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 Snoring 03/25/2015  . 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 Acute pancreatitis 10/26/2011  . Nausea and vomiting in adult 10/26/2011  . Dehydration 10/26/2011  . Gastroenteritis 10/26/2011  . Hypokalemia 10/26/2011  . Elevated liver function tests 10/26/2011  . Acid reflux   . Thyroid disease     Past Medical History:  Diagnosis Date  . Abnormal liver enzymes   . Acid reflux   . Arthritis    back and neck  . Bloating   . Bronchitis   . Chest pain, unspecified   . Diarrhea   . Dyspepsia   . Esophageal reflux   . H/O acute pancreatitis   . H/O hiatal hernia   . Headache(784.0)    migraines  . Hemorrhage of rectum and anus   . Hypercholesteremia   . Nausea   . Sjogren's disease (HCC)   . Spinal stenosis, cervical region   . Thyroid disease   . Thyroid nodule     Family History  Problem Relation Age of Onset  . Colon cancer Mother   . Pancreatic cancer Brother   . Healthy Son   . Healthy Daughter  Past Surgical History:  Procedure Laterality Date  . ABDOMINAL HYSTERECTOMY  2007   total with BSO  . CHOLECYSTECTOMY    . COLONOSCOPY    . ECTOPIC PREGNANCY SURGERY    . ESOPHAGOGASTRODUODENOSCOPY    . EUS  12/21/2011   Procedure: ESOPHAGEAL ENDOSCOPIC ULTRASOUND (EUS) RADIAL;  Surgeon: Willis Modena, MD;  Location: [facility];  Service: Endoscopy;  Laterality: N/A;  . eye cautery    . EYE SURGERY     lasik  . EYE SURGERY Left    cataract extraction   . KNEE  ARTHROSCOPY Right 06/11/2020   torn meniscus    Social History   Social History Narrative   Married.   Drinks occasional sweet tea.   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   Immunization History  Administered Date(s) Administered  . PFIZER(Purple Top)SARS-COV-2 Vaccination 11/22/2019, 12/13/2019, 07/01/2020     Objective: Vital Signs: BP 124/82 (BP Location: Left Arm, Patient Position: Sitting, Cuff Size: Large)   Pulse 78   Resp 15   Ht 5' 6.5" (1.689 m)   Wt 198 lb 9.6 oz (90.1 kg)   BMI 31.57 kg/m    Physical Exam Vitals and nursing note reviewed.  Constitutional:      Appearance: She is well-developed and well-nourished.  HENT:     Head: Normocephalic and atraumatic.  Eyes:     Extraocular Movements: EOM normal.     Conjunctiva/sclera: Conjunctivae normal.  Cardiovascular:     Rate and Rhythm: Normal rate and regular rhythm.     Pulses: Intact distal pulses.     Heart sounds: Normal heart sounds.  Pulmonary:     Effort: Pulmonary effort is normal.     Breath sounds: Normal breath sounds.  Abdominal:     General: Bowel sounds are normal.     Palpations: Abdomen is soft.  Musculoskeletal:     Cervical back: Normal range of motion.  Lymphadenopathy:     Cervical: No cervical adenopathy.  Skin:    General: Skin is warm and dry.     Capillary Refill: Capillary refill takes less than 2 seconds.  Neurological:     Mental Status: She is alert and oriented to person, place, and time.  Psychiatric:        Mood and Affect: Mood and affect normal.        Behavior: Behavior normal.      Musculoskeletal Exam: C-spine, thoracic and lumbar spine with good range of motion.  Shoulder joints, elbow joints, wrist joints, MCPs PIPs and DIPs with good range of motion with no synovitis.  Hip joints with good range of motion.  She has warmth swelling and effusion in her right knee joint.  Left knee joint was in good range of motion.  Ankle joints, MTPs and  PIPs with good range of motion with no synovitis.  CDAI Exam: CDAI Score: -- Patient Global: --; Provider Global: -- Swollen: --; Tender: -- Joint Exam 09/29/2020   No joint exam has been documented for this visit   There is currently no information documented on the homunculus. Go to the Rheumatology activity and complete the homunculus joint exam.  Investigation: No additional findings.  Imaging: No results found.  Recent Labs: Lab Results  Component Value Date   WBC 5.3 10/10/2019   HGB 14.2 10/10/2019   PLT 244 10/10/2019   NA 142 10/10/2019   K 4.1 10/10/2019   CL 106 10/10/2019   CO2 27 10/10/2019   GLUCOSE 91 10/10/2019  BUN 12 10/10/2019   CREATININE 0.82 10/10/2019   BILITOT 1.1 10/10/2019   ALKPHOS 84 10/26/2011   AST 16 10/10/2019   ALT 16 10/10/2019   PROT 7.5 10/10/2019   ALBUMIN 3.4 (L) 10/26/2011   CALCIUM 10.3 10/10/2019   GFRAA 89 10/10/2019    Speciality Comments: PLQ eye exam: 08/27/2019 normal. [facility]. Follow up in 6 months. PLQ started 07/2019  Procedures:  No procedures performed Allergies: Morphine and related, Other, and Sulfa antibiotics   Assessment / Plan:     Visit Diagnoses: Sjogren's syndrome with other organ involvement (HCC) - ANA 1: 80 speckled, positive Ro antibody, history of dry eyes and dry skin -she continues to have sicca symptoms.  She has been using over-the-counter products.  I will recheck her labs today.  Plan: CBC with Differential/Platelet, COMPLETE METABOLIC PANEL WITH GFR, Sedimentation rate, ANA, Sjogrens syndrome-A extractable nuclear antibody.  We will contact her once lab results are available.  High risk medication use -she took Plaquenil 200 mg p.o. twice daily for about 3 months and then discontinued as she did not notice any benefit..  Last eye examination July 28, 2019. - Plan: CBC with Differential/Platelet, COMPLETE METABOLIC PANEL WITH GFR, Serum protein electrophoresis with  reflex  Antineutrophil cytoplasmic antibody (ANCA) positive - Patient has no clinical features of vasculitis.  Sed rate has been normal.  Primary osteoarthritis of both hands-she had protection muscle strengthening was discussed.  Effusion, right knee -she has large effusion in her right knee joint and warmth.  She has been limping and having difficulty walking.  She had arthroscopic surgery by Dr. Ophelia Charter in October.  I have advised her to schedule a follow-up visit for aspiration of the knee joint.  Synovial fluid analysis will be helpful.  Plan: Rheumatoid factor, Uric acid  Primary osteoarthritis of both knees - She tried physical therapy and has been followed by Dr. Ophelia Charter.  History of sleep apnea  History of thyroid disease  Imbalance  Orders: Orders Placed This Encounter  Procedures  . CBC with Differential/Platelet  . COMPLETE METABOLIC PANEL WITH GFR  . Sedimentation rate  . ANA  . Sjogrens syndrome-A extractable nuclear antibody  . Rheumatoid factor  . Uric acid  . Serum protein electrophoresis with reflex   No orders of the defined types were placed in this encounter.    Follow-Up Instructions: Return in about 6 months (around 03/29/2021) for Sjogren's.   Pollyann Savoy, MD  Note - This record has been created using Animal nutritionist.  Chart creation errors have been sought, but may not always  have been located. Such creation errors do not reflect on  the standard of medical care.

## 2020-10-01 LAB — COMPLETE METABOLIC PANEL WITH GFR
AG Ratio: 1.8 (calc) (ref 1.0–2.5)
ALT: 14 U/L (ref 6–29)
AST: 14 U/L (ref 10–35)
Albumin: 4.6 g/dL (ref 3.6–5.1)
Alkaline phosphatase (APISO): 87 U/L (ref 37–153)
BUN: 18 mg/dL (ref 7–25)
CO2: 28 mmol/L (ref 20–32)
Calcium: 9.9 mg/dL (ref 8.6–10.4)
Chloride: 104 mmol/L (ref 98–110)
Creat: 0.82 mg/dL (ref 0.50–0.99)
GFR, Est African American: 88 mL/min/{1.73_m2} (ref >=60)
GFR, Est Non African American: 76 mL/min/{1.73_m2} (ref >=60)
Globulin: 2.6 g/dL (calc) (ref 1.9–3.7)
Glucose, Bld: 104 mg/dL — ABNORMAL HIGH (ref 65–99)
Potassium: 4.2 mmol/L (ref 3.5–5.3)
Sodium: 140 mmol/L (ref 135–146)
Total Bilirubin: 0.7 mg/dL (ref 0.2–1.2)
Total Protein: 7.2 g/dL (ref 6.1–8.1)

## 2020-10-01 LAB — PROTEIN ELECTROPHORESIS, SERUM, WITH REFLEX
Albumin ELP: 4.6 g/dL (ref 3.8–4.8)
Alpha 1: 0.4 g/dL — ABNORMAL HIGH (ref 0.2–0.3)
Alpha 2: 0.6 g/dL (ref 0.5–0.9)
Beta 2: 0.3 g/dL (ref 0.2–0.5)
Beta Globulin: 0.4 g/dL (ref 0.4–0.6)
Gamma Globulin: 0.9 g/dL (ref 0.8–1.7)
Total Protein: 7.2 g/dL (ref 6.1–8.1)

## 2020-10-01 LAB — CBC WITH DIFFERENTIAL/PLATELET
Absolute Monocytes: 449 cells/uL (ref 200–950)
Basophils Absolute: 33 cells/uL (ref 0–200)
Basophils Relative: 0.5 %
Eosinophils Absolute: 260 cells/uL (ref 15–500)
Eosinophils Relative: 4 %
HCT: 41.6 % (ref 35.0–45.0)
Hemoglobin: 14 g/dL (ref 11.7–15.5)
Lymphs Abs: 2093 cells/uL (ref 850–3900)
MCH: 28.5 pg (ref 27.0–33.0)
MCHC: 33.7 g/dL (ref 32.0–36.0)
MCV: 84.7 fL (ref 80.0–100.0)
MPV: 10.9 fL (ref 7.5–12.5)
Monocytes Relative: 6.9 %
Neutro Abs: 3666 cells/uL (ref 1500–7800)
Neutrophils Relative %: 56.4 %
Platelets: 262 10*3/uL (ref 140–400)
RBC: 4.91 10*6/uL (ref 3.80–5.10)
RDW: 13.2 % (ref 11.0–15.0)
Total Lymphocyte: 32.2 %
WBC: 6.5 10*3/uL (ref 3.8–10.8)

## 2020-10-01 LAB — SEDIMENTATION RATE: Sed Rate: 6 mm/h (ref 0–30)

## 2020-10-01 LAB — SJOGRENS SYNDROME-A EXTRACTABLE NUCLEAR ANTIBODY: SSA (Ro) (ENA) Antibody, IgG: >8 AI — AB

## 2020-10-01 LAB — URIC ACID: Uric Acid, Serum: 5.3 mg/dL (ref 2.5–7.0)

## 2020-10-01 LAB — RHEUMATOID FACTOR: Rheumatoid fact SerPl-aCnc: <14 IU/mL (ref <14)

## 2020-10-01 LAB — ANA: Anti Nuclear Antibody (ANA): POSITIVE — AB

## 2020-10-01 LAB — ANTI-NUCLEAR AB-TITER (ANA TITER): ANA Titer 1: 1:80 {titer} — ABNORMAL HIGH

## 2020-10-02 ENCOUNTER — Ambulatory Visit (INDEPENDENT_AMBULATORY_CARE_PROVIDER_SITE_OTHER): Payer: Managed Care, Other (non HMO) | Admitting: Orthopaedic Surgery

## 2020-10-02 ENCOUNTER — Telehealth: Payer: Self-pay | Admitting: Rheumatology

## 2020-10-02 ENCOUNTER — Encounter: Payer: Self-pay | Admitting: Orthopaedic Surgery

## 2020-10-02 VITALS — BP 138/78 | HR 69 | Ht 66.5 in | Wt 198.0 lb

## 2020-10-02 DIAGNOSIS — M25561 Pain in right knee: Secondary | ICD-10-CM

## 2020-10-02 DIAGNOSIS — M659 Synovitis and tenosynovitis, unspecified: Secondary | ICD-10-CM | POA: Diagnosis not present

## 2020-10-02 MED ORDER — METHYLPREDNISOLONE ACETATE 40 MG/ML IJ SUSP
40.0000 mg | INTRAMUSCULAR | Status: AC | PRN
  Administered 2020-10-02: 40 mg via INTRA_ARTICULAR

## 2020-10-02 MED ORDER — BUPIVACAINE HCL 0.25 % IJ SOLN
4.0000 mL | INTRAMUSCULAR | Status: AC | PRN
  Administered 2020-10-02: 4 mL via INTRA_ARTICULAR

## 2020-10-02 MED ORDER — LIDOCAINE HCL 1 % IJ SOLN
0.5000 mL | INTRAMUSCULAR | Status: AC | PRN
  Administered 2020-10-02: .5 mL

## 2020-10-02 NOTE — Progress Notes (Signed)
Office Visit Note   Patient: [name] [name]           Date of Birth: 27-Apr-1956           MRN: [id number] Visit Date: 10/02/2020              Requested by: [name] Ranch, MD [address] [place],  [place] [address] PCP: [name] Ranch, MD   Assessment & Plan: Visit Diagnoses: knee synovitis with some  osteoarthritis.    Plan: 45 cc of clear yellow fluid aspirated from her knee no particulate debris no cloudiness.  We sent this for cell count and crystals.  I went ahead and injected some cortisone and after aspirating the fluid.  She will work on some stretching of her knee and foot on the coffee table and stretch her knee out to get full extension so she is able to stand and walk her knee as needed.  We discussed Intra-Op chondromalacia changes that were present in her knee likely causing the continued swelling in her knee.  She can return if she has ongoing problems.  Follow-Up Instructions: No follow-ups on file.   Orders:  No orders of the defined types were placed in this encounter.  No orders of the defined types were placed in this encounter.     Procedures: Large Joint Inj: R knee on 10/02/2020 2:38 PM Indications: pain and joint swelling Details: 22 G 1.5 in needle, anterolateral approach  Arthrogram: No  Medications: 40 mg methylPREDNISolone acetate 40 MG/ML; 0.5 mL lidocaine 1 %; 4 mL bupivacaine 0.25 % Aspirate: 45 mL clear and yellow Outcome: tolerated well, no immediate complications Procedure, treatment alternatives, risks and benefits explained, specific risks discussed. Consent was given by the patient. Immediately prior to procedure a time out was called to verify the correct patient, procedure, equipment, support staff and site/side marked as required. Patient was prepped and draped in the usual sterile fashion.       Clinical Data: No additional findings.   Subjective: Chief Complaint  Patient presents with  . Right Knee  - Follow-up    06/15/2020 Right knee arthroscopy, [facility]    HPI patient turns post knee arthroscopy with partial medial meniscectomy and chondroplasty done medially on 06/15/2020.  She has continued to have some pain and swelling in her right knee and lab work done by Dr. Corliss [name] yesterday showed 1:80 with speckled pattern same as previous lab work.  Uric acid 5.3.  Rheumatoid factor less than 14.  ANA positive.  Sjogren's syndrome-A extractable nuclear antibody greater than 8 Pos.     Dr. Corliss [name] requested synovial fluid be obtained from the right knee for cell count and crystals.  Patient is continue to have pain swelling difficulty reaching full extension.  No problems with flexion knee swells more during the day.  Review of Systems updated unchanged.   Objective: Vital Signs: BP 138/78   Pulse 69   Ht 5' 6.5" (1.689 m)   Wt 198 lb (89.8 kg)   BMI 31.48 kg/m   Physical Exam Constitutional:      Appearance: She is well-developed.  HENT:     Head: Normocephalic.     Right Ear: External ear normal.     Left Ear: External ear normal.  Eyes:     Pupils: Pupils are equal, round, and reactive to light.  Neck:     Thyroid: No thyromegaly.     Trachea: No tracheal deviation.  Cardiovascular:  Rate and Rhythm: Normal rate.  Pulmonary:     Effort: Pulmonary effort is normal.  Abdominal:     Palpations: Abdomen is soft.  Skin:    General: Skin is warm and dry.  Neurological:     Mental Status: She is alert and oriented to person, place, and time.  Psychiatric:        Mood and Affect: Mood and affect normal.        Behavior: Behavior normal.     Ortho Exam patient has tenderness along the medial joint line.  She lacks 10 degrees reaching full extension.  3+ knee effusion.  Specialty Comments:  No specialty comments available.  Imaging: No results found.   PMFS History: Patient Active Problem List   Diagnosis Date Noted  . Primary osteoarthritis of both knees  07/30/2018  . Primary osteoarthritis of both hands 07/30/2018  . Foot pain 05/15/2018  . Obstructive sleep apnea on CPAP 02/23/2017  . 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 Snoring 03/25/2015  . 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 Acute pancreatitis 10/26/2011  . Nausea and vomiting in adult 10/26/2011  . Dehydration 10/26/2011  . Gastroenteritis 10/26/2011  . Hypokalemia 10/26/2011  . Elevated liver function tests 10/26/2011  . Acid reflux   . Thyroid disease    Past Medical History:  Diagnosis Date  . Abnormal liver enzymes   . Acid reflux   . Arthritis    back and neck  . Bloating   . Bronchitis   . Chest pain, unspecified   . Diarrhea   . Dyspepsia   . Esophageal reflux   . H/O acute pancreatitis   . H/O hiatal hernia   . Headache(784.0)    migraines  . Hemorrhage of rectum and anus   . Hypercholesteremia   . Nausea   . Sjogren's disease (HCC)   . Spinal stenosis, cervical region   . Thyroid disease   . Thyroid nodule     Family History  Problem Relation Age of Onset  . Colon cancer Mother   . Pancreatic cancer Brother   . Healthy Son   . Healthy Daughter     Past Surgical History:  Procedure Laterality Date  . ABDOMINAL HYSTERECTOMY  2007   total with BSO  . CHOLECYSTECTOMY    . COLONOSCOPY    . ECTOPIC PREGNANCY SURGERY    . ESOPHAGOGASTRODUODENOSCOPY    . EUS  12/21/2011   Procedure: ESOPHAGEAL ENDOSCOPIC ULTRASOUND (EUS) RADIAL;  Surgeon: [name] Modena, MD;  Location: [facility];  Service: Endoscopy;  Laterality: N/A;  . eye cautery    . EYE SURGERY     lasik  . EYE SURGERY Left    cataract extraction   . KNEE ARTHROSCOPY Right 06/11/2020   torn meniscus    Social History   Occupational History  . Not on file  Tobacco Use  . Smoking status: Never Smoker  . Smokeless tobacco: Never Used  Vaping Use  . Vaping Use: Never used  Substance and Sexual Activity  . Alcohol use: Yes     Comment: rarely   . Drug use: No  . Sexual activity: Yes    Birth control/protection: None

## 2020-10-02 NOTE — Telephone Encounter (Signed)
Patient calling to check on status of two or three labs that you were waiting on to result when you spoke to her last, on 10/01/2019. Patient request a call to advise, once you receive results

## 2020-10-02 NOTE — Telephone Encounter (Signed)
Patient advised Dr. Corliss Skains has received the remaining lab results and they are currently in her box to review. Patient advised once reviewed we will call her with results.

## 2020-10-02 NOTE — Addendum Note (Signed)
Addended by: Rogers Seeds on: 10/02/2020 03:21 PM   Modules accepted: Orders

## 2020-10-03 LAB — SYNOVIAL FLUID ANALYSIS, COMPLETE
Basophils, %: 0 %
Eosinophils-Synovial: 0 % (ref 0–2)
Lymphocytes-Synovial Fld: 43 % (ref 0–74)
Monocyte/Macrophage: 44 % (ref 0–69)
Neutrophil, Synovial: 13 % (ref 0–24)
Synoviocytes, %: 0 % (ref 0–15)
WBC, Synovial: 126 cells/uL (ref <150)

## 2020-10-03 LAB — TIQ-NTM

## 2020-10-03 NOTE — Progress Notes (Signed)
Positive ANA and positive Ro antibody noted which are unchanged and stable.

## 2020-10-07 NOTE — Progress Notes (Signed)
I called results to pt. Post injection knee " 100% better "

## 2020-10-14 ENCOUNTER — Other Ambulatory Visit: Payer: Self-pay | Admitting: Obstetrics and Gynecology

## 2020-10-14 DIAGNOSIS — Z1231 Encounter for screening mammogram for malignant neoplasm of breast: Secondary | ICD-10-CM

## 2020-12-03 ENCOUNTER — Ambulatory Visit
Admission: RE | Admit: 2020-12-03 | Discharge: 2020-12-03 | Disposition: A | Discharge status: Home / Self Care | Payer: Managed Care, Other (non HMO) | Source: Ambulatory Visit | Attending: Obstetrics and Gynecology | Admitting: Obstetrics and Gynecology

## 2020-12-03 ENCOUNTER — Other Ambulatory Visit: Payer: Self-pay

## 2020-12-03 DIAGNOSIS — Z1231 Encounter for screening mammogram for malignant neoplasm of breast: Secondary | ICD-10-CM

## 2021-01-09 ENCOUNTER — Emergency Department (HOSPITAL_COMMUNITY): Payer: Managed Care, Other (non HMO)

## 2021-01-09 ENCOUNTER — Emergency Department (HOSPITAL_COMMUNITY)
Admission: EM | Admit: 2021-01-09 | Discharge: 2021-01-09 | Disposition: A | Discharge status: Home / Self Care | Payer: Managed Care, Other (non HMO) | Attending: Emergency Medicine | Admitting: Emergency Medicine

## 2021-01-09 ENCOUNTER — Encounter (HOSPITAL_COMMUNITY): Payer: Self-pay

## 2021-01-09 ENCOUNTER — Other Ambulatory Visit: Payer: Self-pay

## 2021-01-09 DIAGNOSIS — R55 Syncope and collapse: Secondary | ICD-10-CM | POA: Insufficient documentation

## 2021-01-09 DIAGNOSIS — R109 Unspecified abdominal pain: Secondary | ICD-10-CM | POA: Insufficient documentation

## 2021-01-09 DIAGNOSIS — R140 Abdominal distension (gaseous): Secondary | ICD-10-CM | POA: Diagnosis not present

## 2021-01-09 DIAGNOSIS — R197 Diarrhea, unspecified: Secondary | ICD-10-CM | POA: Diagnosis not present

## 2021-01-09 LAB — COMPREHENSIVE METABOLIC PANEL
ALT: 25 U/L (ref 0–44)
AST: 33 U/L (ref 15–41)
Albumin: 3.6 g/dL (ref 3.5–5.0)
Alkaline Phosphatase: 64 U/L (ref 38–126)
Anion gap: 5 (ref 5–15)
BUN: 14 mg/dL (ref 8–23)
CO2: 20 mmol/L — ABNORMAL LOW (ref 22–32)
Calcium: 7.5 mg/dL — ABNORMAL LOW (ref 8.9–10.3)
Chloride: 115 mmol/L — ABNORMAL HIGH (ref 98–111)
Creatinine, Ser: 0.61 mg/dL (ref 0.44–1.00)
GFR, Estimated: >60 mL/min (ref >60)
Glucose, Bld: 99 mg/dL (ref 70–99)
Potassium: 4.1 mmol/L (ref 3.5–5.1)
Sodium: 140 mmol/L (ref 135–145)
Total Bilirubin: 1.2 mg/dL (ref 0.3–1.2)
Total Protein: 6.3 g/dL — ABNORMAL LOW (ref 6.5–8.1)

## 2021-01-09 LAB — CBC WITH DIFFERENTIAL/PLATELET
Abs Immature Granulocytes: 0.05 10*3/uL (ref 0.00–0.07)
Basophils Absolute: 0 10*3/uL (ref 0.0–0.1)
Basophils Relative: 0 %
Eosinophils Absolute: 0.1 10*3/uL (ref 0.0–0.5)
Eosinophils Relative: 0 %
HCT: 42 % (ref 36.0–46.0)
Hemoglobin: 14 g/dL (ref 12.0–15.0)
Immature Granulocytes: 0 %
Lymphocytes Relative: 5 %
Lymphs Abs: 0.7 10*3/uL (ref 0.7–4.0)
MCH: 28.1 pg (ref 26.0–34.0)
MCHC: 33.3 g/dL (ref 30.0–36.0)
MCV: 84.3 fL (ref 80.0–100.0)
Monocytes Absolute: 0.5 10*3/uL (ref 0.1–1.0)
Monocytes Relative: 4 %
Neutro Abs: 12.4 10*3/uL — ABNORMAL HIGH (ref 1.7–7.7)
Neutrophils Relative %: 91 %
Platelets: 246 10*3/uL (ref 150–400)
RBC: 4.98 MIL/uL (ref 3.87–5.11)
RDW: 13.7 % (ref 11.5–15.5)
WBC: 13.7 10*3/uL — ABNORMAL HIGH (ref 4.0–10.5)
nRBC: 0 % (ref 0.0–0.2)

## 2021-01-09 LAB — URINALYSIS, ROUTINE W REFLEX MICROSCOPIC
Bacteria, UA: NONE SEEN
Bilirubin Urine: NEGATIVE
Glucose, UA: NEGATIVE mg/dL
Ketones, ur: NEGATIVE mg/dL
Leukocytes,Ua: NEGATIVE
Nitrite: NEGATIVE
Protein, ur: NEGATIVE mg/dL
Specific Gravity, Urine: 1.018 (ref 1.005–1.030)
pH: 5 (ref 5.0–8.0)

## 2021-01-09 LAB — CBG MONITORING, ED: Glucose-Capillary: 93 mg/dL (ref 70–99)

## 2021-01-09 LAB — LIPASE, BLOOD: Lipase: 32 U/L (ref 11–51)

## 2021-01-09 MED ORDER — SODIUM CHLORIDE 0.9 % IV BOLUS
1000.0000 mL | Freq: Once | INTRAVENOUS | Status: AC
  Administered 2021-01-09: 1000 mL via INTRAVENOUS

## 2021-01-09 MED ORDER — DICYCLOMINE HCL 20 MG PO TABS: 20.0000 mg | ORAL_TABLET | Freq: Two times a day (BID) | ORAL | 0 refills | Status: DC

## 2021-01-09 MED ORDER — SODIUM CHLORIDE 0.9 % IV BOLUS
500.0000 mL | Freq: Once | INTRAVENOUS | Status: AC
  Administered 2021-01-09: 500 mL via INTRAVENOUS

## 2021-01-09 MED ORDER — IOHEXOL 300 MG/ML  SOLN
100.0000 mL | Freq: Once | INTRAMUSCULAR | Status: AC | PRN
  Administered 2021-01-09: 100 mL via INTRAVENOUS

## 2021-01-09 NOTE — Discharge Instructions (Addendum)
Recheck with your primary care provider on Monday.  Return to the emergency room for new or worsening symptoms.

## 2021-01-09 NOTE — ED Notes (Signed)
Family at bedside. 

## 2021-01-09 NOTE — ED Triage Notes (Addendum)
Patient BIB [facility] from home with a syncopal episode tonight, husband eased patient to the floor. The past 4 months patient has had weird bowel movements. Doctor started patient on Citrucel and patient has had diarrhea, little to no oral intake. Patient got really hot before syncopal episode and then passed out. Positive for orthostatic changes according to EMS.   EMS 22G right wrist 4mg  Zofran  108/64 HR 74 97% Room air 115 CBG

## 2021-01-09 NOTE — ED Notes (Signed)
Waiting on fluid bolus to finish per Provider before d/c

## 2021-01-09 NOTE — ED Notes (Signed)
Patient attempted to provide stool sample. Unable to at this moment.

## 2021-01-09 NOTE — ED Provider Notes (Signed)
[facility]-EMERGENCY DEPT Provider Note   CSN: [id number] Arrival date & time: 01/09/21  0429     History Chief Complaint  Patient presents with  . Diarrhea  . Syncope    [name] [name] is a 65 y.o. female.  65 year old female brought in by EMS from home for syncopal episode x 1. Patient states she has had a bloating abdominal discomfort for a while, went to see her GI who advised her to take Citrucel which she has been taking for 3-4 days. Patient woke up early this morning to go to the bathroom and felt like she was going to pass out, called for her husband who states she went limp and she helped her to the ground, states she was out for less than a minute. EMS reports patient to be orthostatic on their assessment. Patient reports loose stools, decreased oral intake for the past few days. No changes in bladder habits, denies fevers but states she had chills and sweats prior to passing out.  Prior abdominal surgeries include umbilical hernia repair, cholecystectomy, hysterectomy.        Past Medical History:  Diagnosis Date  . Abnormal liver enzymes   . Acid reflux   . Arthritis    back and neck  . Bloating   . Bronchitis   . Chest pain, unspecified   . Diarrhea   . Dyspepsia   . Esophageal reflux   . H/O acute pancreatitis   . H/O hiatal hernia   . Headache(784.0)    migraines  . Hemorrhage of rectum and anus   . Hypercholesteremia   . Nausea   . Sjogren's disease (HCC)   . Spinal stenosis, cervical region   . Thyroid disease   . Thyroid nodule     Patient Active Problem List   Diagnosis Date Noted  . Primary osteoarthritis of both knees 07/30/2018  . Primary osteoarthritis of both hands 07/30/2018  . Foot pain 05/15/2018  . Obstructive sleep apnea on CPAP 02/23/2017  . 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 Snoring 03/25/2015  . 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 Acute pancreatitis  10/26/2011  . Nausea and vomiting in adult 10/26/2011  . Dehydration 10/26/2011  . Gastroenteritis 10/26/2011  . Hypokalemia 10/26/2011  . Elevated liver function tests 10/26/2011  . Acid reflux   . Thyroid disease     Past Surgical History:  Procedure Laterality Date  . ABDOMINAL HYSTERECTOMY  2007   total with BSO  . CHOLECYSTECTOMY    . COLONOSCOPY    . ECTOPIC PREGNANCY SURGERY    . ESOPHAGOGASTRODUODENOSCOPY    . EUS  12/21/2011   Procedure: ESOPHAGEAL ENDOSCOPIC ULTRASOUND (EUS) RADIAL;  Surgeon: Willis Modena, MD;  Location: [facility];  Service: Endoscopy;  Laterality: N/A;  . eye cautery    . EYE SURGERY     lasik  . EYE SURGERY Left    cataract extraction   . KNEE ARTHROSCOPY Right 06/11/2020   torn meniscus      OB History   No obstetric history on file.     Family History  Problem Relation Age of Onset  . Colon cancer Mother   . Pancreatic cancer Brother   . Healthy Son   . Healthy Daughter     Social History   Tobacco Use  . Smoking status: Never Smoker  . Smokeless tobacco: Never Used  Vaping Use  . Vaping Use: Never used  Substance Use Topics  .  Alcohol use: Yes    Comment: rarely   . Drug use: No    Home Medications Prior to Admission medications   Medication Sig Start Date End Date Taking? Authorizing Provider  dicyclomine (BENTYL) 20 MG tablet Take 1 tablet (20 mg total) by mouth 2 (two) times daily. 01/09/21  Yes Jeannie Fend, PA-C  Ascorbic Acid (VITAMIN C) 1000 MG tablet Take 1,000 mg by mouth daily.    [provider]  Cyanocobalamin (VITAMIN B 12 PO) Take by mouth.    [provider]  Multiple Vitamin (MULTIVITAMIN) capsule Take 1 capsule by mouth daily.    [provider]    Allergies    Morphine and related, Other, and Sulfa antibiotics  Review of Systems   Review of Systems  Constitutional: Positive for chills and diaphoresis. Negative for fever.  Respiratory: Negative for shortness of  breath.   Cardiovascular: Negative for chest pain.  Gastrointestinal: Positive for abdominal pain and diarrhea. Negative for abdominal distention, blood in stool, constipation, nausea and vomiting.  Genitourinary: Negative for dysuria.  Musculoskeletal: Negative for arthralgias and myalgias.  Skin: Negative for rash and wound.  Allergic/Immunologic: Negative for immunocompromised state.  Neurological: Positive for syncope.  Psychiatric/Behavioral: Negative for confusion.  All other systems reviewed and are negative.   Physical Exam Updated Vital Signs BP (!) 147/74   Pulse 88   Temp 98.9 F (37.2 C) (Oral)   Resp 13   Ht 5' 6.5" (1.689 m)   Wt 89.8 kg   SpO2 95%   BMI 31.47 kg/m   Physical Exam Vitals and nursing note reviewed.  Constitutional:      General: She is not in acute distress.    Appearance: She is well-developed. She is not diaphoretic.  HENT:     Head: Normocephalic and atraumatic.  Cardiovascular:     Rate and Rhythm: Normal rate.     Pulses: Normal pulses.     Heart sounds: Normal heart sounds.  Pulmonary:     Effort: Pulmonary effort is normal.     Breath sounds: Normal breath sounds.  Abdominal:     General: Bowel sounds are absent.     Palpations: Abdomen is soft.     Tenderness: There is no abdominal tenderness.  Musculoskeletal:     Right lower leg: No edema.     Left lower leg: No edema.  Skin:    General: Skin is warm and dry.     Findings: No erythema or rash.  Neurological:     Mental Status: She is alert and oriented to person, place, and time.  Psychiatric:        Behavior: Behavior normal.     ED Results / Procedures / Treatments   Labs (all labs ordered are listed, but only abnormal results are displayed) Labs Reviewed  CBC WITH DIFFERENTIAL/PLATELET - Abnormal; Notable for the following components:      Result Value   WBC 13.7 (*)    Neutro Abs 12.4 (*)    All other components within normal limits  URINALYSIS, ROUTINE W  REFLEX MICROSCOPIC - Abnormal; Notable for the following components:   Hgb urine dipstick MODERATE (*)    All other components within normal limits  COMPREHENSIVE METABOLIC PANEL - Abnormal; Notable for the following components:   Chloride 115 (*)    CO2 20 (*)    Calcium 7.5 (*)    Total Protein 6.3 (*)    All other components within normal limits  GASTROINTESTINAL PANEL  BY PCR, STOOL (REPLACES STOOL CULTURE)  C DIFFICILE QUICK SCREEN W PCR REFLEX  LIPASE, BLOOD  CBG MONITORING, ED    EKG EKG Interpretation  Date/Time:  Saturday Jan 09 2021 04:41:46 EDT Ventricular Rate:  69 PR Interval:  202 QRS Duration: 90 QT Interval:  384 QTC Calculation: 412 R Axis:   38 Text Interpretation: Sinus rhythm No significant change was found Confirmed by Paula Libra ([id number]) on 01/09/2021 5:24:38 AM   Radiology CT Abdomen Pelvis W Contrast  Result Date: 01/09/2021 CLINICAL DATA:  Generalized abdominal pain with syncopal episode last night and altered bowel movements. EXAM: CT ABDOMEN AND PELVIS WITH CONTRAST TECHNIQUE: Multidetector CT imaging of the abdomen and pelvis was performed using the standard protocol following bolus administration of intravenous contrast. CONTRAST:  OMNIPAQUE IOHEXOL 300 MG/ML  SOLN COMPARISON:  12/30/2015 FINDINGS: Lower chest: Lung bases are clear. Hepatobiliary: Previous cholecystectomy. Couple subcentimeter liver hypodensities too small to characterize but likely cysts. Biliary tree is normal. Pancreas: Normal. Spleen: Normal. Adrenals/Urinary Tract: Adrenal glands are normal. Kidneys are normal in size without hydronephrosis or nephrolithiasis. Bilateral renal cysts unchanged. Ureters and bladder are normal. Stomach/Bowel: Stomach and small bowel are normal. Colon is decompressed distal to the splenic flexure. Appendix is normal. Vascular/Lymphatic: Abdominal aorta is normal in caliber. Remaining vascular structures are unremarkable. No adenopathy. Reproductive:  Previous hysterectomy.  Adnexal regions unremarkable. Other: No free fluid or focal inflammatory change. Musculoskeletal: No acute findings. IMPRESSION: 1. No acute findings in the abdomen/pelvis. 2. Couple subcentimeter liver hypodensities too small to characterize, but likely cysts. 3. Bilateral renal cysts unchanged. Electronically Signed   By: Elberta Fortis M.D.   On: 01/09/2021 12:40    Procedures Procedures   Medications Ordered in ED Medications  sodium chloride 0.9 % bolus 1,000 mL (0 mLs Intravenous Stopped 01/09/21 0900)  iohexol (OMNIPAQUE) 300 MG/ML solution 100 mL (100 mLs Intravenous Contrast Given 01/09/21 1218)  sodium chloride 0.9 % bolus 500 mL (500 mLs Intravenous New Bag/Given 01/09/21 1233)    ED Course  I have reviewed the triage vital signs and the nursing notes.  Pertinent labs & imaging results that were available during my care of the patient were reviewed by me and considered in my medical decision making (see chart for details).  Clinical Course as of 01/09/21 1310  Sat Jan 09, 2021  [id number] 65 year old female brought in by EMS after what sounds like a possible vasovagal syncopal episode at home today.  Patient's condition has improved while awaiting her work-up in the ER today.  Labs with leukocytosis with white count of 13.7, predominantly neutrophils, CMP without significant changes.  Urinalysis with moderate hemoglobin without evidence of infection and lipase within normal notes.  Patient was given IV fluids, CT abdomen pelvis obtained due to ongoing abdominal pain and is unremarkable.  Discussed results with patient who plans to follow-up with her GI, will try Bentyl in the meantime. [LM]    Clinical Course User Index [LM] Alden Hipp   MDM Rules/Calculators/A&P                          Final Clinical Impression(s) / ED Diagnoses Final diagnoses:  Vasovagal syncope    Rx / DC Orders ED Discharge Orders         Ordered    dicyclomine (BENTYL) 20 MG  tablet  2 times daily        01/09/21 1308  Jeannie FendMurphy, Maecie Sevcik A, PA-C 01/09/21 1310    Margarita Grizzleay, Danielle, MD 01/09/21 [phone]

## 2021-03-02 ENCOUNTER — Ambulatory Visit: Payer: Managed Care, Other (non HMO) | Admitting: Orthopaedic Surgery

## 2021-03-16 NOTE — Progress Notes (Deleted)
Office Visit Note  Patient: [name] [name]             Date of Birth: Jan 23, 1956           MRN: [id number]             PCP: Pcp, No Referring: [place], * Visit Date: 03/30/2021 Occupation: @GUAROCC @  Subjective:  No chief complaint on file.   History of Present Illness: [name] [name] is a 65 y.o. female ***   Activities of Daily Living:  Patient reports morning stiffness for *** {minute/hour:19697}.   Patient {ACTIONS;DENIES/REPORTS:21021675::"Denies"} nocturnal pain.  Difficulty dressing/grooming: {ACTIONS;DENIES/REPORTS:21021675::"Denies"} Difficulty climbing stairs: {ACTIONS;DENIES/REPORTS:21021675::"Denies"} Difficulty getting out of chair: {ACTIONS;DENIES/REPORTS:21021675::"Denies"} Difficulty using hands for taps, buttons, cutlery, and/or writing: {ACTIONS;DENIES/REPORTS:21021675::"Denies"}  No Rheumatology ROS completed.   PMFS History:  Patient Active Problem List   Diagnosis Date Noted  . Primary osteoarthritis of both knees 07/30/2018  . Primary osteoarthritis of both hands 07/30/2018  . Foot pain 05/15/2018  . Obstructive sleep apnea on CPAP 02/23/2017  . 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 Snoring 03/25/2015  . 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 Acute pancreatitis 10/26/2011  . Nausea and vomiting in adult 10/26/2011  . Dehydration 10/26/2011  . Gastroenteritis 10/26/2011  . Hypokalemia 10/26/2011  . Elevated liver function tests 10/26/2011  . Acid reflux   . Thyroid disease     Past Medical History:  Diagnosis Date  . Abnormal liver enzymes   . Acid reflux   . Arthritis    back and neck  . Bloating   . Bronchitis   . Chest pain, unspecified   . Diarrhea   . Dyspepsia   . Esophageal reflux   . H/O acute pancreatitis   . H/O hiatal hernia   . Headache(784.0)    migraines  . Hemorrhage of rectum and anus   . Hypercholesteremia   . Nausea   . Sjogren's disease (HCC)   .  Spinal stenosis, cervical region   . Thyroid disease   . Thyroid nodule     Family History  Problem Relation Age of Onset  . Colon cancer Mother   . Pancreatic cancer Brother   . Healthy Son   . Healthy Daughter    Past Surgical History:  Procedure Laterality Date  . ABDOMINAL HYSTERECTOMY  2007   total with BSO  . CHOLECYSTECTOMY    . COLONOSCOPY    . ECTOPIC PREGNANCY SURGERY    . ESOPHAGOGASTRODUODENOSCOPY    . EUS  12/21/2011   Procedure: ESOPHAGEAL ENDOSCOPIC ULTRASOUND (EUS) RADIAL;  Surgeon: 12/23/2011, MD;  Location: [facility];  Service: Endoscopy;  Laterality: N/A;  . eye cautery    . EYE SURGERY     lasik  . EYE SURGERY Left    cataract extraction   . KNEE ARTHROSCOPY Right 06/11/2020   torn meniscus    Social History   Social History Narrative   Married.   Drinks occasional sweet tea.   Works at 08/11/2020 as a [employer].   Immunization History  Administered Date(s) Administered  . PFIZER(Purple Top)SARS-COV-2 Vaccination 11/22/2019, 12/13/2019, 07/01/2020     Objective: Vital Signs: There were no vitals taken for this visit.   Physical Exam   Musculoskeletal Exam: ***  CDAI Exam: CDAI Score: -- Patient Global: --; Provider Global: -- Swollen: --; Tender: -- Joint Exam 03/30/2021   No joint exam has been documented for this visit   There is currently no  information documented on the homunculus. Go to the Rheumatology activity and complete the homunculus joint exam.  Investigation: No additional findings.  Imaging: No results found.  Recent Labs: Lab Results  Component Value Date   WBC 13.7 (H) 01/09/2021   HGB 14.0 01/09/2021   PLT 246 01/09/2021   NA 140 01/09/2021   K 4.1 01/09/2021   CL 115 (H) 01/09/2021   CO2 20 (L) 01/09/2021   GLUCOSE 99 01/09/2021   BUN 14 01/09/2021   CREATININE 0.61 01/09/2021   BILITOT 1.2 01/09/2021   ALKPHOS 64 01/09/2021   AST 33 01/09/2021   ALT 25 01/09/2021   PROT  6.3 (L) 01/09/2021   ALBUMIN 3.6 01/09/2021   CALCIUM 7.5 (L) 01/09/2021   GFRAA 88 09/29/2020    Speciality Comments: PLQ eye exam: 08/27/2019 normal. [facility]. Follow up in 6 months. PLQ started 07/2019  Procedures:  No procedures performed Allergies: Morphine and related, Other, and Sulfa antibiotics   Assessment / Plan:     Visit Diagnoses: No diagnosis found.  Orders: No orders of the defined types were placed in this encounter.  No orders of the defined types were placed in this encounter.   Face-to-face time spent with patient was *** minutes. Greater than 50% of time was spent in counseling and coordination of care.  Follow-Up Instructions: No follow-ups on file.   Ellen Henri, CMA  Note - This record has been created using Animal nutritionist.  Chart creation errors have been sought, but may not always  have been located. Such creation errors do not reflect on  the standard of medical care.

## 2021-03-23 ENCOUNTER — Encounter: Payer: Self-pay | Admitting: Orthopaedic Surgery

## 2021-03-23 ENCOUNTER — Ambulatory Visit (INDEPENDENT_AMBULATORY_CARE_PROVIDER_SITE_OTHER): Payer: Managed Care, Other (non HMO)

## 2021-03-23 ENCOUNTER — Ambulatory Visit (INDEPENDENT_AMBULATORY_CARE_PROVIDER_SITE_OTHER): Payer: Managed Care, Other (non HMO) | Admitting: Orthopaedic Surgery

## 2021-03-23 VITALS — BP 150/88 | HR 71 | Ht 66.0 in | Wt 190.0 lb

## 2021-03-23 DIAGNOSIS — M25561 Pain in right knee: Secondary | ICD-10-CM

## 2021-03-23 DIAGNOSIS — G8929 Other chronic pain: Secondary | ICD-10-CM | POA: Diagnosis not present

## 2021-03-23 DIAGNOSIS — M170 Bilateral primary osteoarthritis of knee: Secondary | ICD-10-CM | POA: Diagnosis not present

## 2021-03-23 NOTE — Progress Notes (Signed)
Office Visit Note   Patient: [name] [name]           Date of Birth: [birth year]           MRN: [id number] Visit Date: 03/23/2021              Requested by: No referring provider defined for this encounter. PCP: Pcp, No   Assessment & Plan: Visit Diagnoses:  1. Chronic pain of right knee   2. Primary osteoarthritis of both knees     Plan: Reviewed x-rays and discussed activity.  She is walking fairly well does not have full extension.  She is gotten some improvement with the knee arthroscopy with partial meniscectomy but does have knee osteoarthritis and again we discussed indications and procedure for total knee arthroplasty that would be required at some point in the future.  We will check her back again in 4 months.  Follow-Up Instructions: Return in about 4 months (around 07/24/2021).   Orders:  Orders Placed This Encounter  Procedures   XR KNEE 3 VIEW RIGHT   No orders of the defined types were placed in this encounter.     Procedures: No procedures performed   Clinical Data: No additional findings.   Subjective: Chief Complaint  Patient presents with   Right Knee - Pain    HPI 65 year old female returns for follow-up post knee injection in January with right knee posterior medial meniscectomy and femoral chondroplasty 06/15/2020.  She states she noticed some mild genu valgum of her right knee that she notices when she walks she turns her foot and external rotation since she does not get complete full extension.  She is amatory without a limp.  Review of Systems 14 point system update unchanged from 10/02/2020 other than as mentioned above.   Objective: Vital Signs: BP (!) 150/88   Pulse 71   Ht 5\' 6"  (1.676 m)   Wt 190 lb (86.2 kg)   BMI 30.67 kg/m   Physical Exam  Ortho Exam  Specialty Comments:  No specialty comments available.  Imaging: No results found.   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   Snoring 03/25/2015   Hypersomnia with sleep apnea 03/25/2015   Multinodular goiter (nontoxic) 02/08/2012   Acute pancreatitis 10/26/2011   Nausea and vomiting in adult 10/26/2011   Dehydration 10/26/2011   Gastroenteritis 10/26/2011   Hypokalemia 10/26/2011   Elevated liver function tests 10/26/2011   Acid reflux    Thyroid disease    Past Medical History:  Diagnosis Date   Abnormal liver enzymes    Acid reflux    Arthritis    back and neck   Bloating    Bronchitis    Chest pain, unspecified    Diarrhea    Dyspepsia    Esophageal reflux    H/O acute pancreatitis    H/O hiatal hernia    Headache(784.0)    migraines   Hemorrhage of rectum and anus    Hypercholesteremia    Nausea    Sjogren's disease (HCC)    Spinal stenosis, cervical region    Thyroid disease    Thyroid nodule     Family History  Problem Relation Age of Onset   Colon cancer Mother    Pancreatic cancer Brother  Healthy Son    Healthy Daughter     Past Surgical History:  Procedure Laterality Date   ABDOMINAL HYSTERECTOMY  2007   total with BSO   CHOLECYSTECTOMY     COLONOSCOPY     ECTOPIC PREGNANCY SURGERY     ESOPHAGOGASTRODUODENOSCOPY     EUS  12/21/2011   Procedure: ESOPHAGEAL ENDOSCOPIC ULTRASOUND (EUS) RADIAL;  Surgeon: Willis Modena, MD;  Location: [facility];  Service: Endoscopy;  Laterality: N/A;   eye cautery     EYE SURGERY     lasik   EYE SURGERY Left    cataract extraction    KNEE ARTHROSCOPY Right 06/11/2020   torn meniscus    Social History   Occupational History   Not on file  Tobacco Use   Smoking status: Never   Smokeless tobacco: Never  Vaping Use   Vaping Use: Never used  Substance and Sexual Activity   Alcohol use: Yes    Comment: rarely    Drug use: No   Sexual  activity: Yes    Birth control/protection: None

## 2021-03-29 ENCOUNTER — Telehealth: Payer: Self-pay

## 2021-03-29 NOTE — Telephone Encounter (Signed)
Patient requested a return call to let her know if Dr. Corliss Skains can add Cholesterol test to her labwork.  Patient states her PCP has retired and has appointment scheduled with Ladona Ridgel on 05/21/21 at 11:15.  Patient states if she can have that test added she will reschedule to an earlier appointment.

## 2021-03-29 NOTE — Telephone Encounter (Signed)
Ok to add lipid panel

## 2021-03-29 NOTE — Telephone Encounter (Signed)
Patient advised we can add lipid panel. Patient changed appointment time on 05/21/2021 from 10:20 am to 8:00 am. Note made on appointment to draw lipid panel.

## 2021-03-30 ENCOUNTER — Ambulatory Visit: Payer: Managed Care, Other (non HMO) | Admitting: Physician Assistant

## 2021-03-30 DIAGNOSIS — M3509 Sicca syndrome with other organ involvement: Secondary | ICD-10-CM

## 2021-03-30 DIAGNOSIS — M170 Bilateral primary osteoarthritis of knee: Secondary | ICD-10-CM

## 2021-03-30 DIAGNOSIS — R768 Other specified abnormal immunological findings in serum: Secondary | ICD-10-CM

## 2021-03-30 DIAGNOSIS — Z8669 Personal history of other diseases of the nervous system and sense organs: Secondary | ICD-10-CM

## 2021-03-30 DIAGNOSIS — M19041 Primary osteoarthritis, right hand: Secondary | ICD-10-CM

## 2021-03-30 DIAGNOSIS — R2689 Other abnormalities of gait and mobility: Secondary | ICD-10-CM

## 2021-03-30 DIAGNOSIS — Z79899 Other long term (current) drug therapy: Secondary | ICD-10-CM

## 2021-03-30 DIAGNOSIS — M25461 Effusion, right knee: Secondary | ICD-10-CM

## 2021-03-30 DIAGNOSIS — Z8639 Personal history of other endocrine, nutritional and metabolic disease: Secondary | ICD-10-CM

## 2021-04-26 LAB — HM COLONOSCOPY

## 2021-05-07 NOTE — Progress Notes (Signed)
Office Visit Note  Patient: [name] [name]             Date of Birth: [birth year]           MRN: [id number]             PCP: Pcp, No Referring: Lanice Shirts, * Visit Date: 05/21/2021 Occupation: @GUAROCC @  Subjective:  Joint stiffness   History of Present Illness: [name] [name] is a 65 y.o. female with history of Sjogren's syndrome and osteoarthritis.  She is not currently taking any immunosuppressive agents.  She was previously on Plaquenil but discontinued after 3 months due to inadequate response.  Patient reports that she continues to have chronic sicca symptoms on a daily basis.  She has not been using any eyedrops recently since she has had an adequate response to Restasis and eyedrop in the past as well as over-the-counter products.  She continues to see ophthalmologist on a yearly basis.  Overall her oral dryness has been tolerable.  She continues to see the dentist every 6 months and denies any recent cavities.  She denies any shortness of breath or pleuritic chest pain.  She continues to experience joint stiffness especially after sitting for prolonged periods of time.  She experiences intermittent pain and swelling in the right knee joint.  She had arthroscopic surgery in the past performed by Dr. Lorin Mercy and continues to follow-up with them as needed.  She has had cortisone injections in the right knee in the past. She denies any recent falls.     Activities of Daily Living:  Patient reports morning stiffness for 5 minutes.   Patient Denies nocturnal pain.  Difficulty dressing/grooming: Denies Difficulty climbing stairs: Denies Difficulty getting out of chair: Denies Difficulty using hands for taps, buttons, cutlery, and/or writing: Denies  Review of Systems  Constitutional:  Negative for fatigue.  HENT:  Positive for mouth dryness. Negative for mouth sores and nose dryness.   Eyes:  Positive for dryness. Negative for pain and itching.  Respiratory:  Negative for  shortness of breath and difficulty breathing.   Cardiovascular:  Positive for palpitations. Negative for chest pain.  Gastrointestinal:  Negative for blood in stool, constipation and diarrhea.  Endocrine: Negative for increased urination.  Genitourinary:  Negative for difficulty urinating.  Musculoskeletal:  Positive for muscle weakness and morning stiffness. Negative for joint pain, joint pain, joint swelling, myalgias, muscle tenderness and myalgias.  Skin:  Positive for color change. Negative for rash and redness.  Allergic/Immunologic: Negative for susceptible to infections.  Neurological:  Positive for numbness. Negative for dizziness, headaches, memory loss and weakness.  Hematological:  Negative for bruising/bleeding tendency.  Psychiatric/Behavioral:  Negative for confusion.    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   Snoring 03/25/2015   Hypersomnia with sleep apnea 03/25/2015   Multinodular goiter (nontoxic) 02/08/2012   Acute pancreatitis 10/26/2011   Nausea and vomiting in adult 10/26/2011   Dehydration 10/26/2011   Gastroenteritis 10/26/2011   Hypokalemia 10/26/2011   Elevated liver function tests 10/26/2011   Acid reflux    Thyroid disease     Past Medical History:  Diagnosis Date   Abnormal liver enzymes    Acid reflux    Arthritis    back and neck  Bloating    Bronchitis    Chest pain, unspecified    Diarrhea    Dyspepsia    Esophageal reflux    H/O acute pancreatitis    H/O hiatal hernia    Headache(784.0)    migraines   Hemorrhage of rectum and anus    Hypercholesteremia    Nausea    Sjogren's disease (HCC)    Spinal stenosis, cervical region    Thyroid disease    Thyroid nodule     Family History  Problem Relation  Age of Onset   Colon cancer Mother    Pancreatic cancer Brother    Healthy Son    Healthy Daughter    Past Surgical History:  Procedure Laterality Date   ABDOMINAL HYSTERECTOMY  2007   total with BSO   CHOLECYSTECTOMY     COLONOSCOPY     ECTOPIC PREGNANCY SURGERY     ESOPHAGOGASTRODUODENOSCOPY     EUS  12/21/2011   Procedure: ESOPHAGEAL ENDOSCOPIC ULTRASOUND (EUS) RADIAL;  Surgeon: Arta Silence, MD;  Location: [facility];  Service: Endoscopy;  Laterality: N/A;   eye cautery     EYE SURGERY     lasik   EYE SURGERY Left    cataract extraction    KNEE ARTHROSCOPY Right 06/11/2020   torn meniscus    Social History   Social History Narrative   Married.   Drinks occasional sweet tea.   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   Immunization History  Administered Date(s) Administered   PFIZER(Purple Top)SARS-COV-2 Vaccination 11/22/2019, 12/13/2019, 07/01/2020, 03/26/2021     Objective: Vital Signs: BP (!) 145/88 (BP Location: Left Arm, Patient Position: Sitting, Cuff Size: Normal)   Pulse 67   Ht 5' 6"  (1.676 m)   Wt 196 lb 9.6 oz (89.2 kg)   BMI 31.73 kg/m    Physical Exam Vitals and nursing note reviewed.  Constitutional:      Appearance: She is well-developed.  HENT:     Head: Normocephalic and atraumatic.  Eyes:     Conjunctiva/sclera: Conjunctivae normal.  Cardiovascular:     Rate and Rhythm: Normal rate and regular rhythm.     Heart sounds: Normal heart sounds.  Pulmonary:     Effort: Pulmonary effort is normal.     Breath sounds: Normal breath sounds.  Abdominal:     General: Bowel sounds are normal.     Palpations: Abdomen is soft.  Musculoskeletal:     Cervical back: Normal range of motion.  Skin:    General: Skin is warm and dry.     Capillary Refill: Capillary refill takes less than 2 seconds.  Neurological:     Mental Status: She is alert and oriented to person, place, and time.  Psychiatric:        Behavior: Behavior  normal.     Musculoskeletal Exam: C-spine, thoracic spine, lumbar spine have good range of motion with no discomfort.  Shoulder joints, elbow joints, wrist joints, MCPs, PIPs, DIPs have good range of motion with no synovitis.  PIP and DIP thickening consistent osteoarthritis of both hands.  Complete fist formation bilaterally.  Hip joints have good range of motion with no discomfort.  Right knee joint has slightly limited extension with a small effusion.  Left knee joint has good range of motion with no warmth or effusion.  Ankle joints have good range of motion with no joint tenderness.  Pedal edema noted bilaterally.  No tenderness over MTP joints.  CDAI Exam: CDAI Score: --  Patient Global: --; Provider Global: -- Swollen: --; Tender: -- Joint Exam 05/21/2021   No joint exam has been documented for this visit   There is currently no information documented on the homunculus. Go to the Rheumatology activity and complete the homunculus joint exam.  Investigation: No additional findings.  Imaging: No results found.  Recent Labs: Lab Results  Component Value Date   WBC 13.7 (H) 01/09/2021   HGB 14.0 01/09/2021   PLT 246 01/09/2021   NA 140 01/09/2021   K 4.1 01/09/2021   CL 115 (H) 01/09/2021   CO2 20 (L) 01/09/2021   GLUCOSE 99 01/09/2021   BUN 14 01/09/2021   CREATININE 0.61 01/09/2021   BILITOT 1.2 01/09/2021   ALKPHOS 64 01/09/2021   AST 33 01/09/2021   ALT 25 01/09/2021   PROT 6.3 (L) 01/09/2021   ALBUMIN 3.6 01/09/2021   CALCIUM 7.5 (L) 01/09/2021   GFRAA 88 09/29/2020    Speciality Comments: PLQ eye exam: 08/27/2019 normal. [facility]. Follow up in 6 months. PLQ started 07/2019  Procedures:  No procedures performed Allergies: Morphine and related, Other, and Sulfa antibiotics   Assessment / Plan:     Visit Diagnoses: Sjogren's syndrome with other organ involvement ([place]) -  ANA 1: 80 speckled, positive Ro antibody, history of dry eyes and dry skin: She has  ongoing sicca symptoms on a daily basis.  Her eye dryness has been severe but she has not been using any eyedrops for symptomatic relief.  In the past she had an inadequate response to his eyedrop and Restasis as well as over-the-counter products.  She continues to see her ophthalmologist on a yearly basis.  Overall her mouth dryness has been tolerable.  She has not been using any over-the-counter products.  She continues to see the dentist every 6 months and has not had any recent dental caries.  We discussed the importance of proper oral health.  She has not been experiencing any shortness of breath or pleuritic chest pain.  Her lungs were clear to auscultation on examination today.  She has not had any recent rashes.  She has ongoing stiffness in multiple joints especially after sitting for prolonged periods of time.  She experiences intermittent pain and swelling in the right knee joint.  She has been following up with Dr. Lorin Mercy status post arthroscopic surgery on the right knee.  She has had aspirations and cortisone injections in the past which provided temporary relief.  She has no other effusions or synovitis on examination today.  In the past she tried taking Plaquenil for about 3 months but discontinued due to not noticing any clinical benefit.  Discussed restarting on Plaquenil but she is apprehensive due to potential side effects.  She was advised to notify us if she develops any new or worsening symptoms.  We will obtain the following lab work today.  She was also given a handout of information about the diagnosis of Sjogren's to review.  She will follow-up in the office in 5 months.- Plan: CBC with Differential/Platelet, COMPLETE METABOLIC PANEL WITH GFR, Urinalysis, Routine w reflex microscopic, Anti-DNA antibody, double-stranded, C3 and C4, ANA, Sedimentation rate, Serum protein electrophoresis with reflex  High risk medication use - D/c Plaquenil 200 mg p.o. twice daily for about 3 months and then  discontinued due to not noticed any clinical benefit.  CBC, CMP, and UA obtained on 01/09/2021.  Antineutrophil cytoplasmic antibody (ANCA) positive - Patient has no clinical features of vasculitis.  Sed rate has  been normal.  Most recent ESR was 6 on 09/29/2020.  ESR will be checked today.  Primary osteoarthritis of both hands: She has PIP and DIP thickening consistent with osteoarthritis of both hands.  No tenderness or inflammation was noted.  She was able to make a complete fist bilaterally.  Effusion, right knee -She continues to have recurrent right knee joint effusions.  She has been following up with Dr. Lorin Mercy as needed.  In the past she has had an aspiration and cortisone injection which provided temporary relief.  She also tried a trial of Plaquenil for 3 months but discontinued due to not noticing any clinical benefit.  Plan: Sedimentation rate  Primary osteoarthritis of both knees: She experiences stiffness in both knee joints especially after sitting for prolonged periods of time.  On examination today she has limited extension and effusion in the right knee.  She has had an aspiration and cortisone injection in the past performed by Dr. Lorin Mercy.  She also had arthroscopic surgery in the right knee previously.  Discussed the possibility of Visco gel injections in the future so she plans to further discuss with Dr. Lorin Mercy at her next follow up visit.   Other medical conditions are listed as follows:  History of sleep apnea  History of thyroid disease  Other fatigue - SPEP ordered today. Plan: Serum protein electrophoresis with reflex  History of hypokalemia  Screening cholesterol level -Patient requested to have lipid panel updated today.  Plan: Lipid panel  Orders: Orders Placed This Encounter  Procedures   CBC with Differential/Platelet   COMPLETE METABOLIC PANEL WITH GFR   Urinalysis, Routine w reflex microscopic   Anti-DNA antibody, double-stranded   C3 and C4   ANA   Lipid  panel   Sedimentation rate   Serum protein electrophoresis with reflex    No orders of the defined types were placed in this encounter.    Follow-Up Instructions: Return in about 5 months (around 10/21/2021) for Sjogren's syndrome, Osteoarthritis.   Ofilia Neas, PA-C  Note - This record has been created using Dragon software.  Chart creation errors have been sought, but may not always  have been located. Such creation errors do not reflect on  the standard of medical care.

## 2021-05-21 ENCOUNTER — Encounter: Payer: Self-pay | Admitting: Physician Assistant

## 2021-05-21 ENCOUNTER — Ambulatory Visit (INDEPENDENT_AMBULATORY_CARE_PROVIDER_SITE_OTHER): Payer: Managed Care, Other (non HMO) | Admitting: Physician Assistant

## 2021-05-21 ENCOUNTER — Ambulatory Visit: Payer: Managed Care, Other (non HMO) | Admitting: Physician Assistant

## 2021-05-21 ENCOUNTER — Other Ambulatory Visit: Payer: Self-pay

## 2021-05-21 VITALS — BP 145/88 | HR 67 | Ht 66.0 in | Wt 196.6 lb

## 2021-05-21 DIAGNOSIS — M3509 Sicca syndrome with other organ involvement: Secondary | ICD-10-CM

## 2021-05-21 DIAGNOSIS — M19041 Primary osteoarthritis, right hand: Secondary | ICD-10-CM

## 2021-05-21 DIAGNOSIS — Z79899 Other long term (current) drug therapy: Secondary | ICD-10-CM | POA: Diagnosis not present

## 2021-05-21 DIAGNOSIS — R5383 Other fatigue: Secondary | ICD-10-CM

## 2021-05-21 DIAGNOSIS — M25461 Effusion, right knee: Secondary | ICD-10-CM

## 2021-05-21 DIAGNOSIS — Z8669 Personal history of other diseases of the nervous system and sense organs: Secondary | ICD-10-CM

## 2021-05-21 DIAGNOSIS — Z8639 Personal history of other endocrine, nutritional and metabolic disease: Secondary | ICD-10-CM

## 2021-05-21 DIAGNOSIS — Z13220 Encounter for screening for lipoid disorders: Secondary | ICD-10-CM

## 2021-05-21 DIAGNOSIS — M170 Bilateral primary osteoarthritis of knee: Secondary | ICD-10-CM

## 2021-05-21 DIAGNOSIS — R768 Other specified abnormal immunological findings in serum: Secondary | ICD-10-CM

## 2021-05-21 DIAGNOSIS — M19042 Primary osteoarthritis, left hand: Secondary | ICD-10-CM

## 2021-05-24 DIAGNOSIS — H6993 Unspecified Eustachian tube disorder, bilateral: Secondary | ICD-10-CM | POA: Insufficient documentation

## 2021-05-24 NOTE — Progress Notes (Signed)
CBC WNL.  Total bilirubin is borderline elevated. Rest of CMP WNL. Total cholesterol is elevated and LDL is elevated-141. Please forward results to PCP.  ESR and complements WNL.  UA normal. dsDNA is negative.

## 2021-05-25 LAB — URINALYSIS, ROUTINE W REFLEX MICROSCOPIC
Bilirubin Urine: NEGATIVE
Glucose, UA: NEGATIVE
Hgb urine dipstick: NEGATIVE
Ketones, ur: NEGATIVE
Leukocytes,Ua: NEGATIVE
Nitrite: NEGATIVE
Protein, ur: NEGATIVE
Specific Gravity, Urine: 1.008 (ref 1.001–1.035)
pH: 7.5 (ref 5.0–8.0)

## 2021-05-25 LAB — CBC WITH DIFFERENTIAL/PLATELET
Absolute Monocytes: 380 cells/uL (ref 200–950)
Basophils Absolute: 39 cells/uL (ref 0–200)
Basophils Relative: 0.7 %
Eosinophils Absolute: 171 cells/uL (ref 15–500)
Eosinophils Relative: 3.1 %
HCT: 42.4 % (ref 35.0–45.0)
Hemoglobin: 14.4 g/dL (ref 11.7–15.5)
Lymphs Abs: 1722 cells/uL (ref 850–3900)
MCH: 28.2 pg (ref 27.0–33.0)
MCHC: 34 g/dL (ref 32.0–36.0)
MCV: 83 fL (ref 80.0–100.0)
MPV: 10 fL (ref 7.5–12.5)
Monocytes Relative: 6.9 %
Neutro Abs: 3190 cells/uL (ref 1500–7800)
Neutrophils Relative %: 58 %
Platelets: 241 10*3/uL (ref 140–400)
RBC: 5.11 10*6/uL — ABNORMAL HIGH (ref 3.80–5.10)
RDW: 13.7 % (ref 11.0–15.0)
Total Lymphocyte: 31.3 %
WBC: 5.5 10*3/uL (ref 3.8–10.8)

## 2021-05-25 LAB — COMPLETE METABOLIC PANEL WITH GFR
AG Ratio: 1.9 (calc) (ref 1.0–2.5)
ALT: 20 U/L (ref 6–29)
AST: 21 U/L (ref 10–35)
Albumin: 4.9 g/dL (ref 3.6–5.1)
Alkaline phosphatase (APISO): 85 U/L (ref 37–153)
BUN: 10 mg/dL (ref 7–25)
CO2: 29 mmol/L (ref 20–32)
Calcium: 10 mg/dL (ref 8.6–10.4)
Chloride: 106 mmol/L (ref 98–110)
Creat: 0.76 mg/dL (ref 0.50–1.05)
Globulin: 2.6 g/dL (calc) (ref 1.9–3.7)
Glucose, Bld: 95 mg/dL (ref 65–99)
Potassium: 5 mmol/L (ref 3.5–5.3)
Sodium: 142 mmol/L (ref 135–146)
Total Bilirubin: 1.4 mg/dL — ABNORMAL HIGH (ref 0.2–1.2)
Total Protein: 7.5 g/dL (ref 6.1–8.1)
eGFR: 88 mL/min/{1.73_m2} (ref >=60)

## 2021-05-25 LAB — ANTI-DNA ANTIBODY, DOUBLE-STRANDED: ds DNA Ab: <1 IU/mL

## 2021-05-25 LAB — LIPID PANEL
Cholesterol: 229 mg/dL — ABNORMAL HIGH (ref <200)
HDL: 72 mg/dL (ref >=50)
LDL Cholesterol (Calc): 141 mg/dL (calc) — ABNORMAL HIGH
Non-HDL Cholesterol (Calc): 157 mg/dL (calc) — ABNORMAL HIGH (ref <130)
Total CHOL/HDL Ratio: 3.2 (calc) (ref <5.0)
Triglycerides: 64 mg/dL (ref <150)

## 2021-05-25 LAB — PROTEIN ELECTROPHORESIS, SERUM, WITH REFLEX
Albumin ELP: 4.6 g/dL (ref 3.8–4.8)
Alpha 1: 0.4 g/dL — ABNORMAL HIGH (ref 0.2–0.3)
Alpha 2: 0.6 g/dL (ref 0.5–0.9)
Beta 2: 0.4 g/dL (ref 0.2–0.5)
Beta Globulin: 0.5 g/dL (ref 0.4–0.6)
Gamma Globulin: 1 g/dL (ref 0.8–1.7)
Total Protein: 7.4 g/dL (ref 6.1–8.1)

## 2021-05-25 LAB — ANA: Anti Nuclear Antibody (ANA): POSITIVE — AB

## 2021-05-25 LAB — C3 AND C4
C3 Complement: 116 mg/dL (ref 83–193)
C4 Complement: 40 mg/dL (ref 15–57)

## 2021-05-25 LAB — SEDIMENTATION RATE: Sed Rate: 2 mm/h (ref 0–30)

## 2021-05-25 LAB — ANTI-NUCLEAR AB-TITER (ANA TITER): ANA Titer 1: 1:80 {titer} — ABNORMAL HIGH

## 2021-05-25 NOTE — Progress Notes (Signed)
ANA positive but low, nonspecific titer.

## 2021-05-25 NOTE — Progress Notes (Signed)
SPEP did not reveal any abnormal proteins.

## 2021-06-24 ENCOUNTER — Ambulatory Visit: Payer: Managed Care, Other (non HMO) | Admitting: Surgery

## 2021-07-23 ENCOUNTER — Ambulatory Visit: Payer: Managed Care, Other (non HMO) | Admitting: Orthopaedic Surgery

## 2021-11-12 NOTE — Progress Notes (Unsigned)
Office Visit Note  Patient: [name] [name]             Date of Birth: 05/26/1956           MRN: [id number]             PCP: Pcp, No Referring: No ref. provider found Visit Date: 11/26/2021 Occupation: @GUAROCC @  Subjective:  No chief complaint on file.   History of Present Illness: [name] [name] is a 66 y.o. female ***   Activities of Daily Living:  Patient reports morning stiffness for *** {minute/hour:19697}.   Patient {ACTIONS;DENIES/REPORTS:21021675::"Denies"} nocturnal pain.  Difficulty dressing/grooming: {ACTIONS;DENIES/REPORTS:21021675::"Denies"} Difficulty climbing stairs: {ACTIONS;DENIES/REPORTS:21021675::"Denies"} Difficulty getting out of chair: {ACTIONS;DENIES/REPORTS:21021675::"Denies"} Difficulty using hands for taps, buttons, cutlery, and/or writing: {ACTIONS;DENIES/REPORTS:21021675::"Denies"}  No Rheumatology ROS completed.   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   Snoring 03/25/2015   Hypersomnia with sleep apnea 03/25/2015   Multinodular goiter (nontoxic) 02/08/2012   Acute pancreatitis 10/26/2011   Nausea and vomiting in adult 10/26/2011   Dehydration 10/26/2011   Gastroenteritis 10/26/2011   Hypokalemia 10/26/2011   Elevated liver function tests 10/26/2011   Acid reflux    Thyroid disease     Past Medical History:  Diagnosis Date   Abnormal liver enzymes    Acid reflux    Arthritis    back and neck   Bloating    Bronchitis    Chest pain, unspecified    Diarrhea    Dyspepsia    Esophageal reflux    H/O acute pancreatitis    H/O hiatal hernia    Headache(784.0)    migraines   Hemorrhage of rectum and anus    Hypercholesteremia    Nausea    Sjogren's disease (HCC)    Spinal stenosis, cervical region     Thyroid disease    Thyroid nodule     Family History  Problem Relation Age of Onset   Colon cancer Mother    Pancreatic cancer Brother    Healthy Son    Healthy Daughter    Past Surgical History:  Procedure Laterality Date   ABDOMINAL HYSTERECTOMY  2007   total with BSO   CHOLECYSTECTOMY     COLONOSCOPY     ECTOPIC PREGNANCY SURGERY     ESOPHAGOGASTRODUODENOSCOPY     EUS  12/21/2011   Procedure: ESOPHAGEAL ENDOSCOPIC ULTRASOUND (EUS) RADIAL;  Surgeon: 12/23/2011, MD;  Location: [facility];  Service: Endoscopy;  Laterality: N/A;   eye cautery     EYE SURGERY     lasik   EYE SURGERY Left    cataract extraction    KNEE ARTHROSCOPY Right 06/11/2020   torn meniscus    Social History   Social History Narrative   Married.   Drinks occasional sweet tea.   Works at 08/11/2020 as a [employer].   Immunization History  Administered Date(s) Administered   PFIZER(Purple Top)SARS-COV-2 Vaccination 11/22/2019, 12/13/2019, 07/01/2020, 03/26/2021     Objective: Vital Signs: There were no vitals taken for this visit.   Physical Exam   Musculoskeletal Exam: ***  CDAI Exam: CDAI Score: -- Patient Global: --; Provider Global: -- Swollen: --; Tender: -- Joint Exam 11/26/2021   No joint exam has been documented for this visit   There is currently  no information documented on the homunculus. Go to the Rheumatology activity and complete the homunculus joint exam.  Investigation: No additional findings.  Imaging: No results found.  Recent Labs: Lab Results  Component Value Date   WBC 5.5 05/21/2021   HGB 14.4 05/21/2021   PLT 241 05/21/2021   NA 142 05/21/2021   K 5.0 05/21/2021   CL 106 05/21/2021   CO2 29 05/21/2021   GLUCOSE 95 05/21/2021   BUN 10 05/21/2021   CREATININE 0.76 05/21/2021   BILITOT 1.4 (H) 05/21/2021   ALKPHOS 64 01/09/2021   AST 21 05/21/2021   ALT 20 05/21/2021   PROT 7.5 05/21/2021   PROT 7.4 05/21/2021   ALBUMIN  3.6 01/09/2021   CALCIUM 10.0 05/21/2021   GFRAA 88 09/29/2020    Speciality Comments: PLQ eye exam: 08/27/2019 normal. [facility]. Follow up in 6 months. PLQ started 07/2019  Procedures:  No procedures performed Allergies: Morphine and related, Other, and Sulfa antibiotics   Assessment / Plan:     Visit Diagnoses: Sjogren's syndrome with other organ involvement (HCC)  High risk medication use  Antineutrophil cytoplasmic antibody (ANCA) positive  Primary osteoarthritis of both hands  Effusion, right knee  Primary osteoarthritis of both knees  History of sleep apnea  History of thyroid disease  Other fatigue  History of hypokalemia  Orders: No orders of the defined types were placed in this encounter.  No orders of the defined types were placed in this encounter.   Face-to-face time spent with patient was *** minutes. Greater than 50% of time was spent in counseling and coordination of care.  Follow-Up Instructions: No follow-ups on file.   Gearldine Bienenstock, PA-C  Note - This record has been created using Dragon software.  Chart creation errors have been sought, but may not always  have been located. Such creation errors do not reflect on  the standard of medical care.,

## 2021-11-19 ENCOUNTER — Ambulatory Visit: Payer: Managed Care, Other (non HMO) | Admitting: Rheumatology

## 2021-11-24 ENCOUNTER — Other Ambulatory Visit: Payer: Self-pay | Admitting: Obstetrics and Gynecology

## 2021-11-24 DIAGNOSIS — Z1231 Encounter for screening mammogram for malignant neoplasm of breast: Secondary | ICD-10-CM

## 2021-11-26 ENCOUNTER — Encounter: Payer: Self-pay | Admitting: Rheumatology

## 2021-11-26 ENCOUNTER — Other Ambulatory Visit: Payer: Self-pay

## 2021-11-26 ENCOUNTER — Ambulatory Visit (INDEPENDENT_AMBULATORY_CARE_PROVIDER_SITE_OTHER): Payer: Managed Care, Other (non HMO) | Admitting: Rheumatology

## 2021-11-26 VITALS — BP 143/79 | HR 74 | Ht 66.0 in | Wt 193.0 lb

## 2021-11-26 DIAGNOSIS — Z79899 Other long term (current) drug therapy: Secondary | ICD-10-CM

## 2021-11-26 DIAGNOSIS — M19041 Primary osteoarthritis, right hand: Secondary | ICD-10-CM | POA: Diagnosis not present

## 2021-11-26 DIAGNOSIS — Z13820 Encounter for screening for osteoporosis: Secondary | ICD-10-CM

## 2021-11-26 DIAGNOSIS — R768 Other specified abnormal immunological findings in serum: Secondary | ICD-10-CM | POA: Diagnosis not present

## 2021-11-26 DIAGNOSIS — Z8639 Personal history of other endocrine, nutritional and metabolic disease: Secondary | ICD-10-CM

## 2021-11-26 DIAGNOSIS — M3509 Sicca syndrome with other organ involvement: Secondary | ICD-10-CM

## 2021-11-26 DIAGNOSIS — M19042 Primary osteoarthritis, left hand: Secondary | ICD-10-CM

## 2021-11-26 DIAGNOSIS — R5383 Other fatigue: Secondary | ICD-10-CM

## 2021-11-26 DIAGNOSIS — Z8669 Personal history of other diseases of the nervous system and sense organs: Secondary | ICD-10-CM

## 2021-11-26 DIAGNOSIS — M25461 Effusion, right knee: Secondary | ICD-10-CM

## 2021-11-26 DIAGNOSIS — M170 Bilateral primary osteoarthritis of knee: Secondary | ICD-10-CM

## 2021-11-26 NOTE — Patient Instructions (Signed)
Systane ultra eyedrops ?Genteal eye ointment ?Use a humidifier in the room or a plant ?

## 2021-12-01 LAB — CBC WITH DIFFERENTIAL/PLATELET
Absolute Monocytes: 392 cells/uL (ref 200–950)
Basophils Absolute: 22 cells/uL (ref 0–200)
Basophils Relative: 0.4 %
Eosinophils Absolute: 190 cells/uL (ref 15–500)
Eosinophils Relative: 3.4 %
HCT: 41.6 % (ref 35.0–45.0)
Hemoglobin: 13.6 g/dL (ref 11.7–15.5)
Lymphs Abs: 1613 cells/uL (ref 850–3900)
MCH: 27.9 pg (ref 27.0–33.0)
MCHC: 32.7 g/dL (ref 32.0–36.0)
MCV: 85.2 fL (ref 80.0–100.0)
MPV: 10.6 fL (ref 7.5–12.5)
Monocytes Relative: 7 %
Neutro Abs: 3382 cells/uL (ref 1500–7800)
Neutrophils Relative %: 60.4 %
Platelets: 237 10*3/uL (ref 140–400)
RBC: 4.88 10*6/uL (ref 3.80–5.10)
RDW: 13.5 % (ref 11.0–15.0)
Total Lymphocyte: 28.8 %
WBC: 5.6 10*3/uL (ref 3.8–10.8)

## 2021-12-01 LAB — PROTEIN ELECTROPHORESIS, SERUM, WITH REFLEX
Albumin ELP: 4.5 g/dL (ref 3.8–4.8)
Alpha 1: 0.3 g/dL (ref 0.2–0.3)
Alpha 2: 0.5 g/dL (ref 0.5–0.9)
Beta 2: 0.4 g/dL (ref 0.2–0.5)
Beta Globulin: 0.4 g/dL (ref 0.4–0.6)
Gamma Globulin: 0.9 g/dL (ref 0.8–1.7)
Total Protein: 7.1 g/dL (ref 6.1–8.1)

## 2021-12-01 LAB — COMPLETE METABOLIC PANEL WITH GFR
AG Ratio: 2 (calc) (ref 1.0–2.5)
ALT: 13 U/L (ref 6–29)
AST: 15 U/L (ref 10–35)
Albumin: 4.7 g/dL (ref 3.6–5.1)
Alkaline phosphatase (APISO): 80 U/L (ref 37–153)
BUN: 16 mg/dL (ref 7–25)
CO2: 28 mmol/L (ref 20–32)
Calcium: 10 mg/dL (ref 8.6–10.4)
Chloride: 107 mmol/L (ref 98–110)
Creat: 0.77 mg/dL (ref 0.50–1.05)
Globulin: 2.3 g/dL (calc) (ref 1.9–3.7)
Glucose, Bld: 81 mg/dL (ref 65–99)
Potassium: 4.8 mmol/L (ref 3.5–5.3)
Sodium: 142 mmol/L (ref 135–146)
Total Bilirubin: 0.9 mg/dL (ref 0.2–1.2)
Total Protein: 7 g/dL (ref 6.1–8.1)
eGFR: 86 mL/min/{1.73_m2} (ref >=60)

## 2021-12-01 LAB — SJOGRENS SYNDROME-A EXTRACTABLE NUCLEAR ANTIBODY: SSA (Ro) (ENA) Antibody, IgG: >8 AI — AB

## 2021-12-01 LAB — URINALYSIS, ROUTINE W REFLEX MICROSCOPIC
Bilirubin Urine: NEGATIVE
Glucose, UA: NEGATIVE
Hgb urine dipstick: NEGATIVE
Ketones, ur: NEGATIVE
Leukocytes,Ua: NEGATIVE
Nitrite: NEGATIVE
Protein, ur: NEGATIVE
Specific Gravity, Urine: 1.015 (ref 1.001–1.035)
pH: 5.5 (ref 5.0–8.0)

## 2021-12-01 LAB — ANTI-DNA ANTIBODY, DOUBLE-STRANDED: ds DNA Ab: <1 IU/mL

## 2021-12-01 LAB — C3 AND C4
C3 Complement: 113 mg/dL (ref 83–193)
C4 Complement: 38 mg/dL (ref 15–57)

## 2021-12-01 LAB — SEDIMENTATION RATE: Sed Rate: 6 mm/h (ref 0–30)

## 2021-12-08 ENCOUNTER — Ambulatory Visit
Admission: RE | Admit: 2021-12-08 | Discharge: 2021-12-08 | Disposition: A | Discharge status: Home / Self Care | Payer: Managed Care, Other (non HMO) | Source: Ambulatory Visit | Attending: Obstetrics and Gynecology | Admitting: Obstetrics and Gynecology

## 2021-12-08 DIAGNOSIS — Z1231 Encounter for screening mammogram for malignant neoplasm of breast: Secondary | ICD-10-CM

## 2021-12-08 LAB — HM MAMMOGRAPHY: HM Mammogram: NORMAL (ref 0–4)

## 2022-02-15 ENCOUNTER — Other Ambulatory Visit (INDEPENDENT_AMBULATORY_CARE_PROVIDER_SITE_OTHER): Payer: 59

## 2022-02-15 ENCOUNTER — Encounter: Payer: Self-pay | Admitting: Internal Medicine

## 2022-02-15 ENCOUNTER — Ambulatory Visit: Payer: 59 | Admitting: Internal Medicine

## 2022-02-15 VITALS — BP 112/70 | HR 76 | Temp 98.0°F | Ht 66.0 in | Wt 193.4 lb

## 2022-02-15 DIAGNOSIS — Z23 Encounter for immunization: Secondary | ICD-10-CM

## 2022-02-15 DIAGNOSIS — Z9989 Dependence on other enabling machines and devices: Secondary | ICD-10-CM

## 2022-02-15 DIAGNOSIS — Z6831 Body mass index (BMI) 31.0-31.9, adult: Secondary | ICD-10-CM

## 2022-02-15 DIAGNOSIS — E041 Nontoxic single thyroid nodule: Secondary | ICD-10-CM

## 2022-02-15 DIAGNOSIS — G4733 Obstructive sleep apnea (adult) (pediatric): Secondary | ICD-10-CM

## 2022-02-15 DIAGNOSIS — E559 Vitamin D deficiency, unspecified: Secondary | ICD-10-CM

## 2022-02-15 DIAGNOSIS — M170 Bilateral primary osteoarthritis of knee: Secondary | ICD-10-CM

## 2022-02-15 DIAGNOSIS — M546 Pain in thoracic spine: Secondary | ICD-10-CM

## 2022-02-15 DIAGNOSIS — Z79899 Other long term (current) drug therapy: Secondary | ICD-10-CM

## 2022-02-15 DIAGNOSIS — R1030 Lower abdominal pain, unspecified: Secondary | ICD-10-CM | POA: Diagnosis not present

## 2022-02-15 DIAGNOSIS — Z7689 Persons encountering health services in other specified circumstances: Secondary | ICD-10-CM

## 2022-02-15 DIAGNOSIS — G8929 Other chronic pain: Secondary | ICD-10-CM

## 2022-02-15 DIAGNOSIS — R413 Other amnesia: Secondary | ICD-10-CM

## 2022-02-15 LAB — POCT URINALYSIS DIPSTICK
Bilirubin, UA: NEGATIVE
Glucose, UA: NEGATIVE
Ketones, UA: NEGATIVE
Leukocytes, UA: NEGATIVE
Nitrite, UA: NEGATIVE
Protein, UA: NEGATIVE
Spec Grav, UA: 1.01 (ref 1.010–1.025)
Urobilinogen, UA: 0.2 E.U./dL
pH, UA: 5.5 (ref 5.0–8.0)

## 2022-02-15 NOTE — Patient Instructions (Addendum)
The 10-year ASCVD risk score (Arnett DK, et al., 2019) is: 5.5%   Values used to calculate the score:     Age: 65 years     Sex: Female     Is Non-Hispanic African American: Yes     Diabetic: No     Tobacco smoker: No     Systolic Blood Pressure: 112 mmHg     Is BP treated: No     HDL Cholesterol: 72 mg/dL     Total Cholesterol: 229 mg/dL   Abdominal Pain, Adult Pain in the abdomen (abdominal pain) can be caused by many things. Often, abdominal pain is not serious and it gets better with no treatment or by being treated at home. However, sometimes abdominal pain is serious. Your health care provider will ask questions about your medical history and do a physical exam to try to determine the cause of your abdominal pain. Follow these instructions at home: Medicines Take over-the-counter and prescription medicines only as told by your health care provider. Do not take a laxative unless told by your health care provider. General instructions  Watch your condition for any changes. Drink enough fluid to keep your urine pale yellow. Keep all follow-up visits as told by your health care provider. This is important. Contact a health care provider if: Your abdominal pain changes or gets worse. You are not hungry or you lose weight without trying. You are constipated or have diarrhea for more than 2-3 days. You have pain when you urinate or have a bowel movement. Your abdominal pain wakes you up at night. Your pain gets worse with meals, after eating, or with certain foods. You are vomiting and cannot keep anything down. You have a fever. You have blood in your urine. Get help right away if: Your pain does not go away as soon as your health care provider told you to expect. You cannot stop vomiting. Your pain is only in areas of the abdomen, such as the right side or the left lower portion of the abdomen. Pain on the right side could be caused by appendicitis. You have bloody or black  stools, or stools that look like tar. You have severe pain, cramping, or bloating in your abdomen. You have signs of dehydration, such as: Dark urine, very little urine, or no urine. Cracked lips. Dry mouth. Sunken eyes. Sleepiness. Weakness. You have trouble breathing or chest pain. Summary Often, abdominal pain is not serious and it gets better with no treatment or by being treated at home. However, sometimes abdominal pain is serious. Watch your condition for any changes. Take over-the-counter and prescription medicines only as told by your health care provider. Contact a health care provider if your abdominal pain changes or gets worse. Get help right away if you have severe pain, cramping, or bloating in your abdomen. This information is not intended to replace advice given to you by your health care provider. Make sure you discuss any questions you have with your health care provider. Document Revised: 10/11/2019 Document Reviewed: 12/31/2018 Elsevier Patient Education  2023 [employer].

## 2022-02-15 NOTE — Progress Notes (Signed)
I,Victoria T Hamilton,acting as a scribe for Gwynneth Aliment, MD.,have documented all relevant documentation on the behalf of Gwynneth Aliment, MD,as directed by  Gwynneth Aliment, MD while in the presence of Gwynneth Aliment, MD.  This visit occurred during the SARS-CoV-2 public health emergency.  Safety protocols were in place, including screening questions prior to the visit, additional usage of staff PPE, and extensive cleaning of exam room while observing appropriate contact time as indicated for disinfecting solutions.  Subjective:     Patient ID: [name] [name] , female    DOB: [birth year] , 66 y.o.   MRN: [id number]   Chief Complaint  Patient presents with   Establish Care    HPI  Pt presents today to est care. She was referred by her neighbor, Beverly Sessions who is also a [facility] pt. Her chronic medical illnesses include Sjogrens syndrome, OSA on CPAP, thyroid nodule and GERD. She is followed by Rheum, Dr. Corliss Skains for Sjogrens syndrome. Her GYN is Dr. Richarda Overlie, last visit in March 2023. She is retired as a Teacher, English as a foreign language.   She reports having lower abdominal pain recently. She states the pain has been ongoing for 5 months. She is established with [facility] and has already had a workup with them. She was previously followed by Dr. Matthias Hughs, but he has since retired.  She is now followed by a PA. She did not order any additional testing. She was advised that her sx are likely due to scar tissue. She is concerned because her sx have been going on since early May. She is not sure what is triggering her sx. She is UTD w/ colonoscopy, last done August 2022.        Past Medical History:  Diagnosis Date   Abnormal liver enzymes    Acid reflux    Arthritis    back and neck   Bloating    Bronchitis    Chest pain, unspecified    Diarrhea    Dyspepsia    Esophageal reflux    H/O acute pancreatitis    H/O hiatal hernia    Headache(784.0)    migraines   Hemorrhage of rectum and  anus    Hypercholesteremia    Nausea    Sjogren's disease (HCC)    Spinal stenosis, cervical region    Thyroid disease    Thyroid nodule      Family History  Problem Relation Age of Onset   Colon cancer Mother    Pancreatic cancer Brother    Healthy Son    Healthy Daughter      Current Outpatient Medications:    omeprazole-sodium bicarbonate (ZEGERID) 40-1100 MG capsule, Take 1 capsule by mouth as needed., Disp: , Rfl:    VITAMIN D PO, Take 2,000 Units by mouth daily., Disp: , Rfl:    Ascorbic Acid (VITAMIN C) 1000 MG tablet, Take 1,000 mg by mouth daily. (Patient not taking: Reported on 11/26/2021), Disp: , Rfl:    Cyanocobalamin (VITAMIN B 12 PO), Take by mouth. (Patient not taking: Reported on 05/21/2021), Disp: , Rfl:    Multiple Vitamin (MULTIVITAMIN) capsule, Take 1 capsule by mouth daily. (Patient not taking: Reported on 11/26/2021), Disp: , Rfl:    Allergies  Allergen Reactions   Morphine And Related Shortness Of Breath   Other     TRILTYE- stomach upset   Sulfa Antibiotics Hives     Review of Systems  Constitutional: Negative.   HENT: Negative.    Eyes: Negative.  Respiratory: Negative.    Cardiovascular: Negative.   Gastrointestinal:  Positive for abdominal pain.       She c/o chronic abdominal pain. She is also followed by [facility]. Last colonoscopy was in August 2022. She states this pain started in May 2022. It is RLQ, LLQ pain - described as dull, throbbing. Sometimes relieved with bowel movements. Not triggered by food intake.   Endocrine: Negative.   Genitourinary: Negative.   Musculoskeletal:  Positive for arthralgias and back pain.       She c/o bl knee pain.   Skin: Negative.   Allergic/Immunologic: Negative.   Neurological: Negative.   Hematological: Negative.   Psychiatric/Behavioral: Negative.       Today's Vitals   02/15/22 1528  BP: 112/70  Pulse: 76  Temp: 98 F (36.7 C)  SpO2: 98%  Weight: 193 lb 6.4 oz (87.7 kg)  Height: 5\' 6"   (1.676 m)  PainSc: 0-No pain   Body mass index is 31.22 kg/m.  Wt Readings from Last 3 Encounters:  02/15/22 193 lb 6.4 oz (87.7 kg)  11/26/21 193 lb (87.5 kg)  05/21/21 196 lb 9.6 oz (89.2 kg)    Objective:  Physical Exam Vitals and nursing note reviewed.  Constitutional:      Appearance: Normal appearance.  HENT:     Head: Normocephalic and atraumatic.  Eyes:     Extraocular Movements: Extraocular movements intact.  Neck:     Thyroid: Thyromegaly present.  Cardiovascular:     Rate and Rhythm: Normal rate and regular rhythm.     Heart sounds: Normal heart sounds.  Pulmonary:     Effort: Pulmonary effort is normal.     Breath sounds: Normal breath sounds.  Abdominal:     General: Bowel sounds are normal.     Palpations: Abdomen is soft.  Musculoskeletal:     Cervical back: Normal range of motion.  Skin:    General: Skin is warm.  Neurological:     General: No focal deficit present.     Mental Status: She is alert.  Psychiatric:        Mood and Affect: Mood normal.        Behavior: Behavior normal.      Assessment And Plan:     1. Lower abdominal pain Comments: I have reviewed labs from March 2023, CBC and CMP are WNL. Possibly due to gas, she will c/w Zegerid for now. May benefit from probiotic. May 2022 CT scan results reviewed in full detail. I will request her records from [facility]. If persistent, will consider additional studies.   2. Primary osteoarthritis of both knees Comments: Chronic, she may benefit from ivtamin D supplementation.   3. Obstructive sleep apnea on CPAP Comments: Chronic, encouraged to comply with CPAP use, aiming for at least 4 hours per night.   4. Memory change Comments: I will check labs as below. I will make further recommendations once her labs are available for review.   5. Vitamin D deficiency disease Comments: She reports h/o low vitamin D, I will check vitamin D level and supplement as needed.  - Vitamin D (25 hydroxy)  6.  Thyroid nodule Comments: I will check TSH today. Thyroid u/s from 2018 was reviewed in full detail.  - TSH  7. Chronic midline thoracic back pain Chronic, may benefit from chiropractic and laser therapy. Chronic constipation can also cause both abdominal and low back discomfort. Denies LE weakness/paresthesias. She should perform stretching exercises daily.   8.  Body mass index (BMI) of 31.0 to 31.9 in adult Comments:  She is encouraged to initially strive for BMI less than 28 to decrease cardiac risk. Advised to aim for at least 150 minutes of exercise per week  9. Drug therapy - Vitamin B12  10. Encounter to establish care  11. Immunization due - Varicella-zoster vaccine IM (Shingrix)  Patient was given opportunity to ask questions. Patient verbalized understanding of the plan and was able to repeat key elements of the plan. All questions were answered to their satisfaction.   I, Maximino Greenland, MD, have reviewed all documentation for this visit. The documentation on 02/15/22 for the exam, diagnosis, procedures, and orders are all accurate and complete.   IF YOU HAVE BEEN REFERRED TO A SPECIALIST, IT MAY TAKE 1-2 WEEKS TO SCHEDULE/PROCESS THE REFERRAL. IF YOU HAVE NOT HEARD FROM US/SPECIALIST IN TWO WEEKS, PLEASE GIVE [place] A CALL AT [phone].   THE PATIENT IS ENCOURAGED TO PRACTICE SOCIAL DISTANCING DUE TO THE COVID-19 PANDEMIC.

## 2022-02-16 LAB — VITAMIN D 25 HYDROXY (VIT D DEFICIENCY, FRACTURES): Vit D, 25-Hydroxy: 27.5 ng/mL — ABNORMAL LOW (ref 30.0–100.0)

## 2022-02-16 LAB — VITAMIN B12: Vitamin B-12: 381 pg/mL (ref 232–1245)

## 2022-02-16 LAB — TSH: TSH: 1.2 u[IU]/mL (ref 0.450–4.500)

## 2022-04-13 ENCOUNTER — Ambulatory Visit: Payer: 59 | Admitting: Internal Medicine

## 2022-04-13 ENCOUNTER — Encounter: Payer: Self-pay | Admitting: Internal Medicine

## 2022-04-13 VITALS — BP 116/80 | HR 70 | Temp 98.5°F | Ht 66.0 in | Wt 195.0 lb

## 2022-04-13 DIAGNOSIS — G3281 Cerebellar ataxia in diseases classified elsewhere: Secondary | ICD-10-CM | POA: Insufficient documentation

## 2022-04-13 DIAGNOSIS — E6609 Other obesity due to excess calories: Secondary | ICD-10-CM

## 2022-04-13 DIAGNOSIS — Z6831 Body mass index (BMI) 31.0-31.9, adult: Secondary | ICD-10-CM

## 2022-04-13 DIAGNOSIS — R0982 Postnasal drip: Secondary | ICD-10-CM | POA: Diagnosis not present

## 2022-04-13 DIAGNOSIS — M7989 Other specified soft tissue disorders: Secondary | ICD-10-CM

## 2022-04-13 DIAGNOSIS — M25512 Pain in left shoulder: Secondary | ICD-10-CM

## 2022-04-13 NOTE — Progress Notes (Signed)
Jeri Cos Llittleton,acting as a Neurosurgeon for Gwynneth Aliment, MD.,have documented all relevant documentation on the behalf of Gwynneth Aliment, MD,as directed by  Gwynneth Aliment, MD while in the presence of Gwynneth Aliment, MD.    Subjective:     Patient ID: [name] [name] , female    DOB: [birth year] , 66 y.o.   MRN: [id number]   Chief Complaint  Patient presents with   Shoulder Pain    HPI  Patient presents today for left shoulder pain that started over a week ago. She states she awakened with left shoulder pain one morning, she denies falls/trauma. She does admit to sleeping on her left side. She has tried ice, heat packs and ibuprofen without relief of her sx. There is some pain with movement. Pain is worse upon awakening.    She also reports having a knot on the left side of her neck. She reports having it for a few years now. She has discussed this in detail with her previous provider; however, has never had a definitive diagnosis. She reports she goes to the dentist regularly, but hasn't discussed this "knot" with them.  Shoulder Pain  The pain is present in the left shoulder. This is a new problem. The current episode started 1 to 4 weeks ago. The problem occurs constantly. The problem has been unchanged. The quality of the pain is described as aching. The pain is at a severity of 3/10. The pain is moderate. The symptoms are aggravated by activity. She has tried heat and cold for the symptoms. The treatment provided mild relief. There is no history of diabetes.     Past Medical History:  Diagnosis Date   Abnormal liver enzymes    Acid reflux    Arthritis    back and neck   Bloating    Bronchitis    Chest pain, unspecified    Diarrhea    Dyspepsia    Esophageal reflux    H/O acute pancreatitis    H/O hiatal hernia    Headache(784.0)    migraines   Hemorrhage of rectum and anus    Hypercholesteremia    Nausea    Sjogren's disease (HCC)    Spinal stenosis, cervical  region    Thyroid disease    Thyroid nodule      Family History  Problem Relation Age of Onset   Colon cancer Mother    Pancreatic cancer Brother    Healthy Son    Healthy Daughter      Current Outpatient Medications:    Cyanocobalamin (VITAMIN B 12 PO), Take by mouth., Disp: , Rfl:    Omega-3 Fatty Acids (FISH OIL PO), Take by mouth 2 (two) times daily., Disp: , Rfl:    omeprazole-sodium bicarbonate (ZEGERID) 40-1100 MG capsule, Take 1 capsule by mouth as needed., Disp: , Rfl:    VITAMIN D PO, Take 2,000 Units by mouth daily., Disp: , Rfl:    Allergies  Allergen Reactions   Morphine And Related Shortness Of Breath   Other     TRILTYE- stomach upset   Sulfa Antibiotics Hives     Review of Systems  Constitutional: Negative.   HENT:  Positive for postnasal drip.   Respiratory: Negative.    Cardiovascular: Negative.   Gastrointestinal: Negative.   Musculoskeletal:  Positive for arthralgias.  Neurological: Negative.   Psychiatric/Behavioral: Negative.       Today's Vitals   04/13/22 0922  BP: 116/80  Pulse: 70  Temp:  98.5 F (36.9 C)  Weight: 195 lb (88.5 kg)  Height: 5\' 6"  (1.676 m)  PainSc: 2   PainLoc: Shoulder   Body mass index is 31.47 kg/m.  Wt Readings from Last 3 Encounters:  04/13/22 195 lb (88.5 kg)  02/15/22 193 lb 6.4 oz (87.7 kg)  11/26/21 193 lb (87.5 kg)     Objective:  Physical Exam Vitals and nursing note reviewed.  Constitutional:      Appearance: Normal appearance.  HENT:     Head: Normocephalic and atraumatic.  Cardiovascular:     Rate and Rhythm: Normal rate and regular rhythm.     Heart sounds: Normal heart sounds.  Pulmonary:     Effort: Pulmonary effort is normal.     Breath sounds: Normal breath sounds.  Musculoskeletal:        General: Tenderness present.     Cervical back: Normal range of motion.  Skin:    General: Skin is warm.  Neurological:     General: No focal deficit present.     Mental Status: She is alert.   Psychiatric:        Mood and Affect: Mood normal.        Behavior: Behavior normal.      Assessment And Plan:     1. Acute pain of left shoulder Comments: She does have underlying Sjogren's, also followed by Rheum. She prefers to wait until upcoming appt with Ortho for x-rays. May benefit from ART therapy.   2. Soft tissue mass Comments: Not appreciated today. Based on location, I think this is salivary gland.  3. Postnasal drip Comments: She is advised to try OTC loratadine or cetirizine daily. She will let me know if her sx persist, may benefit from cutting back on dairy intake as well.  4. Class 1 obesity due to excess calories without serious comorbidity with body mass index (BMI) of 31.0 to 31.9 in adult Comments: She is encouraged to aim for at least 150 minutes of exercise per week, while striving for BMI<29.   Patient was given opportunity to ask questions. Patient verbalized understanding of the plan and was able to repeat key elements of the plan. All questions were answered to their satisfaction.   I, 11/28/21, MD, have reviewed all documentation for this visit. The documentation on 04/23/22 for the exam, diagnosis, procedures, and orders are all accurate and complete.   IF YOU HAVE BEEN REFERRED TO A SPECIALIST, IT MAY TAKE 1-2 WEEKS TO SCHEDULE/PROCESS THE REFERRAL. IF YOU HAVE NOT HEARD FROM US/SPECIALIST IN TWO WEEKS, PLEASE GIVE 04/25/22 A CALL AT [phone].   THE PATIENT IS ENCOURAGED TO PRACTICE SOCIAL DISTANCING DUE TO THE COVID-19 PANDEMIC.

## 2022-04-13 NOTE — Patient Instructions (Signed)
ART therapy  Shoulder Pain Many things can cause shoulder pain, including: An injury to the shoulder. Overuse of the shoulder. Arthritis. The source of the pain can be: Inflammation. An injury to the shoulder joint. An injury to a tendon, ligament, or bone. Follow these instructions at home: Pay attention to changes in your symptoms. Let your health care provider know about them. Follow these instructions to relieve your pain. If you have a sling: Wear the sling as told by your health care provider. Remove it only as told by your health care provider. Loosen the sling if your fingers tingle, become numb, or turn cold and blue. Keep the sling clean. If the sling is not waterproof: Do not let it get wet. Remove it to shower or bathe. Move your arm as little as possible, but keep your hand moving to prevent swelling. Managing pain, stiffness, and swelling  If directed, put ice on the painful area: Put ice in a plastic bag. Place a towel between your skin and the bag. Leave the ice on for 20 minutes, 2-3 times per day. Stop applying ice if it does not help with the pain. Squeeze a soft ball or a foam pad as much as possible. This helps to keep the shoulder from swelling. It also helps to strengthen the arm. General instructions Take over-the-counter and prescription medicines only as told by your health care provider. Keep all follow-up visits as told by your health care provider. This is important. Contact a health care provider if: Your pain gets worse. Your pain is not relieved with medicines. New pain develops in your arm, hand, or fingers. Get help right away if: Your arm, hand, or fingers: Tingle. Become numb. Become swollen. Become painful. Turn white or blue. Summary Shoulder pain can be caused by an injury, overuse, or arthritis. Pay attention to changes in your symptoms. Let your health care provider know about them. This condition may be treated with a sling, ice,  and pain medicines. Contact your health care provider if the pain gets worse or new pain develops. Get help right away if your arm, hand, or fingers tingle or become numb, swollen, or painful. Keep all follow-up visits as told by your health care provider. This is important. This information is not intended to replace advice given to you by your health care provider. Make sure you discuss any questions you have with your health care provider. Document Revised: 05/07/2021 Document Reviewed: 05/07/2021 Elsevier Patient Education  2023 [employer].

## 2022-04-19 ENCOUNTER — Ambulatory Visit: Payer: Managed Care, Other (non HMO)

## 2022-04-23 DIAGNOSIS — E6609 Other obesity due to excess calories: Secondary | ICD-10-CM | POA: Insufficient documentation

## 2022-04-23 DIAGNOSIS — M25512 Pain in left shoulder: Secondary | ICD-10-CM | POA: Insufficient documentation

## 2022-04-23 DIAGNOSIS — R0982 Postnasal drip: Secondary | ICD-10-CM | POA: Insufficient documentation

## 2022-04-23 DIAGNOSIS — M7989 Other specified soft tissue disorders: Secondary | ICD-10-CM | POA: Insufficient documentation

## 2022-04-23 DIAGNOSIS — E66811 Obesity, class 1: Secondary | ICD-10-CM | POA: Insufficient documentation

## 2022-04-25 ENCOUNTER — Encounter: Payer: Self-pay | Admitting: Internal Medicine

## 2022-04-27 ENCOUNTER — Ambulatory Visit: Payer: 59 | Admitting: Orthopaedic Surgery

## 2022-04-27 ENCOUNTER — Ambulatory Visit (INDEPENDENT_AMBULATORY_CARE_PROVIDER_SITE_OTHER): Payer: 59

## 2022-04-27 ENCOUNTER — Encounter: Payer: Self-pay | Admitting: Internal Medicine

## 2022-04-27 ENCOUNTER — Encounter: Payer: Self-pay | Admitting: Orthopaedic Surgery

## 2022-04-27 VITALS — BP 131/82 | HR 78 | Ht 66.5 in | Wt 194.0 lb

## 2022-04-27 DIAGNOSIS — G8929 Other chronic pain: Secondary | ICD-10-CM

## 2022-04-27 DIAGNOSIS — M25561 Pain in right knee: Secondary | ICD-10-CM

## 2022-04-27 DIAGNOSIS — M170 Bilateral primary osteoarthritis of knee: Secondary | ICD-10-CM | POA: Diagnosis not present

## 2022-04-27 NOTE — Progress Notes (Signed)
Office Visit Note   Patient: [name] [name]           Date of Birth: 10/10/1955           MRN: [id number] Visit Date: 04/27/2022              Requested by: Dorothyann Peng, MD [address] [place],  [place] [address] PCP: Dorothyann Peng, MD   Assessment & Plan: Visit Diagnoses:  1. Chronic pain of right knee   2. Primary osteoarthritis of both knees     Plan: We discussed right total knee arthroplasty, indications, technique, spinal anesthesia block, therapy both home and outpatient etc.  Currently she is doing fairly well and can return in 6 months for recheck.  If she starts having significant problems with her knee she will call and let us know.  Follow-Up Instructions: Return in about 6 months (around 10/28/2022).   Orders:  Orders Placed This Encounter  Procedures   XR KNEE 3 VIEW RIGHT   No orders of the defined types were placed in this encounter.     Procedures: No procedures performed   Clinical Data: No additional findings.   Subjective: Chief Complaint  Patient presents with   Right Knee - Pain    HPI 66 year old female returns with problems with progressive right knee valgus.  She had an injection in her shoulder by Dr. Darrelyn Hillock when she could not get into see [place] quickly.  She also took some prednisone her shoulder is better moving better but she is having continued problems with her knee with progressive valgus but states actually the knee is not really hurting much.  Some improvement may be related to the recent prednisone.  Additionally patient's had problems with thyroid disease.  Review of Systems all the systems noncontributory to HPI.   Objective: Vital Signs: BP 131/82   Pulse 78   Ht 5' 6.5" (1.689 m)   Wt 194 lb (88 kg)   BMI 30.84 kg/m   Physical Exam Constitutional:      Appearance: She is well-developed.  HENT:     Head: Normocephalic.     Right Ear: External ear normal.     Left Ear: External ear normal. There is no  impacted cerumen.  Eyes:     Pupils: Pupils are equal, round, and reactive to light.  Neck:     Thyroid: No thyromegaly.     Trachea: No tracheal deviation.  Cardiovascular:     Rate and Rhythm: Normal rate.  Pulmonary:     Effort: Pulmonary effort is normal.  Abdominal:     Palpations: Abdomen is soft.  Musculoskeletal:     Cervical back: No rigidity.  Skin:    General: Skin is warm and dry.  Neurological:     Mental Status: She is alert and oriented to person, place, and time.  Psychiatric:        Behavior: Behavior normal.     Ortho Exam right knee 10 degree clinical valgus.  She has full extension flexes to 110 degrees.  Collateral ligaments are stable.  There is some mild lateral as well as medial joint line tenderness right knee with some crepitus with knee flexion extension.  Specialty Comments:  No specialty comments available.  Imaging: XR KNEE 3 VIEW RIGHT  Result Date: 04/27/2022 Standing AP both knees lateral right knee symptoms patella x-ray demonstrates moderate right knee osteoarthritis with 10 degrees valgus.  Opposite left knee is straight.  Right knee shows some  flattening of the condyle marginal osteophytes and mild subchondral sclerosis. Impression: Right knee moderate osteoarthritis with mild valgus.    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   Snoring 03/25/2015   Hypersomnia with sleep apnea 03/25/2015   Multinodular goiter  (nontoxic) 02/08/2012   Acute pancreatitis 10/26/2011   Nausea and vomiting in adult 10/26/2011   Dehydration 10/26/2011   Gastroenteritis 10/26/2011   Hypokalemia 10/26/2011   Elevated liver function tests 10/26/2011   Acid reflux    Thyroid disease    Past Medical History:  Diagnosis Date   Abnormal liver enzymes    Acid reflux    Arthritis    back and neck   Bloating    Bronchitis    Chest pain, unspecified    Diarrhea    Dyspepsia    Esophageal reflux    H/O acute pancreatitis    H/O hiatal hernia    Headache(784.0)    migraines   Hemorrhage of rectum and anus    Hypercholesteremia    Nausea    Sjogren's disease (HCC)    Spinal stenosis, cervical region    Thyroid disease    Thyroid nodule     Family History  Problem Relation Age of Onset   Colon cancer Mother    Pancreatic cancer Brother    Healthy Son    Healthy Daughter     Past Surgical History:  Procedure Laterality Date   ABDOMINAL HYSTERECTOMY  2007   total with BSO   CHOLECYSTECTOMY  1985   COLONOSCOPY     ECTOPIC PREGNANCY SURGERY  1984   ESOPHAGOGASTRODUODENOSCOPY     EUS  12/21/2011   Procedure: ESOPHAGEAL ENDOSCOPIC ULTRASOUND (EUS) RADIAL;  Surgeon: Willis Modena, MD;  Location: [facility];  Service: Endoscopy;  Laterality: N/A;   eye cautery     EYE SURGERY     lasik   EYE SURGERY Left    cataract extraction    KNEE ARTHROSCOPY Right 06/11/2020   torn meniscus    Social History   Occupational History   Not on file  Tobacco Use   Smoking status: Never    Passive exposure: Past   Smokeless tobacco: Never  Vaping Use   Vaping Use: Never used  Substance and Sexual Activity   Alcohol use: Yes    Comment: rarely    Drug use: No   Sexual activity: Yes    Birth control/protection: None

## 2022-05-10 ENCOUNTER — Ambulatory Visit (INDEPENDENT_AMBULATORY_CARE_PROVIDER_SITE_OTHER): Payer: 59

## 2022-05-10 VITALS — BP 130/80 | HR 77 | Temp 98.4°F

## 2022-05-10 DIAGNOSIS — Z23 Encounter for immunization: Secondary | ICD-10-CM | POA: Diagnosis not present

## 2022-05-10 NOTE — Progress Notes (Signed)
Patient presents today for second shingles vaccine.  

## 2022-05-10 NOTE — Patient Instructions (Signed)
Zoster Vaccine, Recombinant injection What is this medication? ZOSTER VACCINE (ZOS ter vak SEEN) is a vaccine used to reduce the risk of getting shingles. This vaccine is not used to treat shingles or nerve pain from shingles. This medicine may be used for other purposes; ask your health care provider or pharmacist if you have questions. COMMON BRAND NAME(S): SHINGRIX What should I tell my care team before I take this medication? They need to know if you have any of these conditions: cancer immune system problems an unusual or allergic reaction to Zoster vaccine, other medications, foods, dyes, or preservatives pregnant or trying to get pregnant breast-feeding How should I use this medication? This vaccine is injected into a muscle. It is given by a health care provider. A copy of Vaccine Information Statements will be given before each vaccination. Be sure to read this information carefully each time. This sheet may change often. Talk to your health care provider about the use of this vaccine in children. This vaccine is not approved for use in children. Overdosage: If you think you have taken too much of this medicine contact a poison control center or emergency room at once. NOTE: This medicine is only for you. Do not share this medicine with others. What if I miss a dose? Keep appointments for follow-up (booster) doses. It is important not to miss your dose. Call your health care provider if you are unable to keep an appointment. What may interact with this medication? medicines that suppress your immune system medicines to treat cancer steroid medicines like prednisone or cortisone This list may not describe all possible interactions. Give your health care provider a list of all the medicines, herbs, non-prescription drugs, or dietary supplements you use. Also tell them if you smoke, drink alcohol, or use illegal drugs. Some items may interact with your medicine. What should I watch for  while using this medication? Visit your health care provider regularly. This vaccine, like all vaccines, may not fully protect everyone. What side effects may I notice from receiving this medication? Side effects that you should report to your doctor or health care professional as soon as possible: allergic reactions (skin rash, itching or hives; swelling of the face, lips, or tongue) trouble breathing Side effects that usually do not require medical attention (report these to your doctor or health care professional if they continue or are bothersome): chills headache fever nausea pain, redness, or irritation at site where injected tiredness vomiting This list may not describe all possible side effects. Call your doctor for medical advice about side effects. You may report side effects to FDA at 1-800-FDA-1088. Where should I keep my medication? This vaccine is only given by a health care provider. It will not be stored at home. NOTE: This sheet is a summary. It may not cover all possible information. If you have questions about this medicine, talk to your doctor, pharmacist, or health care provider.  2023 Elsevier/Gold Standard (2021-07-23 00:00:00)  

## 2022-05-12 ENCOUNTER — Encounter: Payer: Self-pay | Admitting: Internal Medicine

## 2022-05-22 NOTE — Progress Notes (Unsigned)
Office Visit Note  Patient: [name] [name]             Date of Birth: November 05, 1955           MRN: [id number]             PCP: Glendale Chard, MD Referring: Glendale Chard, MD Visit Date: 06/02/2022 Occupation: @GUAROCC @  Subjective:  Swollen lymph node  History of Present Illness: [name] [name] is a 66 y.o. female with history of Sjogren's syndrome and osteoarthritis.  Patient is not currently taking any immunosuppressive agents.  Patient reports that she continues to have symptoms of persistent eye dryness.  She has not been experiencing any mouth dryness recently.  She denies any shortness of breath or pleuritic chest pain.  She has not noticed any new or worsening pulmonary symptoms.  She has been walking on a daily basis for exercise which she enjoys.  She has occasional aching in both hands but denies any joint swelling.  She has found taking glucosamine to be helpful at managing her arthralgias.  Of note she has noticed possible swollen lymph nodes on the left side of her neck.  She states that she notices swelling and discomfort especially when turning her head in this region off and on over the past couple of years.  She has never had an ultrasound evaluation or CT for further evaluation.  She denies any increased fatigue, fevers, or night sweats.  She denies any parotid swelling or tenderness.  She is not experiencing any hoarseness.   Activities of Daily Living:  Patient reports morning stiffness for 5-10 minutes.   Patient Denies nocturnal pain.  Difficulty dressing/grooming: Denies Difficulty climbing stairs: Denies Difficulty getting out of chair: Denies Difficulty using hands for taps, buttons, cutlery, and/or writing: Denies  Review of Systems  Constitutional:  Negative for fatigue.  HENT:  Negative for mouth sores and mouth dryness.   Eyes:  Positive for dryness.  Respiratory:  Negative for shortness of breath.   Cardiovascular:  Negative for chest pain and  palpitations.  Gastrointestinal:  Negative for blood in stool, constipation and diarrhea.  Endocrine: Negative for increased urination.  Genitourinary:  Negative for involuntary urination.  Musculoskeletal:  Positive for morning stiffness and muscle tenderness. Negative for joint pain, gait problem, joint pain, joint swelling, myalgias and myalgias.  Skin:  Negative for color change, rash, hair loss and sensitivity to sunlight.  Allergic/Immunologic: Negative for susceptible to infections.  Neurological:  Negative for dizziness and headaches.  Hematological:  Positive for swollen glands.  Psychiatric/Behavioral:  Negative for depressed mood and sleep disturbance. The patient is not nervous/anxious.     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   Snoring 03/25/2015   Hypersomnia with sleep apnea 03/25/2015   Multinodular goiter (nontoxic) 02/08/2012   Acute pancreatitis 10/26/2011   Nausea and vomiting in adult 10/26/2011   Dehydration 10/26/2011   Gastroenteritis 10/26/2011   Hypokalemia 10/26/2011   Elevated liver  function tests 10/26/2011   Acid reflux    Thyroid disease     Past Medical History:  Diagnosis Date   Abnormal liver enzymes    Acid reflux    Arthritis    back and neck   Bloating    Bronchitis    Chest pain, unspecified    Diarrhea    Dyspepsia    Esophageal reflux    H/O acute pancreatitis    H/O hiatal hernia    Headache(784.0)     migraines   Hemorrhage of rectum and anus    Hypercholesteremia    Nausea    Sjogren's disease (HCC)    Spinal stenosis, cervical region    Thyroid disease    Thyroid nodule     Family History  Problem Relation Age of Onset   Colon cancer Mother    Pancreatic cancer Brother    Healthy Son    Healthy Daughter    Past Surgical History:  Procedure Laterality Date   ABDOMINAL HYSTERECTOMY  2007   total with [place]   ESOPHAGOGASTRODUODENOSCOPY     EUS  12/21/2011   Procedure: ESOPHAGEAL ENDOSCOPIC ULTRASOUND (EUS) RADIAL;  Surgeon: Arta Silence, MD;  Location: [facility];  Service: Endoscopy;  Laterality: N/A;   eye cautery     EYE SURGERY     lasik   EYE SURGERY Left    cataract extraction    KNEE ARTHROSCOPY Right 06/11/2020   torn meniscus    Social History   Social History Narrative   Married.   Drinks occasional sweet tea.   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   Immunization History  Administered Date(s) Administered   PFIZER(Purple Top)SARS-COV-2 Vaccination 11/22/2019, 12/13/2019, 07/01/2020, 03/26/2021   Tdap 07/13/2017   Zoster Recombinat (Shingrix) 02/15/2022, 05/10/2022     Objective: Vital Signs: BP 125/82 (BP Location: Left Arm, Patient Position: Sitting, Cuff Size: Normal)   Pulse 73   Resp 15   Ht 5' 6"  (1.676 m)   Wt 196 lb 9.6 oz (89.2 kg)   BMI 31.73 kg/m    Physical Exam Vitals and nursing note reviewed.  Constitutional:      Appearance: She is well-developed.  HENT:     Head: Normocephalic and atraumatic.     Mouth/Throat:     Comments: No parotid swelling or tenderness. Eyes:     Conjunctiva/sclera: Conjunctivae normal.  Cardiovascular:     Rate and Rhythm: Normal rate and regular rhythm.     Heart sounds: Normal heart sounds.  Pulmonary:     Effort: Pulmonary effort is normal.     Breath sounds: Normal breath sounds.   Abdominal:     General: Bowel sounds are normal.     Palpations: Abdomen is soft.  Musculoskeletal:     Cervical back: Normal range of motion.  Lymphadenopathy:     Cervical: Cervical adenopathy (left tender small submandibular node palpable) present.  Skin:    General: Skin is warm and dry.     Capillary Refill: Capillary refill takes less than 2 seconds.  Neurological:     Mental Status: She is alert and oriented to person, place, and time.  Psychiatric:        Behavior: Behavior normal.      Musculoskeletal Exam: C-spine has good range of motion with no discomfort.  Shoulder joints, elbow joints, wrist joints, MCPs, PIPs, DIPs have  good range of motion with no synovitis.  Complete fist formation noted bilaterally.  Some PIP and DIP thickening consistent with osteoarthritis of both hands.  Hip joints have good range of motion with no groin pain.  Knee joints have good range of motion with no warmth or effusion.  Ankle joints have good range of motion with no tenderness or joint swelling.  CDAI Exam: CDAI Score: -- Patient Global: --; Provider Global: -- Swollen: --; Tender: -- Joint Exam 06/02/2022   No joint exam has been documented for this visit   There is currently no information documented on the homunculus. Go to the Rheumatology activity and complete the homunculus joint exam.  Investigation: No additional findings.  Imaging: No results found.  Recent Labs: Lab Results  Component Value Date   WBC 5.6 11/26/2021   HGB 13.6 11/26/2021   PLT 237 11/26/2021   NA 142 11/26/2021   K 4.8 11/26/2021   CL 107 11/26/2021   CO2 28 11/26/2021   GLUCOSE 81 11/26/2021   BUN 16 11/26/2021   CREATININE 0.77 11/26/2021   BILITOT 0.9 11/26/2021   ALKPHOS 64 01/09/2021   AST 15 11/26/2021   ALT 13 11/26/2021   PROT 7.0 11/26/2021   PROT 7.1 11/26/2021   ALBUMIN 3.6 01/09/2021   CALCIUM 10.0 11/26/2021   GFRAA 88 09/29/2020    Speciality Comments: PLQ eye exam:  08/27/2019 normal. [facility]. Follow up in 6 months. PLQ started 07/2019  Procedures:  No procedures performed Allergies: Morphine and related, Other, and Sulfa antibiotics   Assessment / Plan:     Visit Diagnoses: Sjogren's syndrome with other organ involvement ([place]) - ANA 1: 80 speckled, positive Ro antibody, history of dry eyes and dry skin:  Patient continues to have very mild sicca symptoms to the point that she is not using any over-the-counter products for symptomatic relief.  She is not currently on taking any immunosuppressive agents.  She has very mild eye dryness and is not aware of any mouth dryness at this time.  She has not had any parotid swelling or tenderness.  Of note she often notices intermittent swelling of a lymph node on the left side of her neck.  On examination today there is a questionable enlargement of a submandibular lymph node on the left side of her neck.  Swelling of this lymph node has been episodic and is often tender to palpation.  In the past she has not had a CT of her neck or ultrasound for further evaluation.   Discussed the 4-14 fold increased risk for developing lymphoma in patients with Sjogren's syndrome. She has not been experiencing any fevers, increased fatigue, night sweats, or hoarseness.  No increased mouth dryness or oral ulcers. SPEP did not reveal any abnormal proteins on 11/26/2021.  ESR and CBC within normal limits on 11/26/2021.  The following lab work will be obtained today for further evaluation.  I would also recommend proceeding with an ultrasound for further evaluation.  She plans on reaching out to her PCP for further evaluation and management. She has no signs of inflammatory arthritis at this time.  No synovitis was noted on examination today.  She is not experiencing any new or worsening pulmonary symptoms.  Her lungs were clear to auscultation today.  Discussed the diagnosis of Sjogren's syndrome today in detail and all questions were  addressed.  I also reviewed lab work from 11/26/2021 today in detail. She will notify us if she develops any new or worsening  symptoms.  We will call her with the following lab results once complete.  She will follow-up in the office in 5 months or sooner if needed pending workup for cervical lymphadenopathy.  - Plan: CBC with Differential/Platelet, COMPLETE METABOLIC PANEL WITH GFR, Urinalysis, Routine w reflex microscopic, C3 and C4, Sedimentation rate, ANA, Sjogrens syndrome-A extractable nuclear antibody, Rheumatoid factor, Serum protein electrophoresis with reflex  High risk medication use -  D/c Plaquenil 200 mg p.o. twice daily for about 3 months and then discontinued due to not noticed any clinical benefit.  She does not currently require immunosuppressive therapy.  Plan: CBC with Differential/Platelet, COMPLETE METABOLIC PANEL WITH GFR  Antineutrophil cytoplasmic antibody (ANCA) positive - Patient has no clinical features of vasculitis.  Sed rate has been normal.   Primary osteoarthritis of both hands: She experiences occasional discomfort in both hands due to ongoing osteoarthritis.  She had no tenderness or synovitis on examination today.  Discussed the importance of joint protection and muscle strengthening.  Effusion, right knee: Resolved.  No recurrence.  ESR within normal limits on 11/26/2021.  ESR will be rechecked today.  Primary osteoarthritis of both knees - Dr. Lorin Mercy.  Good range of motion of both knee joints on examination today.  No warmth or effusion was noted.  She is been walking on a daily basis for exercise which she enjoys.  She is also been taking glucosamine which she feels has been helping with her joint pain and stiffness.  Other medical conditions are listed as follows:  History of sleep apnea  History of thyroid disease  Other fatigue: She has not been experiencing any fatigue recently.  Orders: Orders Placed This Encounter  Procedures   CBC with  Differential/Platelet   COMPLETE METABOLIC PANEL WITH GFR   Urinalysis, Routine w reflex microscopic   C3 and C4   Sedimentation rate   ANA   Sjogrens syndrome-A extractable nuclear antibody   Rheumatoid factor   Serum protein electrophoresis with reflex   No orders of the defined types were placed in this encounter.    Follow-Up Instructions: Return in about 5 months (around 11/02/2022) for Sjogren's syndrome, Osteoarthritis.   Ofilia Neas, PA-C  Note - This record has been created using Dragon software.  Chart creation errors have been sought, but may not always  have been located. Such creation errors do not reflect on  the standard of medical care.

## 2022-06-02 ENCOUNTER — Encounter: Payer: Self-pay | Admitting: Physician Assistant

## 2022-06-02 ENCOUNTER — Ambulatory Visit: Payer: 59 | Attending: Physician Assistant | Admitting: Physician Assistant

## 2022-06-02 VITALS — BP 125/82 | HR 73 | Resp 15 | Ht 66.0 in | Wt 196.6 lb

## 2022-06-02 DIAGNOSIS — M19041 Primary osteoarthritis, right hand: Secondary | ICD-10-CM | POA: Diagnosis not present

## 2022-06-02 DIAGNOSIS — M170 Bilateral primary osteoarthritis of knee: Secondary | ICD-10-CM

## 2022-06-02 DIAGNOSIS — R5383 Other fatigue: Secondary | ICD-10-CM

## 2022-06-02 DIAGNOSIS — M3509 Sicca syndrome with other organ involvement: Secondary | ICD-10-CM | POA: Diagnosis not present

## 2022-06-02 DIAGNOSIS — M19042 Primary osteoarthritis, left hand: Secondary | ICD-10-CM

## 2022-06-02 DIAGNOSIS — M25461 Effusion, right knee: Secondary | ICD-10-CM

## 2022-06-02 DIAGNOSIS — R768 Other specified abnormal immunological findings in serum: Secondary | ICD-10-CM

## 2022-06-02 DIAGNOSIS — Z8639 Personal history of other endocrine, nutritional and metabolic disease: Secondary | ICD-10-CM

## 2022-06-02 DIAGNOSIS — Z8669 Personal history of other diseases of the nervous system and sense organs: Secondary | ICD-10-CM

## 2022-06-02 DIAGNOSIS — Z79899 Other long term (current) drug therapy: Secondary | ICD-10-CM

## 2022-06-03 NOTE — Progress Notes (Signed)
CBC and CMP WNL. ESR WNL. UA normal.  RF negative.  Complements WNL.

## 2022-06-06 LAB — URINALYSIS, ROUTINE W REFLEX MICROSCOPIC
Bilirubin Urine: NEGATIVE
Glucose, UA: NEGATIVE
Hgb urine dipstick: NEGATIVE
Ketones, ur: NEGATIVE
Leukocytes,Ua: NEGATIVE
Nitrite: NEGATIVE
Protein, ur: NEGATIVE
Specific Gravity, Urine: 1.016 (ref 1.001–1.035)
pH: 5.5 (ref 5.0–8.0)

## 2022-06-06 LAB — PROTEIN ELECTROPHORESIS, SERUM, WITH REFLEX
Albumin ELP: 4.4 g/dL (ref 3.8–4.8)
Alpha 1: 0.3 g/dL (ref 0.2–0.3)
Alpha 2: 0.5 g/dL (ref 0.5–0.9)
Beta 2: 0.3 g/dL (ref 0.2–0.5)
Beta Globulin: 0.4 g/dL (ref 0.4–0.6)
Gamma Globulin: 0.8 g/dL (ref 0.8–1.7)
Total Protein: 6.8 g/dL (ref 6.1–8.1)

## 2022-06-06 LAB — COMPLETE METABOLIC PANEL WITH GFR
AG Ratio: 1.9 (calc) (ref 1.0–2.5)
ALT: 14 U/L (ref 6–29)
AST: 15 U/L (ref 10–35)
Albumin: 4.7 g/dL (ref 3.6–5.1)
Alkaline phosphatase (APISO): 78 U/L (ref 37–153)
BUN: 17 mg/dL (ref 7–25)
CO2: 29 mmol/L (ref 20–32)
Calcium: 9.8 mg/dL (ref 8.6–10.4)
Chloride: 104 mmol/L (ref 98–110)
Creat: 0.93 mg/dL (ref 0.50–1.05)
Globulin: 2.5 g/dL (calc) (ref 1.9–3.7)
Glucose, Bld: 92 mg/dL (ref 65–99)
Potassium: 4 mmol/L (ref 3.5–5.3)
Sodium: 141 mmol/L (ref 135–146)
Total Bilirubin: 0.9 mg/dL (ref 0.2–1.2)
Total Protein: 7.2 g/dL (ref 6.1–8.1)
eGFR: 69 mL/min/{1.73_m2} (ref >=60)

## 2022-06-06 LAB — CBC WITH DIFFERENTIAL/PLATELET
Absolute Monocytes: 502 cells/uL (ref 200–950)
Basophils Absolute: 34 cells/uL (ref 0–200)
Basophils Relative: 0.4 %
Eosinophils Absolute: 136 cells/uL (ref 15–500)
Eosinophils Relative: 1.6 %
HCT: 43.4 % (ref 35.0–45.0)
Hemoglobin: 14.4 g/dL (ref 11.7–15.5)
Lymphs Abs: 1964 cells/uL (ref 850–3900)
MCH: 28.4 pg (ref 27.0–33.0)
MCHC: 33.2 g/dL (ref 32.0–36.0)
MCV: 85.6 fL (ref 80.0–100.0)
MPV: 10.5 fL (ref 7.5–12.5)
Monocytes Relative: 5.9 %
Neutro Abs: 5865 cells/uL (ref 1500–7800)
Neutrophils Relative %: 69 %
Platelets: 230 10*3/uL (ref 140–400)
RBC: 5.07 10*6/uL (ref 3.80–5.10)
RDW: 13.4 % (ref 11.0–15.0)
Total Lymphocyte: 23.1 %
WBC: 8.5 10*3/uL (ref 3.8–10.8)

## 2022-06-06 LAB — RHEUMATOID FACTOR: Rheumatoid fact SerPl-aCnc: <14 IU/mL (ref <14)

## 2022-06-06 LAB — ANTI-NUCLEAR AB-TITER (ANA TITER): ANA Titer 1: 1:80 {titer} — ABNORMAL HIGH

## 2022-06-06 LAB — C3 AND C4
C3 Complement: 119 mg/dL (ref 83–193)
C4 Complement: 45 mg/dL (ref 15–57)

## 2022-06-06 LAB — SEDIMENTATION RATE: Sed Rate: 9 mm/h (ref 0–30)

## 2022-06-06 LAB — SJOGRENS SYNDROME-A EXTRACTABLE NUCLEAR ANTIBODY: SSA (Ro) (ENA) Antibody, IgG: >8 AI — AB

## 2022-06-06 LAB — ANA: Anti Nuclear Antibody (ANA): POSITIVE — AB

## 2022-06-06 NOTE — Progress Notes (Signed)
ANA remains positive.  Ro antibody remains positive.   SPEP did not reveal any abnormal protein bands.

## 2022-06-24 ENCOUNTER — Ambulatory Visit
Admission: RE | Admit: 2022-06-24 | Discharge: 2022-06-24 | Disposition: A | Discharge status: Home / Self Care | Payer: 59 | Source: Ambulatory Visit | Attending: Internal Medicine | Admitting: Internal Medicine

## 2022-06-24 ENCOUNTER — Ambulatory Visit: Payer: 59 | Admitting: Internal Medicine

## 2022-06-24 ENCOUNTER — Encounter: Payer: Self-pay | Admitting: Internal Medicine

## 2022-06-24 VITALS — BP 126/74 | HR 74 | Temp 98.3°F | Ht 66.0 in | Wt 192.0 lb

## 2022-06-24 DIAGNOSIS — Z23 Encounter for immunization: Secondary | ICD-10-CM | POA: Diagnosis not present

## 2022-06-24 DIAGNOSIS — E6609 Other obesity due to excess calories: Secondary | ICD-10-CM

## 2022-06-24 DIAGNOSIS — M542 Cervicalgia: Secondary | ICD-10-CM

## 2022-06-24 DIAGNOSIS — E041 Nontoxic single thyroid nodule: Secondary | ICD-10-CM | POA: Diagnosis not present

## 2022-06-24 DIAGNOSIS — G4733 Obstructive sleep apnea (adult) (pediatric): Secondary | ICD-10-CM | POA: Diagnosis not present

## 2022-06-24 DIAGNOSIS — Z6830 Body mass index (BMI) 30.0-30.9, adult: Secondary | ICD-10-CM

## 2022-06-24 NOTE — Progress Notes (Unsigned)
I,Tianna Badgett,acting as a Education administrator for Maximino Greenland, MD.,have documented all relevant documentation on the behalf of Maximino Greenland, MD,as directed by  Maximino Greenland, MD while in the presence of Maximino Greenland, MD.   Subjective:     Patient ID: [name] [name] , female    DOB: Apr 21, 1956 , 66 y.o.   MRN: [id number]   Chief Complaint  Patient presents with   Neck Pain         HPI  Patient presents today for neck pain. She has had chronic neck pain for many years, denies b/l UE weakness and/or paresthesias. However, she is now having sharp pains in her head. She is followed by Ortho; however, she has not mentioned these sx during her visits. Denies fall/trauma. She has been going to a chiropractor; however, her sx have not improved.   Neck Pain  This is a new problem. The current episode started more than 1 month ago. The problem occurs constantly. The problem has been gradually worsening. The pain is associated with nothing. The quality of the pain is described as shooting. The pain is at a severity of 9/10. The pain is moderate. The symptoms are aggravated by position and twisting. The pain is Same all the time. She has tried nothing for the symptoms.     Past Medical History:  Diagnosis Date   Abnormal liver enzymes    Acid reflux    Arthritis    back and neck   Bloating    Bronchitis    Chest pain, unspecified    Diarrhea    Dyspepsia    Esophageal reflux    H/O acute pancreatitis    H/O hiatal hernia    Headache(784.0)    migraines   Hemorrhage of rectum and anus    Hypercholesteremia    Nausea    Sjogren's disease (HCC)    Spinal stenosis, cervical region    Thyroid disease    Thyroid nodule      Family History  Problem Relation Age of Onset   Colon cancer Mother    Pancreatic cancer Brother    Healthy Son    Healthy Daughter      Current Outpatient Medications:    Cholecalciferol (VITAMIN D3) 250 MCG (10000 UT) capsule, Take 10,000 Units by  mouth daily., Disp: , Rfl:    Cyanocobalamin (VITAMIN B 12 PO), Take by mouth., Disp: , Rfl:    GINSENG CHINESE RED PO, Take by mouth., Disp: , Rfl:    Glucosamine-Chondroitin-MSM 375-300-237.5 MG CAPS, Take by mouth., Disp: , Rfl:    Omega-3 Fatty Acids (FISH OIL PO), Take by mouth 2 (two) times daily., Disp: , Rfl:    omeprazole-sodium bicarbonate (ZEGERID) 40-1100 MG capsule, Take 1 capsule by mouth as needed., Disp: , Rfl:    Peppermint Oil (IBGARD) 90 MG CPCR, , Disp: , Rfl:    VITAMIN D PO, Take 2,000 Units by mouth daily., Disp: , Rfl:    Allergies  Allergen Reactions   Morphine And Related Shortness Of Breath   Other     TRILTYE- stomach upset   Sulfa Antibiotics Hives     Review of Systems  Constitutional: Negative.   HENT: Negative.    Eyes: Negative.   Respiratory: Negative.    Cardiovascular: Negative.   Gastrointestinal: Negative.   Musculoskeletal:  Positive for neck pain.     Today's Vitals   06/24/22 1513  BP: 126/74  Pulse: 74  Temp: 98.3 F (36.8 C)  TempSrc: Oral  Weight: 192 lb (87.1 kg)  Height: 5\' 6"  (1.676 m)   Body mass index is 30.99 kg/m.  Wt Readings from Last 3 Encounters:  06/24/22 192 lb (87.1 kg)  06/02/22 196 lb 9.6 oz (89.2 kg)  04/27/22 194 lb (88 kg)    Objective:  Physical Exam Vitals and nursing note reviewed.  Constitutional:      Appearance: Normal appearance.  HENT:     Head: Normocephalic and atraumatic.     Nose:     Comments: Masked     Mouth/Throat:     Comments: Masked  Eyes:     Extraocular Movements: Extraocular movements intact.  Neck:     Comments: Tight trap mm b/l, praaspinal mm are tender to palpation Cardiovascular:     Rate and Rhythm: Normal rate and regular rhythm.     Heart sounds: Normal heart sounds.  Pulmonary:     Effort: Pulmonary effort is normal.     Breath sounds: Normal breath sounds.  Musculoskeletal:     Cervical back: Normal range of motion.  Skin:    General: Skin is warm.   Neurological:     General: No focal deficit present.     Mental Status: She is alert.  Psychiatric:        Mood and Affect: Mood normal.        Behavior: Behavior normal.       Assessment And Plan:     1. Neck pain Comments: Chronic, previous MRI results reviewed. She likely needs repeat MRI. Will check cervical spine x-ray today, she is in agreement w/ tx plan.  - DG Cervical Spine Complete; Future  2. Thyroid nodule Comments: Previous u/s results reviewed. She had bx in 2007. I will schedule her for repeat thyroid u/s. - US THYROID; Future  3. OSA (obstructive sleep apnea) Comments: Admits non-compliance with CPAP. She is reminded her vital organs are not well oxygenated when she does not wear her CPAP.  4. Class 1 obesity due to excess calories without serious comorbidity with body mass index (BMI) of 30.0 to 30.9 in adult Comments: She is advised to aim for at least 150 minutes of exercise per week.  5. Need for influenza vaccination - Flu Vaccine QUAD 6+ mos PF IM (Fluarix Quad PF)  Patient was given opportunity to ask questions. Patient verbalized understanding of the plan and was able to repeat key elements of the plan. All questions were answered to their satisfaction.   I, Maximino Greenland, MD, have reviewed all documentation for this visit. The documentation on 06/24/22 for the exam, diagnosis, procedures, and orders are all accurate and complete.   IF YOU HAVE BEEN REFERRED TO A SPECIALIST, IT MAY TAKE 1-2 WEEKS TO SCHEDULE/PROCESS THE REFERRAL. IF YOU HAVE NOT HEARD FROM US/SPECIALIST IN TWO WEEKS, PLEASE GIVE [place] A CALL AT [phone].   THE PATIENT IS ENCOURAGED TO PRACTICE SOCIAL DISTANCING DUE TO THE COVID-19 PANDEMIC.

## 2022-06-24 NOTE — Patient Instructions (Signed)
Pain Without a Known Cause Pain can occur in any part of the body and can range from mild to severe. Sometimes no cause can be found for pain. Some common types of pain that can occur without a known cause include: Headache. Back pain. Abdominal pain. Neck pain. Your health care provider may do tests to try to find the cause of your pain. If no cause is found, your health care provider will treat you for pain without a known cause. In some cases, your health care provider may do more tests, repeat tests that have already been done, and look further for a possible cause. Follow these instructions at home: Medicines Take over-the-counter and prescription medicines only as told by your health care provider. Ask your health care provider if the medicine prescribed to you: Requires you to avoid driving or using machinery. Can cause constipation. You may need to take these actions to prevent or treat constipation: Drink enough fluid to keep your urine pale yellow. Take over-the-counter or prescription medicines. Eat foods that are high in fiber, such as beans, whole grains, and fresh fruits and vegetables. Limit foods that are high in fat and processed sugars, such as fried and sweet foods. Managing pain, stiffness, and swelling     If directed, put ice on the painful area. To do this: Put ice in a plastic bag. Place a towel between your skin and the bag. Leave the ice on for 20 minutes, 2-3 times a day. Remove the ice if your skin turns bright red. This is very important. If you cannot feel pain, heat, or cold, you have a greater risk of damage to the area. If directed, apply heat to the affected area. Use the heat source that your health care provider recommends, such as a moist heat pack or a heating pad. Place a towel between your skin and the heat source. Leave the heat on for 20-30 minutes. Remove the heat if your skin turns bright red. This is especially important if you are unable to  feel pain, heat, or cold. You may have a greater risk of getting burned. General instructions  Stop any activities that cause pain. Rest as told by your health care provider during periods of severe pain. Return to your normal activities as told by your health care provider. Ask your health care provider what activities are safe for you. Exercise regularly. Reduce your stress with activities such as yoga or meditation. Talk with your health care provider about other ways to reduce stress. Eat a balanced diet that includes fruits and vegetables, whole grains, lean meat, and low-fat dairy. Talk with your health care provider if you have any questions about your diet. Contact a health care provider if: You have continuing pain, and no reason can be found for it. Your symptoms do not get better after treatment. Get help right away if: Your pain is making you want to harm yourself. Get help right away if you feel like you may hurt yourself or others, or have thoughts about taking your own life. Go to your nearest emergency room or: Call 911. Call the National Suicide Prevention Lifeline at 251-320-2327 or 988. This is open 24 hours a day. Text the Crisis Text Line at 508-246-5873. Summary Pain can occur in any part of the body and can range from mild to severe. Your health care provider may do tests to try to find the cause of your pain. If no cause is found, your health care provider may  diagnose you with pain without a known cause. To help your pain, take medicines as told by your health care provider, apply ice or heat, exercise, reduce stress, and eat a healthy diet. This information is not intended to replace advice given to you by your health care provider. Make sure you discuss any questions you have with your health care provider. Document Revised: 04/21/2021 Document Reviewed: 04/21/2021 Elsevier Patient Education  [place].

## 2022-06-29 ENCOUNTER — Ambulatory Visit (HOSPITAL_BASED_OUTPATIENT_CLINIC_OR_DEPARTMENT_OTHER)
Admission: RE | Admit: 2022-06-29 | Discharge: 2022-06-29 | Disposition: A | Discharge status: Home / Self Care | Payer: 59 | Source: Ambulatory Visit | Attending: Internal Medicine | Admitting: Internal Medicine

## 2022-06-29 ENCOUNTER — Encounter: Payer: Self-pay | Admitting: Internal Medicine

## 2022-06-29 DIAGNOSIS — E041 Nontoxic single thyroid nodule: Secondary | ICD-10-CM | POA: Diagnosis present

## 2022-06-30 ENCOUNTER — Emergency Department (HOSPITAL_BASED_OUTPATIENT_CLINIC_OR_DEPARTMENT_OTHER)
Admission: EM | Admit: 2022-06-30 | Discharge: 2022-06-30 | Disposition: A | Discharge status: Home / Self Care | Payer: 59 | Attending: Emergency Medicine | Admitting: Emergency Medicine

## 2022-06-30 ENCOUNTER — Encounter (HOSPITAL_BASED_OUTPATIENT_CLINIC_OR_DEPARTMENT_OTHER): Payer: Self-pay

## 2022-06-30 ENCOUNTER — Emergency Department (HOSPITAL_BASED_OUTPATIENT_CLINIC_OR_DEPARTMENT_OTHER): Payer: 59 | Admitting: Radiology

## 2022-06-30 ENCOUNTER — Other Ambulatory Visit: Payer: Self-pay

## 2022-06-30 DIAGNOSIS — R61 Generalized hyperhidrosis: Secondary | ICD-10-CM | POA: Insufficient documentation

## 2022-06-30 DIAGNOSIS — R0602 Shortness of breath: Secondary | ICD-10-CM | POA: Insufficient documentation

## 2022-06-30 DIAGNOSIS — R030 Elevated blood-pressure reading, without diagnosis of hypertension: Secondary | ICD-10-CM | POA: Insufficient documentation

## 2022-06-30 DIAGNOSIS — R072 Precordial pain: Secondary | ICD-10-CM | POA: Diagnosis not present

## 2022-06-30 DIAGNOSIS — R0789 Other chest pain: Secondary | ICD-10-CM | POA: Diagnosis present

## 2022-06-30 LAB — BASIC METABOLIC PANEL
Anion gap: 12 (ref 5–15)
BUN: 15 mg/dL (ref 8–23)
CO2: 24 mmol/L (ref 22–32)
Calcium: 10 mg/dL (ref 8.9–10.3)
Chloride: 105 mmol/L (ref 98–111)
Creatinine, Ser: 0.84 mg/dL (ref 0.44–1.00)
GFR, Estimated: >60 mL/min (ref >60)
Glucose, Bld: 105 mg/dL — ABNORMAL HIGH (ref 70–99)
Potassium: 4.2 mmol/L (ref 3.5–5.1)
Sodium: 141 mmol/L (ref 135–145)

## 2022-06-30 LAB — CBC
HCT: 41.7 % (ref 36.0–46.0)
Hemoglobin: 14 g/dL (ref 12.0–15.0)
MCH: 28.4 pg (ref 26.0–34.0)
MCHC: 33.6 g/dL (ref 30.0–36.0)
MCV: 84.6 fL (ref 80.0–100.0)
Platelets: 243 10*3/uL (ref 150–400)
RBC: 4.93 MIL/uL (ref 3.87–5.11)
RDW: 13.4 % (ref 11.5–15.5)
WBC: 6.9 10*3/uL (ref 4.0–10.5)
nRBC: 0 % (ref 0.0–0.2)

## 2022-06-30 LAB — TROPONIN I (HIGH SENSITIVITY)
Troponin I (High Sensitivity): <2 ng/L (ref <18)
Troponin I (High Sensitivity): <2 ng/L (ref <18)

## 2022-06-30 MED ORDER — FAMOTIDINE 20 MG PO TABS
20.0000 mg | ORAL_TABLET | Freq: Once | ORAL | Status: AC
  Administered 2022-06-30: 20 mg via ORAL
  Filled 2022-06-30: qty 1

## 2022-06-30 MED ORDER — ALUM & MAG HYDROXIDE-SIMETH 200-200-20 MG/5ML PO SUSP
30.0000 mL | Freq: Once | ORAL | Status: AC
  Administered 2022-06-30: 30 mL via ORAL
  Filled 2022-06-30: qty 30

## 2022-06-30 MED ORDER — ACETAMINOPHEN 500 MG PO TABS
1000.0000 mg | ORAL_TABLET | Freq: Once | ORAL | Status: AC
  Administered 2022-06-30: 1000 mg via ORAL
  Filled 2022-06-30: qty 2

## 2022-06-30 NOTE — Discharge Instructions (Addendum)
It was our pleasure to provide your ER care today - we hope that you feel better.  For recent chest pain, follow up closely with cardiology in the next 1-2 weeks - a referral was made.   Also follow up with your doctor in the next couple weeks regarding your blood pressure which is mildly high today.  Return to ER if worse, new symptoms, persistent/recurrent chest pain, trouble breathing, or other concern.

## 2022-06-30 NOTE — ED Provider Notes (Addendum)
[facility] EMERGENCY DEPT Provider Note   CSN: [id number] Arrival date & time: 06/30/22  1235     History  Chief Complaint  Patient presents with   Shortness of Breath   Chest Pain    [name] [name] is a 66 y.o. female.  Pt with c/o midline, lower chest 'pinching' type chest discomfort while at store earlier today. Symptoms acute onset, moderate, non radiating. Felt mildly sob, and sweaty. Lasted ~ 2 minutes. Pt denies other chest pain or discomfort. No exertional chest pain. No unusual doe or fatigue. Denies personal or fam hx cad. No persistent and/or pleuritic chest pain. No leg pain or swelling. No hx dvt or pe. No cough or uri symptoms. No heartburn or indigestion. No abd pain. Remote hx cholecystectomy.   The history is provided by the patient and medical records.  Shortness of Breath Associated symptoms: chest pain   Associated symptoms: no abdominal pain, no cough, no fever, no headaches, no neck pain, no rash and no vomiting   Chest Pain Associated symptoms: shortness of breath   Associated symptoms: no abdominal pain, no back pain, no cough, no fever, no headache, no nausea, no palpitations and no vomiting        Home Medications Prior to Admission medications   Medication Sig Start Date End Date Taking? Authorizing Provider  Cholecalciferol (VITAMIN D3) 250 MCG (10000 UT) capsule Take 10,000 Units by mouth daily.    [provider]  Cyanocobalamin (VITAMIN B 12 PO) Take by mouth.    [provider]  GINSENG CHINESE RED PO Take by mouth.    [provider]  Glucosamine-Chondroitin-MSM 375-300-237.5 MG CAPS Take by mouth.    [provider]  Omega-3 Fatty Acids (FISH OIL PO) Take by mouth 2 (two) times daily.    [provider]  omeprazole-sodium bicarbonate (ZEGERID) 40-1100 MG capsule Take 1 capsule by mouth as needed.    [provider]  Peppermint Oil (IBGARD) 90 MG CPCR  01/03/22   [provider]  VITAMIN D PO Take 2,000 Units by mouth daily.    [provider]      Allergies    Morphine and related, Other, and Sulfa antibiotics    Review of Systems   Review of Systems  Constitutional:  Negative for chills and fever.  Eyes:  Negative for redness.  Respiratory:  Positive for shortness of breath. Negative for cough.   Cardiovascular:  Positive for chest pain. Negative for palpitations and leg swelling.  Gastrointestinal:  Negative for abdominal pain, nausea and vomiting.  Genitourinary:  Negative for dysuria and flank pain.  Musculoskeletal:  Negative for back pain and neck pain.  Skin:  Negative for rash.  Neurological:  Negative for headaches.  Hematological:  Does not bruise/bleed easily.  Psychiatric/Behavioral:  Negative for confusion.     Physical Exam Updated Vital Signs BP 138/80 (BP Location: Right Arm)   Pulse 78   Temp 97.6 F (36.4 C)   Resp 16   Ht 1.676 m (5\' 6" )   Wt 87.1 kg   SpO2 99%   BMI 30.99 kg/m  Physical Exam Vitals and nursing note reviewed.  Constitutional:      Appearance: Normal appearance. She is well-developed.  HENT:     Head: Atraumatic.     Nose: Nose normal.     Mouth/Throat:     Mouth: Mucous membranes are moist.  Eyes:     General: No scleral icterus.    Conjunctiva/sclera:  Conjunctivae normal.  Neck:     Trachea: No tracheal deviation.  Cardiovascular:     Rate and Rhythm: Normal rate and regular rhythm.     Pulses: Normal pulses.     Heart sounds: Normal heart sounds. No murmur heard.    No friction rub. No gallop.  Pulmonary:     Effort: Pulmonary effort is normal. No respiratory distress.     Breath sounds: Normal breath sounds.  Chest:     Chest wall: No tenderness.  Abdominal:     General: Bowel sounds are normal. There is no distension.     Palpations: Abdomen is soft.     Tenderness: There is no abdominal tenderness.  Genitourinary:    Comments: No cva tenderness.  Musculoskeletal:         General: No swelling or tenderness.     Cervical back: Normal range of motion and neck supple. No rigidity. No muscular tenderness.     Right lower leg: No edema.     Left lower leg: No edema.  Skin:    General: Skin is warm and dry.     Findings: No rash.  Neurological:     Mental Status: She is alert.     Comments: Alert, speech normal.   Psychiatric:        Mood and Affect: Mood normal.     ED Results / Procedures / Treatments   Labs (all labs ordered are listed, but only abnormal results are displayed) Results for orders placed or performed during the hospital encounter of 61/60/73  Basic metabolic panel  Result Value Ref Range   Sodium 141 135 - 145 mmol/L   Potassium 4.2 3.5 - 5.1 mmol/L   Chloride 105 98 - 111 mmol/L   CO2 24 22 - 32 mmol/L   Glucose, Bld 105 (H) 70 - 99 mg/dL   BUN 15 8 - 23 mg/dL   Creatinine, Ser 0.84 0.44 - 1.00 mg/dL   Calcium 10.0 8.9 - 10.3 mg/dL   GFR, Estimated >60 >60 mL/min   Anion gap 12 5 - 15  CBC  Result Value Ref Range   WBC 6.9 4.0 - 10.5 K/uL   RBC 4.93 3.87 - 5.11 MIL/uL   Hemoglobin 14.0 12.0 - 15.0 g/dL   HCT 41.7 36.0 - 46.0 %   MCV 84.6 80.0 - 100.0 fL   MCH 28.4 26.0 - 34.0 pg   MCHC 33.6 30.0 - 36.0 g/dL   RDW 13.4 11.5 - 15.5 %   Platelets 243 150 - 400 K/uL   nRBC 0.0 0.0 - 0.2 %  Troponin I (High Sensitivity)  Result Value Ref Range   Troponin I (High Sensitivity) <2 <18 ng/L  Troponin I (High Sensitivity)  Result Value Ref Range   Troponin I (High Sensitivity) <2 <18 ng/L   DG Chest 2 View  Result Date: 06/30/2022 CLINICAL DATA:  Chest pain EXAM: CHEST - 2 VIEW COMPARISON:  01/03/2014 FINDINGS: The heart size and mediastinal contours are within normal limits. Both lungs are clear. The visualized skeletal structures are unremarkable. IMPRESSION: Normal study. Electronically Signed   By: Rolm Baptise M.D.   On: 06/30/2022 13:35   EKG EKG Interpretation  Date/Time:  Thursday June 30 2022 12:52:01  EDT Ventricular Rate:  74 PR Interval:  176 QRS Duration: 84 QT Interval:  368 QTC Calculation: 408 R Axis:   40 Text Interpretation: Normal sinus rhythm Normal ECG Confirmed by Lajean Saver [phone]) on 06/30/2022 1:02:32 PM  Radiology  DG Chest 2 View  Result Date: 06/30/2022 CLINICAL DATA:  Chest pain EXAM: CHEST - 2 VIEW COMPARISON:  01/03/2014 FINDINGS: The heart size and mediastinal contours are within normal limits. Both lungs are clear. The visualized skeletal structures are unremarkable. IMPRESSION: Normal study. Electronically Signed   By: Charlett Nose M.D.   On: 06/30/2022 13:35  Procedures Procedures    Medications Ordered in ED Medications  famotidine (PEPCID) tablet 20 mg (has no administration in time range)  alum & mag hydroxide-simeth (MAALOX/MYLANTA) 200-200-20 MG/5ML suspension 30 mL (has no administration in time range)  acetaminophen (TYLENOL) tablet 1,000 mg (has no administration in time range)    ED Course/ Medical Decision Making/ A&P                           Medical Decision Making Problems Addressed: Elevated blood pressure reading: acute illness or injury Precordial chest pain: acute illness or injury with systemic symptoms that poses a threat to life or bodily functions  Amount and/or Complexity of Data Reviewed External Data Reviewed: notes. Labs: ordered. Decision-making details documented in ED Course. Radiology: ordered and independent interpretation performed. Decision-making details documented in ED Course. ECG/medicine tests: ordered and independent interpretation performed. Decision-making details documented in ED Course.  Risk OTC drugs. Decision regarding hospitalization.   Iv ns. Continuous pulse ox and cardiac monitoring. Labs ordered/sent. Imaging ordered.   Diff dx includes acs, msk cp, gi cp, etc. - dispo decision including potential need for admission considered - will get labs and imaging and reassess.   Reviewed nursing  notes and prior charts for additional history. External reports reviewed.   Cardiac monitor: sinus rhythm, rate 78.  Labs reviewed/interpreted by me - trop normal. Wbc and hgb normal.   Xrays reviewed/interpreted by me - no pna.   Acetaminophen po, pepcid po, maalox po.  Recheck pt, no chest pain or discomfort. No sob.   Additional labs reviewed/interpreted by me - delta trop normal, not increased.   Pt remains symptom free.  Pt current appears stable for d/c.  Rec close cardiology f/u.  Return precautions provided.              Final Clinical Impression(s) / ED Diagnoses Final diagnoses:  None    Rx / DC Orders ED Discharge Orders     None           Cathren Laine, MD 06/30/22 1542

## 2022-06-30 NOTE — ED Triage Notes (Signed)
Patient here POV from Home.  Endorses Sudden Onset of SOB and Diaphoresis approximately 1-2 Hours ago. Associated with Lower Left CP that shoots Upwards. Intermittent in [place].   NAD Noted during Triage. A&Ox4. GCS 15. Ambulatory.

## 2022-07-01 ENCOUNTER — Ambulatory Visit (INDEPENDENT_AMBULATORY_CARE_PROVIDER_SITE_OTHER): Payer: 59

## 2022-07-01 ENCOUNTER — Ambulatory Visit: Payer: 59 | Attending: Internal Medicine | Admitting: Internal Medicine

## 2022-07-01 ENCOUNTER — Encounter: Payer: Self-pay | Admitting: Internal Medicine

## 2022-07-01 VITALS — BP 128/74 | HR 97 | Ht 66.0 in | Wt 195.4 lb

## 2022-07-01 DIAGNOSIS — R55 Syncope and collapse: Secondary | ICD-10-CM | POA: Diagnosis not present

## 2022-07-01 DIAGNOSIS — M3500 Sicca syndrome, unspecified: Secondary | ICD-10-CM

## 2022-07-01 DIAGNOSIS — G4733 Obstructive sleep apnea (adult) (pediatric): Secondary | ICD-10-CM

## 2022-07-01 DIAGNOSIS — R072 Precordial pain: Secondary | ICD-10-CM | POA: Diagnosis not present

## 2022-07-01 DIAGNOSIS — R002 Palpitations: Secondary | ICD-10-CM

## 2022-07-01 NOTE — Progress Notes (Signed)
Cardiology Office Note:    Date:  07/01/2022   ID:  [name] [name], DOB [birth year], MRN [id number]  PCP:  Glendale Chard, [place] Providers Cardiologist:  Werner Lean, MD     Referring MD: Lajean Saver, MD   CC: Chest pain Consulted for the evaluation of chest pain at the behest of Dr. Ashok Cordia  History of Present Illness:    [name] [name] is a 66 y.o. female with a hx of GERD, HLD,  hx of Sjogren's Syndrome, OSA on CPAP who presents for evaluation.  Patient notes that she is feeling [place].   Yesterday has had some sharp shooting pain that is different that her GERD. Started 5 years prior that occurred 5 years prior.  Worse with laying down at night.  Feels like a back ache that radiates to her front.   Yesterday it was worse- associated with clammy feeling.    Still has a bit of chest pain today.  Worse with laying down, better with certain positions.  Had some fluttering in her heart that has resolved symptoms  Has had some heart fluttering growing up but has improved until recently  [facility] most days and feels fine.  Saw Dr. Adrian Prows in the past ~5-6 years negative stress test normal echo 2016.   No shortness of breath, DOE .  No PND or orthopnea.  No weight gain, leg swelling , or abdominal swelling.  Is lightheaded. Notes  no palpitations or funny heart beats.      Past Medical History:  Diagnosis Date   Abnormal liver enzymes    Acid reflux    Arthritis    back and neck   Bloating    Bronchitis    Chest pain, unspecified    Diarrhea    Dyspepsia    Esophageal reflux    H/O acute pancreatitis    H/O hiatal hernia    Headache(784.0)    migraines   Hemorrhage of rectum and anus    Hypercholesteremia    Nausea    Sjogren's disease ([place])    Spinal stenosis, cervical region    Thyroid disease    Thyroid nodule     Past Surgical History:  Procedure Laterality Date   ABDOMINAL HYSTERECTOMY  2007   total with [place]   ESOPHAGOGASTRODUODENOSCOPY     EUS  12/21/2011   Procedure: ESOPHAGEAL ENDOSCOPIC ULTRASOUND (EUS) RADIAL;  Surgeon: Arta Silence, MD;  Location: [facility];  Service: Endoscopy;  Laterality: N/A;   eye cautery     EYE SURGERY     lasik   EYE SURGERY Left    cataract extraction    KNEE ARTHROSCOPY Right 06/11/2020   torn meniscus     Current Medications: Current Meds  Medication Sig   Cholecalciferol (VITAMIN D3) 250 MCG (10000 UT) capsule Take 10,000 Units by mouth daily.   Cyanocobalamin (VITAMIN B 12 PO) Take by mouth.   GINSENG CHINESE RED PO Take by mouth.   Glucosamine-Chondroitin-MSM 375-300-237.5 MG CAPS Take by mouth.   Omega-3 Fatty Acids (FISH OIL PO) Take by mouth 2 (two) times daily.   omeprazole-sodium bicarbonate (ZEGERID) 40-1100 MG capsule Take 1 capsule by mouth as needed.   Peppermint Oil (IBGARD) 90 MG CPCR    VITAMIN D PO Take 2,000 Units by mouth daily.     Allergies:   Morphine and related, Other,  and Sulfa antibiotics   Social History   Socioeconomic History   Marital status: Married    Spouse name: Not on file   Number of children: 2   Years of education: Not on file   Highest education level: Not on file  Occupational History   Not on file  Tobacco Use   Smoking status: Never    Passive exposure: Past   Smokeless tobacco: Never  Vaping Use   Vaping Use: Never used  Substance and Sexual Activity   Alcohol use: Yes    Comment: rarely    Drug use: No   Sexual activity: Yes    Birth control/protection: None  Other Topics Concern   Not on file  Social History Narrative   Married.   Drinks occasional sweet tea.   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   Social Determinants of Radio broadcast assistant Strain: Not on file  Food Insecurity: Not on file  Transportation Needs: Not on file  Physical Activity: Not on file   Stress: Not on file  Social Connections: Not on file     Family History: The patient's family history includes Colon cancer in her mother; Healthy in her daughter and son; Pancreatic cancer in her brother. Grandfather had cardiomegaly.  ROS:   Please see the history of present illness.     All other systems reviewed and are negative.  EKGs/Labs/Other Studies Reviewed:    The following studies were reviewed today:  EKG:  EKG is  ordered today.  The ekg ordered today demonstrates  SR rate 06/30/22     Recent Labs: 02/15/2022: TSH 1.200 06/02/2022: ALT 14 06/30/2022: BUN 15; Creatinine, Ser 0.84; Hemoglobin 14.0; Platelets 243; Potassium 4.2; Sodium 141  Recent Lipid Panel    Component Value Date/Time   CHOL 229 (H) 05/21/2021 0825   TRIG 64 05/21/2021 0825   HDL 72 05/21/2021 0825   CHOLHDL 3.2 05/21/2021 0825   VLDL 10 10/25/2011 0330   LDLCALC 141 (H) 05/21/2021 0825       Physical Exam:    VS:  BP 128/74   Pulse 97   Ht _0  (1.676 m)   Wt 195 lb 6.4 oz (88.6 kg)   SpO2 98%   BMI 31.54 kg/m     Wt Readings from Last 3 Encounters:  07/01/22 195 lb 6.4 oz (88.6 kg)  06/30/22 192 lb 0.3 oz (87.1 kg)  06/24/22 192 lb (87.1 kg)    GEN:  Well nourished, well developed in no acute distress HEENT: Normal NECK: No JVD; No carotid bruits LYMPHATICS: No lymphadenopathy CARDIAC: RRR, subtle soft murmur no rubs, gallops, subtle RV impulse RESPIRATORY:  Clear to auscultation without rales, wheezing or rhonchi  ABDOMEN: Soft, non-tender, non-distended MUSCULOSKELETAL:  No edema; No deformity  SKIN: Warm and dry NEUROLOGIC:  Alert and oriented x 3 PSYCHIATRIC:  Normal affect   ASSESSMENT:    1. Near syncope   2. Palpitations   3. Sjogren's syndrome, with unspecified organ involvement ([place])   4. Precordial chest pain   5. Obstructive sleep apnea on CPAP   6. OSA on CPAP    PLAN:    Positional CP GERD - ESR and CRP; if all work up is negative and persistent  pain consider CCTA  Near syncope and palpitations OSA on CPAP - 7 day non live ziopatch, concerned for SVT  Sjogren's syndrome Heart murmur - echo  HLD - recheck at next visit  If all  the above is negative likely non cardiac  4 months me or APP        Medication Adjustments/Labs and Tests Ordered: Current medicines are reviewed at length with the patient today.  Concerns regarding medicines are outlined above.  Orders Placed This Encounter  Procedures   High sensitivity CRP   Sedimentation rate   LONG TERM MONITOR (3-14 DAYS)   ECHOCARDIOGRAM COMPLETE   No orders of the defined types were placed in this encounter.   Patient Instructions  Medication Instructions:  Your physician recommends that you continue on your current medications as directed. Please refer to the Current Medication list given to you today.  *If you need a refill on your cardiac medications before your next appointment, please call your pharmacy*   Lab Work: TODAY: ESR, CRP  If you have labs (blood work) drawn today and your tests are completely normal, you will receive your results only by: [place] (if you have MyChart) OR A paper copy in the mail If you have any lab test that is abnormal or we need to change your treatment, we will call you to review the results.   Testing/Procedures: Your physician has requested that you have an echocardiogram. Echocardiography is a painless test that uses sound waves to create images of your heart. It provides your doctor with information about the size and shape of your heart and how well your heart's chambers and valves are working. This procedure takes approximately one hour. There are no restrictions for this procedure. Please do NOT wear cologne, perfume, aftershave, or lotions (deodorant is allowed). Please arrive 15 minutes prior to your appointment time.  Your physician has requested that you wear a heart monitor.    Follow-Up: At [facility], you and your health needs are our priority.  As part of our continuing mission to provide you with exceptional heart care, we have created designated Provider Care Teams.  These Care Teams include your primary Cardiologist (physician) and Advanced Practice Providers (APPs -  Physician Assistants and Nurse Practitioners) who all work together to provide you with the care you need, when you need it.    Your next appointment:   4 month(s)  The format for your next appointment:   In Person  Provider:   Werner Lean, MD     Other Instructions [name]- Long Term Monitor Instructions  Your physician has requested you wear a ZIO patch monitor for 7 days.  This is a single patch monitor. [employer] supplies one patch monitor per enrollment. Additional stickers are not available. Please do not apply patch if you will be having a Nuclear Stress Test,  Cardiac CT, MRI, or Chest Xray during the period you would be wearing the  monitor. The patch cannot be worn during these tests. You cannot remove and re-apply the  ZIO XT patch monitor.  Your ZIO patch monitor will be mailed 3 day USPS to your address on file. It may take 3-5 days  to receive your monitor after you have been enrolled.  Once you have received your monitor, please review the enclosed instructions. Your monitor  has already been registered assigning a specific monitor serial # to you.  Billing and Patient Assistance Program Information  We have supplied [employer] with any of your insurance information on file for billing purposes. [employer] offers a sliding scale Patient Assistance Program for patients that do not have  insurance, or whose insurance does not completely cover the cost of the  ZIO monitor.  You must apply for the Patient Assistance Program to qualify for this discounted rate.  To apply, please call [employer] at [phone], select option 4, select option 2, ask to apply for  Patient Assistance  Program. [name] will ask your household income, and how many people  are in your household. They will quote your out-of-pocket cost based on that information.  [employer] will also be able to set up a 16-month interest-free payment plan if needed.  Applying the monitor   Shave hair from upper left chest.  Hold abrader disc by orange tab. Rub abrader in 40 strokes over the upper left chest as  indicated in your monitor instructions.  Clean area with 4 enclosed alcohol pads. Let dry.  Apply patch as indicated in monitor instructions. Patch will be placed under collarbone on left  side of chest with arrow pointing upward.  Rub patch adhesive wings for 2 minutes. Remove white label marked "1". Remove the white  label marked "2". Rub patch adhesive wings for 2 additional minutes.  While looking in a mirror, press and release button in center of patch. A small green light will  flash 3-4 times. This will be your only indicator that the monitor has been turned on.  Do not shower for the first 24 hours. You may shower after the first 24 hours.  Press the button if you feel a symptom. You will hear a small click. Record Date, Time and  Symptom in the Patient Logbook.  When you are ready to remove the patch, follow instructions on the last 2 pages of Patient  Logbook. Stick patch monitor onto the last page of Patient Logbook.  Place Patient Logbook in the blue and white box. Use locking tab on box and tape box closed  securely. The blue and white box has prepaid postage on it. Please place it in the mailbox as  soon as possible. Your physician should have your test results approximately 7 days after the  [facility]  Call IM[place]at [phone] you have questions regarding  your ZIO XT patch monitor. Call them immediately if you see an orange light blinking on your  monitor.  If your monitor falls off in less than 4 days, contact our  Monitor department at [phone]  If your monitor becomes loose or falls off after 4 days call [employer] at [phone]  suggestions on securing your monitor   Important Information About Sugar         Signed, MWerner Lean MD  07/01/2022 2:21 PM    CSan Juan

## 2022-07-01 NOTE — Progress Notes (Unsigned)
Enrolled patient for a 7 day Zio XT monitor to be mailed to patients home.  

## 2022-07-01 NOTE — Patient Instructions (Signed)
Medication Instructions:  Your physician recommends that you continue on your current medications as directed. Please refer to the Current Medication list given to you today.  *If you need a refill on your cardiac medications before your next appointment, please call your pharmacy*   Lab Work: TODAY: ESR, CRP  If you have labs (blood work) drawn today and your tests are completely normal, you will receive your results only by: [name] [name] (if you have MyChart) OR A paper copy in the mail If you have any lab test that is abnormal or we need to change your treatment, we will call you to review the results.   Testing/Procedures: Your physician has requested that you have an echocardiogram. Echocardiography is a painless test that uses sound waves to create images of your heart. It provides your doctor with information about the size and shape of your heart and how well your heart's chambers and valves are working. This procedure takes approximately one hour. There are no restrictions for this procedure. Please do NOT wear cologne, perfume, aftershave, or lotions (deodorant is allowed). Please arrive 15 minutes prior to your appointment time.  Your physician has requested that you wear a heart monitor.    Follow-Up: At [facility], you and your health needs are our priority.  As part of our continuing mission to provide you with exceptional heart care, we have created designated Provider Care Teams.  These Care Teams include your primary Cardiologist (physician) and Advanced Practice Providers (APPs -  Physician Assistants and Nurse Practitioners) who all work together to provide you with the care you need, when you need it.    Your next appointment:   4 month(s)  The format for your next appointment:   In Person  Provider:   Werner Lean, MD     Other Instructions [name]- Long Term Monitor Instructions  Your physician has requested you wear a ZIO patch  monitor for 7 days.  This is a single patch monitor. [employer] supplies one patch monitor per enrollment. Additional stickers are not available. Please do not apply patch if you will be having a Nuclear Stress Test,  Cardiac CT, MRI, or Chest Xray during the period you would be wearing the  monitor. The patch cannot be worn during these tests. You cannot remove and re-apply the  ZIO XT patch monitor.  Your ZIO patch monitor will be mailed 3 day USPS to your address on file. It may take 3-5 days  to receive your monitor after you have been enrolled.  Once you have received your monitor, please review the enclosed instructions. Your monitor  has already been registered assigning a specific monitor serial # to you.  Billing and Patient Assistance Program Information  We have supplied [employer] with any of your insurance information on file for billing purposes. [employer] offers a sliding scale Patient Assistance Program for patients that do not have  insurance, or whose insurance does not completely cover the cost of the ZIO monitor.  You must apply for the Patient Assistance Program to qualify for this discounted rate.  To apply, please call [employer] at [phone], select option 4, select option 2, ask to apply for  Patient Assistance Program. [name] will ask your household income, and how many people  are in your household. They will quote your out-of-pocket cost based on that information.  [employer] will also be able to set up a 50-month interest-free payment plan if needed.  Applying the monitor   Shave  hair from upper left chest.  Hold abrader disc by orange tab. Rub abrader in 40 strokes over the upper left chest as  indicated in your monitor instructions.  Clean area with 4 enclosed alcohol pads. Let dry.  Apply patch as indicated in monitor instructions. Patch will be placed under collarbone on left  side of chest with arrow pointing upward.  Rub patch adhesive wings for 2 minutes.  Remove white label marked "1". Remove the white  label marked "2". Rub patch adhesive wings for 2 additional minutes.  While looking in a mirror, press and release button in center of patch. A small green light will  flash 3-4 times. This will be your only indicator that the monitor has been turned on.  Do not shower for the first 24 hours. You may shower after the first 24 hours.  Press the button if you feel a symptom. You will hear a small click. Record Date, Time and  Symptom in the Patient Logbook.  When you are ready to remove the patch, follow instructions on the last 2 pages of Patient  Logbook. Stick patch monitor onto the last page of Patient Logbook.  Place Patient Logbook in the blue and white box. Use locking tab on box and tape box closed  securely. The blue and white box has prepaid postage on it. Please place it in the mailbox as  soon as possible. Your physician should have your test results approximately 7 days after the  monitor has been mailed back to [facility].  Call [facility] at [phone] if you have questions regarding  your ZIO XT patch monitor. Call them immediately if you see an orange light blinking on your  monitor.  If your monitor falls off in less than 4 days, contact our Monitor department at [phone].  If your monitor becomes loose or falls off after 4 days call [employer] at [phone] for  suggestions on securing your monitor   Important Information About Sugar

## 2022-07-02 LAB — HIGH SENSITIVITY CRP: CRP, High Sensitivity: 2.41 mg/L (ref 0.00–3.00)

## 2022-07-02 LAB — SEDIMENTATION RATE: Sed Rate: 8 mm/hr (ref 0–40)

## 2022-07-13 ENCOUNTER — Ambulatory Visit (HOSPITAL_COMMUNITY): Payer: 59 | Attending: Internal Medicine

## 2022-07-13 DIAGNOSIS — M3500 Sicca syndrome, unspecified: Secondary | ICD-10-CM

## 2022-07-13 DIAGNOSIS — R55 Syncope and collapse: Secondary | ICD-10-CM

## 2022-07-13 DIAGNOSIS — R002 Palpitations: Secondary | ICD-10-CM | POA: Diagnosis not present

## 2022-07-13 LAB — ECHOCARDIOGRAM COMPLETE
Area-P 1/2: 4.15 cm2
S' Lateral: 2.7 cm

## 2022-07-14 DIAGNOSIS — R002 Palpitations: Secondary | ICD-10-CM

## 2022-07-14 DIAGNOSIS — R55 Syncope and collapse: Secondary | ICD-10-CM | POA: Diagnosis not present

## 2022-08-03 ENCOUNTER — Other Ambulatory Visit: Payer: Self-pay | Admitting: Internal Medicine

## 2022-08-03 DIAGNOSIS — E041 Nontoxic single thyroid nodule: Secondary | ICD-10-CM

## 2022-08-08 ENCOUNTER — Ambulatory Visit: Payer: 59 | Admitting: Internal Medicine

## 2022-08-10 ENCOUNTER — Telehealth: Payer: Self-pay

## 2022-08-10 DIAGNOSIS — R072 Precordial pain: Secondary | ICD-10-CM

## 2022-08-10 MED ORDER — METOPROLOL TARTRATE 100 MG PO TABS: ORAL_TABLET | ORAL | 0 refills | Status: DC

## 2022-08-10 NOTE — Telephone Encounter (Signed)
-----   Message from Christell Constant, MD sent at 08/04/2022  4:22 PM EST ----- Will plan for CCTA as per plan in prior note. ----- Message ----- From: Macie Burows, RN Sent: 08/04/2022   3:49 PM EST To: Christell Constant, MD  The patient has been notified of the result and verbalized understanding.  All questions (if any) were answered. Arvid Right Aftan Vint, RN 08/04/2022 3:48 PM   Pt reports still has occasional CP that feels like gas.  Advised will send to MD to see if would like to order Cardiac CT.

## 2022-08-10 NOTE — Telephone Encounter (Signed)
Called pt advised of MD recommendation for a Cardiac CT educated pt on reason for testing.  Pt is agreeable to plan. Reviewed instructions for Cardiac CT. Will place on my chart for pt to review.  Advised pt to call in or send a my chart message with questions or concerns.

## 2022-08-17 ENCOUNTER — Telehealth (HOSPITAL_COMMUNITY): Payer: Self-pay | Admitting: *Deleted

## 2022-08-17 NOTE — Telephone Encounter (Signed)
Patient calling about her upcoming cardiac imaging study; pt verbalizes understanding of appt date/time, parking situation and where to check in, pre-test NPO status and medications ordered, and verified current allergies; name and call back number provided for further questions should they arise  Larey Brick RN Navigator Cardiac Imaging [facility] [phone] office [phone] cell  Patient to take 100mg  metoprolol tartrate two hours prior to her cardiac CT scan.  She is aware to arrive at 11:30am.

## 2022-08-18 ENCOUNTER — Ambulatory Visit (HOSPITAL_COMMUNITY)
Admission: RE | Admit: 2022-08-18 | Discharge: 2022-08-18 | Disposition: A | Discharge status: Home / Self Care | Payer: Medicare HMO | Source: Ambulatory Visit | Attending: Internal Medicine | Admitting: Internal Medicine

## 2022-08-18 DIAGNOSIS — R072 Precordial pain: Secondary | ICD-10-CM | POA: Insufficient documentation

## 2022-08-18 MED ORDER — NITROGLYCERIN 0.4 MG SL SUBL
SUBLINGUAL_TABLET | SUBLINGUAL | Status: AC
  Filled 2022-08-18: qty 2

## 2022-08-18 MED ORDER — NITROGLYCERIN 0.4 MG SL SUBL
0.8000 mg | SUBLINGUAL_TABLET | Freq: Once | SUBLINGUAL | Status: AC
  Administered 2022-08-18: 0.8 mg via SUBLINGUAL

## 2022-08-18 MED ORDER — IOHEXOL 350 MG/ML SOLN
100.0000 mL | Freq: Once | INTRAVENOUS | Status: AC | PRN
  Administered 2022-08-18: 100 mL via INTRAVENOUS

## 2022-08-19 ENCOUNTER — Encounter: Payer: Self-pay | Admitting: Internal Medicine

## 2022-08-24 ENCOUNTER — Other Ambulatory Visit: Payer: 59

## 2022-09-22 ENCOUNTER — Inpatient Hospital Stay: Admission: RE | Admit: 2022-09-22 | Payer: Medicare HMO | Source: Ambulatory Visit

## 2022-10-13 NOTE — Progress Notes (Unsigned)
Office Visit Note  Patient: [name] [name]             Date of Birth: Oct 03, 1955           MRN: [id number]             PCP: Glendale Chard, MD Referring: Glendale Chard, MD Visit Date: 10/26/2022 Occupation: @GUAROCC$ @  Subjective:  Arthralgias   History of Present Illness: [name] [name] is a 67 y.o. female with history of sjogren's syndrome and osteoarthritis.  She is not currently taking any immunosuppressive agents.  Patient continues to have symptoms of dry eyes.  She has tried over-the-counter eyedrops with minimal to no improvement.  She continues to follow-up with her ophthalmologist on a regular basis.  She has not been experiencing any mouth dryness recently.  She denies any sores in her mouth or nose.  She denies any parotid swelling or tenderness.  Patient reports that she is currently scheduled for thyroid biopsy.  She has been undergoing physical therapy for neck pain and stiffness and has started to notice some improvement in her symptoms.  She states she has been experiencing difficulty falling asleep at night due to aching in her shoulders and hips.  She has a good range of motion in both shoulder joints.  She states that she experiences intermittent aching in both hands but denies any joint swelling. She denies any shortness of breath, pleuritic chest pain, or coughing.    Activities of Daily Living:  Patient reports morning stiffness for 5 minutes.   Patient Reports nocturnal pain.  Difficulty dressing/grooming: Denies Difficulty climbing stairs: Denies Difficulty getting out of chair: Denies Difficulty using hands for taps, buttons, cutlery, and/or writing: Reports  Review of Systems  Constitutional: Negative.  Negative for fatigue.  HENT: Negative.  Negative for mouth sores and mouth dryness.   Eyes:  Positive for dryness.  Respiratory:  Positive for shortness of breath.   Cardiovascular: Negative.  Negative for chest pain and palpitations.  Gastrointestinal:   Positive for blood in stool. Negative for constipation and diarrhea.  Endocrine: Negative.  Negative for increased urination.  Genitourinary: Negative.  Negative for involuntary urination.  Musculoskeletal:  Positive for joint pain, gait problem, joint pain, muscle weakness and morning stiffness. Negative for joint swelling, myalgias, muscle tenderness and myalgias.  Skin: Negative.  Negative for color change, rash, hair loss and sensitivity to sunlight.  Allergic/Immunologic: Negative.  Negative for susceptible to infections.  Neurological:  Positive for headaches. Negative for dizziness.  Hematological:  Positive for swollen glands.  Psychiatric/Behavioral:  Positive for sleep disturbance. Negative for depressed mood. The patient is not nervous/anxious.     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/13/2022   Primary osteoarthritis of both knees 07/30/2018   Primary osteoarthritis of both hands 07/30/2018   Foot pain 05/15/2018   OSA on CPAP 02/23/2017   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   Snoring 03/25/2015   Hypersomnia with sleep apnea 03/25/2015   Multinodular goiter (nontoxic) 02/08/2012   Acute pancreatitis 10/26/2011   Nausea and  vomiting in adult 10/26/2011   Dehydration 10/26/2011   Gastroenteritis 10/26/2011   Hypokalemia 10/26/2011   Elevated liver function tests 10/26/2011   Acid reflux    Thyroid disease     Past Medical History:  Diagnosis Date   Abnormal liver enzymes    Acid reflux    Arthritis    back and neck   Bloating     Bronchitis    Chest pain, unspecified    Diarrhea    Dyspepsia    Esophageal reflux    H/O acute pancreatitis    H/O hiatal hernia    Headache(784.0)    migraines   Hemorrhage of rectum and anus    Hypercholesteremia    Nausea    Sjogren's disease (H[facility])    Spinal stenosis, cervical region    Thyroid disease    Thyroid nodule     Family History  Problem Relation Age of Onset   Colon cancer Mother    Pancreatic cancer Brother    Healthy Son    Healthy Daughter    Past Surgical History:  Procedure Laterality Date   ABDOMINAL HYSTERECTOMY  2007   total with [place]   ESOPHAGOGASTRODUODENOSCOPY     EUS  12/21/2011   Procedure: ESOPHAGEAL ENDOSCOPIC ULTRASOUND (EUS) RADIAL;  Surgeon: Arta Silence, MD;  Location: [facility];  Service: Endoscopy;  Laterality: N/A;   eye cautery     EYE SURGERY     lasik   EYE SURGERY Left    cataract extraction    KNEE ARTHROSCOPY Right 06/11/2020   torn meniscus    Social History   Social History Narrative   Married.   Drinks occasional sweet tea.   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   Immunization History  Administered Date(s) Administered   Influenza,inj,Quad PF,6+ Mos 06/24/2022   PFIZER(Purple Top)SARS-COV-2 Vaccination 11/22/2019, 12/13/2019, 07/01/2020, 03/26/2021   Tdap 07/13/2017   Zoster Recombinat (Shingrix) 02/15/2022, 05/10/2022     Objective: Vital Signs: BP 136/85 (BP Location: Left Arm, Patient Position: Sitting, Cuff Size: Large)   Pulse 69   Resp 16   Ht 5' 6"$  (1.676 m)   Wt 196 lb 6.4 oz (89.1 kg)   BMI 31.70 kg/m    Physical Exam Vitals and nursing note reviewed.  Constitutional:      Appearance: She is well-developed.  HENT:     Head: Normocephalic and atraumatic.  Eyes:     Conjunctiva/sclera: Conjunctivae normal.  Cardiovascular:     Rate and Rhythm: Normal rate and regular rhythm.      Heart sounds: Normal heart sounds.  Pulmonary:     Effort: Pulmonary effort is normal.     Breath sounds: Normal breath sounds.  Abdominal:     General: Bowel sounds are normal.     Palpations: Abdomen is soft.  Musculoskeletal:     Cervical back: Normal range of motion.  Lymphadenopathy:     Cervical: No cervical adenopathy.  Skin:    General: Skin is warm and dry.     Capillary Refill: Capillary refill takes less than 2 seconds.  Neurological:     Mental Status: She is alert and oriented to person, place, and time.  Psychiatric:        Behavior: Behavior normal.      Musculoskeletal Exam: C-spine has limited range of motion with lateral rotation.  Thoracic and lumbar spine  have good range of motion.  No midline spinal tenderness.  Some trapezius muscle tension and tenderness bilaterally.  Shoulder joints have good range of motion with no tenderness or effusion.  Some tenderness at the deltoid insertion site bilaterally.  Elbow joints, wrist joints, MCPs, PIPs, DIPs have good range of motion with no synovitis.  PIP and DIP thickening consistent with osteoarthritis of both hands.  Hip joints have good range of motion with no groin pain.  Some tenderness over the trochanteric bursa bilaterally.  Knee joints have good range of motion with some discomfort in the right knee.  Ankle joints have good range of motion with no tenderness or joint swelling.  CDAI Exam: CDAI Score: -- Patient Global: --; Provider Global: -- Swollen: --; Tender: -- Joint Exam 10/26/2022   No joint exam has been documented for this visit   There is currently no information documented on the homunculus. Go to the Rheumatology activity and complete the homunculus joint exam.  Investigation: No additional findings.  Imaging: No results found.  Recent Labs: Lab Results  Component Value Date   WBC 6.9 06/30/2022   HGB 14.0 06/30/2022   PLT 243 06/30/2022   NA 141 06/30/2022   K 4.2 06/30/2022   CL  105 06/30/2022   CO2 24 06/30/2022   GLUCOSE 105 (H) 06/30/2022   BUN 15 06/30/2022   CREATININE 0.84 06/30/2022   BILITOT 0.9 06/02/2022   ALKPHOS 64 01/09/2021   AST 15 06/02/2022   ALT 14 06/02/2022   PROT 7.2 06/02/2022   PROT 6.8 06/02/2022   ALBUMIN 3.6 01/09/2021   CALCIUM 10.0 06/30/2022   GFRAA 88 09/29/2020    Speciality Comments: PLQ eye exam: 08/27/2019 normal. [facility]. Follow up in 6 months. PLQ started 07/2019  Procedures:  No procedures performed Allergies: Morphine and related, Other, and Sulfa antibiotics   Assessment / Plan:     Visit Diagnoses: Sjogren's syndrome with other organ involvement ([place]) - ANA 1: 80 speckled, positive Ro antibody, history of dry eyes and dry skin: Patient continues to experience symptoms of dry eyes.  She has tried several over-the-counter eyedrops with minimal to no improvement.  She follows up with ophthalmology on a regular basis.  She has not had any symptoms of mouth dryness.  No need for pilocarpine at this time.  She has not had any shortness of breath, cough, pleuritic chest pain.  Her lungs were clear to auscultation today.  Discussed the increased risk for developing ILD in patients with Sjogren's syndrome.  I also discussed the 4-14 fold increased risk for developing lymphoma in patients with Sjogren's syndrome.  SPEP was within normal limits on 06/02/2022. She has no signs of inflammatory arthritis at this time.  No synovitis noted today.  She was advised to notify us if she develops increased joint pain or joint swelling.  Lab work from 9/28/2/3 was reviewed today in the office: ANA 1: 80 NS, Ro>8, RF-, SPEP did not reveal any abnormal protein bands, ESR WNL, complements WNL, UA normal, CBC and CMP WNL.  ESR and CRP WNL on 07/01/22.  Results were reviewed with the patient today in the office.  Future orders for the following lab work was placed today the patient plans on returning when the lab tech has returned to the  office. She does not require immunosuppressive therapy at this time.  She was advised to notify us if she develops any new or worsening symptoms.  She will follow-up in the office in  6 months or sooner if needed.  - Plan: Urinalysis, Routine w reflex microscopic, COMPLETE METABOLIC PANEL WITH GFR, CBC with Differential/Platelet, C3 and C4, Sedimentation rate, Rheumatoid factor, Serum protein electrophoresis with reflex, ANA  High risk medication use - D/c Plaquenil 200 mg p.o. twice daily for about 3 months and then discontinued due to not noticed any clinical benefit.  No need for immunosuppressive therapy at this time.  - Plan: COMPLETE METABOLIC PANEL WITH GFR, CBC with Differential/Platelet  Antineutrophil cytoplasmic antibody (ANCA) positive: No clinical features of vasculitis. Sed rate has been normal.  Sed rate was 8 and CRP with was within normal limits on 07/01/2022.   Primary osteoarthritis of both hands: She has PIP and DIP thickening consistent with osteoarthritis of both hands.  No tenderness or synovitis noted.  Effusion, right knee: No recurrence.  Primary osteoarthritis of both knees - Dr. Tera Helper has good range of motion of both knee joints on examination today.  No warmth or effusion noted.  Other medical conditions are listed as follows:   History of sleep apnea: Uses CPAP  History of thyroid disease: Scheduled for ultrasound-guided fine-needle biopsy.   Other fatigue: Stable.   Orders: Orders Placed This Encounter  Procedures   Urinalysis, Routine w reflex microscopic   COMPLETE METABOLIC PANEL WITH GFR   CBC with Differential/Platelet   C3 and C4   Sedimentation rate   Rheumatoid factor   Serum protein electrophoresis with reflex   ANA   No orders of the defined types were placed in this encounter.    Follow-Up Instructions: Return in about 6 months (around 04/26/2023) for Sjogren's syndrome.   Ofilia Neas, PA-C  Note - This record has been created  using Dragon software.  Chart creation errors have been sought, but may not always  have been located. Such creation errors do not reflect on  the standard of medical care.

## 2022-10-19 DIAGNOSIS — M542 Cervicalgia: Secondary | ICD-10-CM | POA: Diagnosis not present

## 2022-10-19 DIAGNOSIS — Z6831 Body mass index (BMI) 31.0-31.9, adult: Secondary | ICD-10-CM | POA: Diagnosis not present

## 2022-10-19 DIAGNOSIS — M5030 Other cervical disc degeneration, unspecified cervical region: Secondary | ICD-10-CM | POA: Diagnosis not present

## 2022-10-20 DIAGNOSIS — M542 Cervicalgia: Secondary | ICD-10-CM | POA: Diagnosis not present

## 2022-10-26 ENCOUNTER — Encounter: Payer: Self-pay | Admitting: Physician Assistant

## 2022-10-26 ENCOUNTER — Ambulatory Visit: Payer: Medicare HMO | Attending: Rheumatology | Admitting: Physician Assistant

## 2022-10-26 VITALS — BP 136/85 | HR 69 | Resp 16 | Ht 66.0 in | Wt 196.4 lb

## 2022-10-26 DIAGNOSIS — M542 Cervicalgia: Secondary | ICD-10-CM | POA: Diagnosis not present

## 2022-10-26 DIAGNOSIS — M19042 Primary osteoarthritis, left hand: Secondary | ICD-10-CM

## 2022-10-26 DIAGNOSIS — M3509 Sicca syndrome with other organ involvement: Secondary | ICD-10-CM | POA: Diagnosis not present

## 2022-10-26 DIAGNOSIS — R768 Other specified abnormal immunological findings in serum: Secondary | ICD-10-CM

## 2022-10-26 DIAGNOSIS — Z8639 Personal history of other endocrine, nutritional and metabolic disease: Secondary | ICD-10-CM | POA: Diagnosis not present

## 2022-10-26 DIAGNOSIS — M25461 Effusion, right knee: Secondary | ICD-10-CM | POA: Diagnosis not present

## 2022-10-26 DIAGNOSIS — R5383 Other fatigue: Secondary | ICD-10-CM | POA: Diagnosis not present

## 2022-10-26 DIAGNOSIS — M19041 Primary osteoarthritis, right hand: Secondary | ICD-10-CM | POA: Diagnosis not present

## 2022-10-26 DIAGNOSIS — M170 Bilateral primary osteoarthritis of knee: Secondary | ICD-10-CM | POA: Diagnosis not present

## 2022-10-26 DIAGNOSIS — Z8669 Personal history of other diseases of the nervous system and sense organs: Secondary | ICD-10-CM

## 2022-10-26 DIAGNOSIS — Z79899 Other long term (current) drug therapy: Secondary | ICD-10-CM | POA: Diagnosis not present

## 2022-10-26 NOTE — Patient Instructions (Signed)
Standing Labs We placed an order today for your standing lab work.   Please have your standing labs drawn in February   Please have your labs drawn 2 weeks prior to your appointment so that the provider can discuss your lab results at your appointment.  Please note that you may see your imaging and lab results in [place] before we have reviewed them. We will contact you once all results are reviewed. Please allow our office up to 72 hours to thoroughly review all of the results before contacting the office for clarification of your results.  Lab hours are:   Monday through Thursday from 8:00 am -12:30 pm and 1:00 pm-5:00 pm and Friday from 8:00 am-12:00 pm.  Please be advised, all patients with office appointments requiring lab work will take precedent over walk-in lab work.   Labs are drawn by [facility]. Please bring your co-pay at the time of your lab draw.  You may receive a bill from [facility] for your lab work.  Please note if you are on Hydroxychloroquine and and an order has been placed for a Hydroxychloroquine level, you will need to have it drawn 4 hours or more after your last dose.  If you wish to have your labs drawn at another location, please call the office 24 hours in advance so we can fax the orders.  The office is located at [address] [place], [place], [place] [address] No appointment is necessary.    If you have any questions regarding directions or hours of operation,  please call [phone].   As a reminder, please drink plenty of water prior to coming for your lab work. Thanks! =

## 2022-10-27 ENCOUNTER — Other Ambulatory Visit: Payer: Self-pay | Admitting: *Deleted

## 2022-10-27 DIAGNOSIS — Z79899 Other long term (current) drug therapy: Secondary | ICD-10-CM | POA: Diagnosis not present

## 2022-10-27 DIAGNOSIS — M542 Cervicalgia: Secondary | ICD-10-CM | POA: Diagnosis not present

## 2022-10-27 DIAGNOSIS — M3509 Sicca syndrome with other organ involvement: Secondary | ICD-10-CM | POA: Diagnosis not present

## 2022-10-28 NOTE — Progress Notes (Signed)
CBC and CMP WNL.  ESR WNL. RF negative.  Complements WNL

## 2022-10-31 NOTE — Progress Notes (Signed)
ANA remains positive but is a low titer-stable.

## 2022-11-02 ENCOUNTER — Other Ambulatory Visit: Payer: Medicare HMO

## 2022-11-02 DIAGNOSIS — M542 Cervicalgia: Secondary | ICD-10-CM | POA: Diagnosis not present

## 2022-11-02 LAB — COMPLETE METABOLIC PANEL WITH GFR
AG Ratio: 1.8 (calc) (ref 1.0–2.5)
ALT: 11 U/L (ref 6–29)
AST: 14 U/L (ref 10–35)
Albumin: 4.6 g/dL (ref 3.6–5.1)
Alkaline phosphatase (APISO): 86 U/L (ref 37–153)
BUN: 12 mg/dL (ref 7–25)
CO2: 26 mmol/L (ref 20–32)
Calcium: 9.9 mg/dL (ref 8.6–10.4)
Chloride: 105 mmol/L (ref 98–110)
Creat: 0.8 mg/dL (ref 0.50–1.05)
Globulin: 2.6 g/dL (calc) (ref 1.9–3.7)
Glucose, Bld: 86 mg/dL (ref 65–99)
Potassium: 4.6 mmol/L (ref 3.5–5.3)
Sodium: 141 mmol/L (ref 135–146)
Total Bilirubin: 0.8 mg/dL (ref 0.2–1.2)
Total Protein: 7.2 g/dL (ref 6.1–8.1)
eGFR: 82 mL/min/{1.73_m2} (ref >=60)

## 2022-11-02 LAB — CBC WITH DIFFERENTIAL/PLATELET
Absolute Monocytes: 341 cells/uL (ref 200–950)
Basophils Absolute: 22 cells/uL (ref 0–200)
Basophils Relative: 0.4 %
Eosinophils Absolute: 193 cells/uL (ref 15–500)
Eosinophils Relative: 3.5 %
HCT: 42.5 % (ref 35.0–45.0)
Hemoglobin: 14.2 g/dL (ref 11.7–15.5)
Lymphs Abs: 1634 cells/uL (ref 850–3900)
MCH: 28.2 pg (ref 27.0–33.0)
MCHC: 33.4 g/dL (ref 32.0–36.0)
MCV: 84.5 fL (ref 80.0–100.0)
MPV: 10.8 fL (ref 7.5–12.5)
Monocytes Relative: 6.2 %
Neutro Abs: 3311 cells/uL (ref 1500–7800)
Neutrophils Relative %: 60.2 %
Platelets: 235 10*3/uL (ref 140–400)
RBC: 5.03 10*6/uL (ref 3.80–5.10)
RDW: 13.1 % (ref 11.0–15.0)
Total Lymphocyte: 29.7 %
WBC: 5.5 10*3/uL (ref 3.8–10.8)

## 2022-11-02 LAB — PROTEIN ELECTROPHORESIS, SERUM, WITH REFLEX
Albumin ELP: 4.5 g/dL (ref 3.8–4.8)
Alpha 1: 0.3 g/dL (ref 0.2–0.3)
Alpha 2: 0.5 g/dL (ref 0.5–0.9)
Beta 2: 0.4 g/dL (ref 0.2–0.5)
Beta Globulin: 0.4 g/dL (ref 0.4–0.6)
Gamma Globulin: 0.8 g/dL (ref 0.8–1.7)
Total Protein: 6.9 g/dL (ref 6.1–8.1)

## 2022-11-02 LAB — URINALYSIS, ROUTINE W REFLEX MICROSCOPIC
Bilirubin Urine: NEGATIVE
Glucose, UA: NEGATIVE
Hgb urine dipstick: NEGATIVE
Ketones, ur: NEGATIVE
Leukocytes,Ua: NEGATIVE
Nitrite: NEGATIVE
Protein, ur: NEGATIVE
Specific Gravity, Urine: 1.004 (ref 1.001–1.035)
pH: 6 (ref 5.0–8.0)

## 2022-11-02 LAB — ANTI-NUCLEAR AB-TITER (ANA TITER): ANA Titer 1: 1:80 {titer} — ABNORMAL HIGH

## 2022-11-02 LAB — ANA: Anti Nuclear Antibody (ANA): POSITIVE — AB

## 2022-11-02 LAB — C3 AND C4
C3 Complement: 113 mg/dL (ref 83–193)
C4 Complement: 44 mg/dL (ref 15–57)

## 2022-11-02 LAB — SEDIMENTATION RATE: Sed Rate: 6 mm/h (ref 0–30)

## 2022-11-02 LAB — RHEUMATOID FACTOR: Rheumatoid fact SerPl-aCnc: <14 IU/mL (ref <14)

## 2022-11-02 LAB — IFE INTERPRETATION

## 2022-11-02 NOTE — Progress Notes (Signed)
Cardiology Office Note:    Date:  11/03/2022   ID:  [name] [name], DOB [birth year], MRN [id number]  PCP:  Glendale Chard, MD   [facility] Cardiologist:  Werner Lean, MD     Referring MD: Glendale Chard, MD   CC: Chest pain follow up  History of Present Illness:    [name] [name] is a 67 y.o. female with a hx of GERD, HLD,  hx of Sjogren's Syndrome, OSA on CPAP who presents for evaluation. 2023: had minimal CAC on CT. Norml echo  Patient notes that she is doing well.   Since last visit notes that she is getting back to exercising and dietary changes. There are no interval hospital/ED visit.    No chest pain or pressure.  No SOB/DOE and no PND/Orthopnea.  No weight gain or leg swelling.  No palpitations or syncope .   Past Medical History:  Diagnosis Date   Abnormal liver enzymes    Acid reflux    Arthritis    back and neck   Bloating    Bronchitis    Chest pain, unspecified    Diarrhea    Dyspepsia    Esophageal reflux    H/O acute pancreatitis    H/O hiatal hernia    Headache(784.0)    migraines   Hemorrhage of rectum and anus    Hypercholesteremia    Nausea    Sjogren's disease ([place])    Spinal stenosis, cervical region    Thyroid disease    Thyroid nodule     Past Surgical History:  Procedure Laterality Date   ABDOMINAL HYSTERECTOMY  2007   total with [place]   ESOPHAGOGASTRODUODENOSCOPY     EUS  12/21/2011   Procedure: ESOPHAGEAL ENDOSCOPIC ULTRASOUND (EUS) RADIAL;  Surgeon: Arta Silence, MD;  Location: [facility];  Service: Endoscopy;  Laterality: N/A;   eye cautery     EYE SURGERY     lasik   EYE SURGERY Left    cataract extraction    KNEE ARTHROSCOPY Right 06/11/2020   torn meniscus     Current Medications: Current Meds  Medication Sig   Apoaequorin (PREVAGEN) 10 MG CAPS Take by mouth daily at 6 (six) AM.   Ascorbic Acid (VITAMIN  C) POWD as needed (immune support).   Cholecalciferol (VITAMIN D3) 250 MCG (10000 UT) capsule Take 10,000 Units by mouth daily.   Cyanocobalamin (VITAMIN B 12 PO) Take by mouth.   GINSENG CHINESE RED PO Take by mouth.   Glucosamine-Chondroitin-MSM 375-300-237.5 MG CAPS Take by mouth.   Omega-3 Fatty Acids (FISH OIL PO) Take by mouth 2 (two) times daily.   omeprazole-sodium bicarbonate (ZEGERID) 40-1100 MG capsule Take 1 capsule by mouth as needed.     Allergies:   Morphine and related, Other, and Sulfa antibiotics   Social History   Socioeconomic History   Marital status: Married    Spouse name: Not on file   Number of children: 2   Years of education: Not on file   Highest education level: Not on file  Occupational History   Not on file  Tobacco Use   Smoking status: Never    Passive exposure: Past   Smokeless tobacco: Never  Vaping Use   Vaping Use: Never used  Substance and Sexual Activity   Alcohol use: Yes    Comment: rarely    Drug use: No  Sexual activity: Yes    Birth control/protection: None  Other Topics Concern   Not on file  Social History Narrative   Married.   Drinks occasional sweet tea.   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   Social Determinants of Radio broadcast assistant Strain: Not on file  Food Insecurity: Not on file  Transportation Needs: Not on file  Physical Activity: Not on file  Stress: Not on file  Social Connections: Not on file     Family History: The patient's family history includes Colon cancer in her mother; Healthy in her daughter and son; Pancreatic cancer in her brother. Grandfather had cardiomegaly.  ROS:   Please see the history of present illness.     All other systems reviewed and are negative.  EKGs/Labs/Other Studies Reviewed:    The following studies were reviewed today:  EKG:   06/30/2022: SR  Cardiac Studies & Procedures       ECHOCARDIOGRAM  ECHOCARDIOGRAM  COMPLETE 07/13/2022  Narrative ECHOCARDIOGRAM REPORT    Patient Name:   ZIANNE BLONDELL Date of Exam: 07/13/2022 Medical Rec #:  [id number]       Height:       66.0 in Accession #:    [id number]      Weight:       195.4 lb Date of Birth:  [birth year]      BSA:          1.980 m Patient Age:    62 years        BP:           128/74 mmHg Patient Gender: F               HR:           78 bpm. Exam Location:  [place]  Procedure: 2D Echo, 3D Echo, Cardiac Doppler, Color Doppler and Strain Analysis  Indications:    R00.2 Palpitation  History:        Patient has prior history of Echocardiogram examinations, most recent 04/15/2015. Risk Factors:Hypertension and Sleep Apnea. Sjogren's disease.  Sonographer:    Basilia Jumbo [facility], RDCS Referring Phys: [id number] [facility] A Rosaire Cueto  IMPRESSIONS   1. Left ventricular ejection fraction, by estimation, is 60 to 65%. The left ventricle has normal function. The left ventricle has no regional wall motion abnormalities. Left ventricular diastolic parameters were normal. The average left ventricular global longitudinal strain is -27.0 %. The global longitudinal strain is normal. 2. Right ventricular systolic function is normal. The right ventricular size is normal. There is normal pulmonary artery systolic pressure. 3. The mitral valve is normal in structure. Trivial mitral valve regurgitation. No evidence of mitral stenosis. 4. The aortic valve is tricuspid. Aortic valve regurgitation is not visualized. Aortic valve sclerosis is present, with no evidence of aortic valve stenosis. 5. The inferior vena cava is normal in size with greater than 50% respiratory variability, suggesting right atrial pressure of 3 mmHg.  Comparison(s): No prior Echocardiogram. Report only - [facility] 04/15/15 EF 57%.  FINDINGS Left Ventricle: Left ventricular ejection fraction, by estimation, is 60 to 65%. The left ventricle has normal function. The left  ventricle has no regional wall motion abnormalities. The average left ventricular global longitudinal strain is -27.0 %. The global longitudinal strain is normal. The left ventricular internal cavity size was normal in size. There is no left ventricular hypertrophy. Left ventricular diastolic parameters were normal.  Right Ventricle: The right ventricular size  is normal. Right ventricular systolic function is normal. There is normal pulmonary artery systolic pressure. The tricuspid regurgitant velocity is 2.52 m/s, and with an assumed right atrial pressure of 3 mmHg, the estimated right ventricular systolic pressure is [id number] mmHg.  Left Atrium: Left atrial size was normal in size.  Right Atrium: Right atrial size was normal in size.  Pericardium: There is no evidence of pericardial effusion.  Mitral Valve: The mitral valve is normal in structure. Trivial mitral valve regurgitation. No evidence of mitral valve stenosis.  Tricuspid Valve: The tricuspid valve is normal in structure. Tricuspid valve regurgitation is trivial. No evidence of tricuspid stenosis.  Aortic Valve: The aortic valve is tricuspid. Aortic valve regurgitation is not visualized. Aortic valve sclerosis is present, with no evidence of aortic valve stenosis.  Pulmonic Valve: The pulmonic valve was not well visualized. Pulmonic valve regurgitation is trivial. No evidence of pulmonic stenosis.  Aorta: The aortic root is normal in size and structure.  Venous: The inferior vena cava is normal in size with greater than 50% respiratory variability, suggesting right atrial pressure of 3 mmHg.  IAS/Shunts: No atrial level shunt detected by color flow Doppler.   LEFT VENTRICLE PLAX 2D LVIDd:         3.80 cm   Diastology LVIDs:         2.70 cm   LV e' medial:    11.70 cm/s LV PW:         1.00 cm   LV E/e' medial:  8.6 LV IVS:        1.10 cm   LV e' lateral:   16.00 cm/s LVOT diam:     2.70 cm   LV E/e' lateral: 6.3 LV SV:          123 LV SV Index:   62        2D Longitudinal Strain LVOT Area:     5.73 cm  2D Strain GLS (A2C):   -26.4 % 2D Strain GLS (A3C):   -29.2 % 2D Strain GLS (A4C):   -25.5 % 2D Strain GLS Avg:     -27.0 %  3D Volume EF: 3D EF:        63 % LV EDV:       114 ml LV ESV:       42 ml LV SV:        72 ml  RIGHT VENTRICLE             IVC RV Basal diam:  2.60 cm     IVC diam: 1.60 cm RV S prime:     17.40 cm/s TAPSE (M-mode): 2.5 cm RVSP:           28.4 mmHg  LEFT ATRIUM             Index        RIGHT ATRIUM           Index LA diam:        3.40 cm 1.72 cm/m   RA Pressure: 3.00 mmHg LA Vol (A2C):   42.0 ml 21.21 ml/m  RA Area:     12.60 cm LA Vol (A4C):   33.8 ml 17.07 ml/m  RA Volume:   27.30 ml  13.79 ml/m LA Biplane Vol: 38.1 ml 19.24 ml/m AORTIC VALVE LVOT Vmax:   111.00 cm/s LVOT Vmean:  74.800 cm/s LVOT VTI:    0.215 m  AORTA Ao Root diam: 3.50 cm Ao Asc diam:  2.90 cm  MITRAL VALVE                TRICUSPID VALVE TR Peak grad:   25.4 mmHg MV Decel Time: 183 msec     TR Vmax:        252.00 cm/s MV E velocity: 101.00 cm/s  Estimated RAP:  3.00 mmHg MV A velocity: 73.00 cm/s   RVSP:           28.4 mmHg MV E/A ratio:  1.38 SHUNTS Systemic VTI:  0.22 m Systemic Diam: 2.70 cm  Kirk Ruths MD Electronically signed by Kirk Ruths MD Signature Date/Time: 07/13/2022/4:29:53 PM    Final    MONITORS  LONG TERM MONITOR (3-14 DAYS) 08/04/2022  Narrative   Patient had a minimum heart rate of 54 bpm, maximum heart rate of 154 bpm, and average heart rate of 79 bpm.   Predominant underlying rhythm was sinus rhythm.   One 4 beat run of NSVT. One 8 beat run of regular SVT.   Isolated PACs were rare (<1.0%).   Isolated PVCs were rare (<1.0%).   Triggered and diary events associated with sinus rhythm, sinus tachycardia, and rarely PVCs.  No malignant arrhythmias.   CT SCANS  CT CORONARY MORPH W/CTA COR W/SCORE 08/20/2022  Addendum 08/20/2022  2:49 PM ADDENDUM  REPORT: 08/20/2022 14:47  CLINICAL DATA:  67 Year-old African American Female  EXAM: Cardiac/Coronary  CTA  TECHNIQUE: The patient was scanned on a [employer].  FINDINGS: Scan was triggered in the descending thoracic aorta. Axial non-contrast 3 mm slices were carried out through the heart. The data set was analyzed on a dedicated work station and scored using the [place]. Gantry rotation speed was 250 msecs and collimation was .6 mm. 0.8 mg of sl NTG was given. The 3D data set was reconstructed in 5% intervals of the 67-82 % of the R-R cycle. Diastolic phases were analyzed on a dedicated work station using MPR, MIP and VRT modes. The patient received 100 cc of contrast.  Coronary Arteries:  Normal coronary origin.  Right dominance.  Coronary Calcium Score:  Left main: 0  Left anterior descending artery: 7  Left circumflex artery: 0  Right coronary artery: 1  Total: 8  Percentile: 69th for age, sex, and race matched control.  RCA is a large dominant artery that gives rise to marginal, PDA and PLA. Minimal calcified plaque (< 25%) mid RCA.  Left main is a large artery that gives rise to LAD and LCX arteries. There is no significant plaque.  LAD is a large vessel that gives rise to multiple diagonal vessels. Minimal calcified plaque (< 25%) proximal LAD.  LCX is a non-dominant artery that gives rise to one large, branching OM1. There is no significant plaque.  Other findings:  Aorta: Normal size.  No calcifications.  No dissection.  Main Pulmonary Artery: Normal size of the pulmonary artery.  Systemic Veins: Normal drainage  Aortic Valve:  Tri-leaflet.  No calcifications.  Mitral valve: No calcifications.  Normal pulmonary vein drainage into the left atrium.  Normal left atrial appendage without a thrombus.  Small left atrial diverticulum noted.  Additionally small PFO noted seen on interatrial septum cine.  The above is normal  variant anatomy.  Left Ventricle: Normal size  Left Atrium: Normal size  Right Ventricle: Normal size  Right Atrium: Normal size  Pericardium: Normal thickness  Extra-cardiac findings: See attached radiology report for non-cardiac structures.  Artifact: Stair step cardiac motion artifact  IMPRESSION: 1. Coronary  calcium score of 8. This was 69th percentile for age, sex, and race matched control.  2. Normal coronary origin with Right dominance.  3. CAD-RADS 1. Minimal non-obstructive CAD (1-24%). Consider non-atherosclerotic causes of chest pain. Consider preventive therapy and risk factor modification.  RECOMMENDATIONS: RECOMMENDATIONS The proposed cut-off value of 1,651 AU yielded a 93 % sensitivity and 75 % specificity in grading AS severity in patients with classical low-flow, low-gradient AS. Proposed different cut-off values to define severe AS for men and women as 2,065 AU and 1,274 AU, respectively. The joint European and American recommendations for the assessment of AS consider the aortic valve calcium score as a continuum - a very high calcium score suggests severe AS and a low calcium score suggests severe AS is unlikely.  Kerman Passey, et al. 2017 ESC/EACTS Guidelines for the management of valvular heart disease. Eur Heart J 2017;38:2739-91.  Coronary artery calcium (CAC) score is a strong predictor of incident coronary heart disease (CHD) and provides predictive information beyond traditional risk factors. CAC scoring is reasonable to use in the decision to withhold, postpone, or initiate statin therapy in intermediate-risk or selected borderline-risk asymptomatic adults (age 46-75 years and LDL-C >=70 to <190 mg/dL) who do not have diabetes or established atherosclerotic cardiovascular disease (ASCVD).* In intermediate-risk (10-year ASCVD risk >=7.5% to <20%) adults or selected borderline-risk (10-year ASCVD risk >=5% to <7.5%) adults  in whom a CAC score is measured for the purpose of making a treatment decision the following recommendations have been made:  If CAC = 0, it is reasonable to withhold statin therapy and reassess in 5 to 10 years, as long as higher risk conditions are absent (diabetes mellitus, family history of premature CHD in first degree relatives (males <55 years; females <65 years), cigarette smoking, LDL >=190 mg/dL or other independent risk factors).  If CAC is 1 to 99, it is reasonable to initiate statin therapy for patients >=50 years of age.  If CAC is >=100 or >=75th percentile, it is reasonable to initiate statin therapy at any age.  Cardiology referral should be considered for patients with CAC scores =400 or >=75th percentile.  *2018 AHA/ACC/AACVPR/AAPA/ABC/ACPM/ADA/AGS/APhA/ASPC/NLA/PCNA Guideline on the Management of Blood Cholesterol: A Report of the American College of Cardiology/American Heart Association Task Force on Clinical Practice Guidelines. J Am Coll Cardiol. 2019;73(24):3168-3209.  Rudean Haskell, MD   Electronically Signed By: Rudean Haskell M.D. On: 08/20/2022 14:47  Narrative EXAM: OVER-READ INTERPRETATION  CT CHEST  The following report is a limited chest CT over-read performed by radiologist Dr. Lindaann Slough [facility] on 08/18/2022. This over-read does not include interpretation of cardiac or coronary anatomy or pathology. The coronary CTA interpretation by the cardiologist is attached.  COMPARISON:  Chest radiographs 06/30/2022.  FINDINGS: Mediastinum/Nodes: No enlarged lymph nodes within the visualized mediastinum.  Lungs/Pleura: There is no pleural effusion. Mild dependent atelectasis at both lung bases.  Upper abdomen: No significant findings in the visualized upper abdomen.  Musculoskeletal/Chest wall: No chest wall mass or suspicious osseous findings within the visualized chest. Mild thoracic  spondylosis.  IMPRESSION: No significant extracardiac findings within the visualized chest.  Electronically Signed: By: Richardean Sale M.D. On: 08/18/2022 15:40          Recent Labs: 02/15/2022: TSH 1.200 10/27/2022: ALT 11; BUN 12; Creat 0.80; Hemoglobin 14.2; Platelets 235; Potassium 4.6; Sodium 141  Recent Lipid Panel    Component Value Date/Time   CHOL 229 (H) 05/21/2021 0825   TRIG  64 05/21/2021 0825   HDL 72 05/21/2021 0825   CHOLHDL 3.2 05/21/2021 0825   VLDL 10 10/25/2011 0330   LDLCALC 141 (H) 05/21/2021 0825       Physical Exam:    VS:  BP 130/68   Pulse 80   Ht '5\' 6"'$  (1.676 m)   Wt 195 lb 12.8 oz (88.8 kg)   SpO2 98%   BMI 31.60 kg/m     Wt Readings from Last 3 Encounters:  11/03/22 195 lb 12.8 oz (88.8 kg)  10/26/22 196 lb 6.4 oz (89.1 kg)  07/01/22 195 lb 6.4 oz (88.6 kg)    GEN:  Well nourished, well developed in no acute distress HEENT: Normal NECK: No JVD; No carotid bruits LYMPHATICS: No lymphadenopathy CARDIAC: RRR, subtle soft murmur no rubs, gallops, subtle RV impulse RESPIRATORY:  Clear to auscultation without rales, wheezing or rhonchi  ABDOMEN: Soft, non-tender, non-distended MUSCULOSKELETAL:  No edema; No deformity  SKIN: Warm and dry NEUROLOGIC:  Alert and oriented x 3 PSYCHIATRIC:  Normal affect   ASSESSMENT:    1. Precordial chest pain   2. Coronary artery calcification   3. Mixed hyperlipidemia   4. Sjogren's syndrome, with unspecified organ involvement ([place])     PLAN:    Minimal non obstructive CAD HLD - discussed dietary interventions (1 hr treadmill use, decreasing salt intake, decreasing processed foods) - LDL direct and Lp(a) today - would offer rovastatin 5 vs recheck after above based on results  Sjogren's syndrome - no evidence of RV dysfunction  One year me or APP       Medication Adjustments/Labs and Tests Ordered: Current medicines are reviewed at length with the patient today.  Concerns regarding  medicines are outlined above.  Orders Placed This Encounter  Procedures   Direct LDL   Lipoprotein A (LPA)   No orders of the defined types were placed in this encounter.   Patient Instructions  Medication Instructions:  Your physician recommends that you continue on your current medications as directed. Please refer to the Current Medication list given to you today.  *If you need a refill on your cardiac medications before your next appointment, please call your pharmacy*  Lab Work: TODAY: LDL direct and LPa If you have labs (blood work) drawn today and your tests are completely normal, you will receive your results only by: [place] (if you have MyChart) OR A paper copy in the mail If you have any lab test that is abnormal or we need to change your treatment, we will call you to review the results.  Follow-Up: At [facility], you and your health needs are our priority.  As part of our continuing mission to provide you with exceptional heart care, we have created designated Provider Care Teams.  These Care Teams include your primary Cardiologist (physician) and Advanced Practice Providers (APPs -  Physician Assistants and Nurse Practitioners) who all work together to provide you with the care you need, when you need it.  Your next appointment:   1 year(s)  Provider:   Werner Lean, MD       Signed, Werner Lean, MD  11/03/2022 9:32 AM    [place]

## 2022-11-02 NOTE — Progress Notes (Signed)
IFE-normal pattern. No monoclonal proteins.

## 2022-11-03 ENCOUNTER — Ambulatory Visit: Payer: Medicare HMO | Attending: Internal Medicine | Admitting: Internal Medicine

## 2022-11-03 ENCOUNTER — Encounter: Payer: Self-pay | Admitting: Internal Medicine

## 2022-11-03 VITALS — BP 130/68 | HR 80 | Ht 66.0 in | Wt 195.8 lb

## 2022-11-03 DIAGNOSIS — I2584 Coronary atherosclerosis due to calcified coronary lesion: Secondary | ICD-10-CM | POA: Diagnosis not present

## 2022-11-03 DIAGNOSIS — M3500 Sicca syndrome, unspecified: Secondary | ICD-10-CM | POA: Diagnosis not present

## 2022-11-03 DIAGNOSIS — R072 Precordial pain: Secondary | ICD-10-CM

## 2022-11-03 DIAGNOSIS — E782 Mixed hyperlipidemia: Secondary | ICD-10-CM

## 2022-11-03 DIAGNOSIS — I2510 Atherosclerotic heart disease of native coronary artery without angina pectoris: Secondary | ICD-10-CM | POA: Insufficient documentation

## 2022-11-03 NOTE — Patient Instructions (Signed)
Medication Instructions:  Your physician recommends that you continue on your current medications as directed. Please refer to the Current Medication list given to you today.  *If you need a refill on your cardiac medications before your next appointment, please call your pharmacy*  Lab Work: TODAY: LDL direct and LPa If you have labs (blood work) drawn today and your tests are completely normal, you will receive your results only by: [place] (if you have MyChart) OR A paper copy in the mail If you have any lab test that is abnormal or we need to change your treatment, we will call you to review the results.  Follow-Up: At [facility], you and your health needs are our priority.  As part of our continuing mission to provide you with exceptional heart care, we have created designated Provider Care Teams.  These Care Teams include your primary Cardiologist (physician) and Advanced Practice Providers (APPs -  Physician Assistants and Nurse Practitioners) who all work together to provide you with the care you need, when you need it.  Your next appointment:   1 year(s)  Provider:   Werner Lean, MD

## 2022-11-05 LAB — LDL CHOLESTEROL, DIRECT: LDL Direct: 149 mg/dL — ABNORMAL HIGH (ref 0–99)

## 2022-11-05 LAB — LIPOPROTEIN A (LPA): Lipoprotein (a): 395.4 nmol/L — ABNORMAL HIGH (ref <75.0)

## 2022-11-06 ENCOUNTER — Encounter: Payer: Self-pay | Admitting: Internal Medicine

## 2022-11-06 DIAGNOSIS — E782 Mixed hyperlipidemia: Secondary | ICD-10-CM

## 2022-11-06 DIAGNOSIS — I2510 Atherosclerotic heart disease of native coronary artery without angina pectoris: Secondary | ICD-10-CM

## 2022-11-07 ENCOUNTER — Other Ambulatory Visit: Payer: Self-pay | Admitting: Obstetrics and Gynecology

## 2022-11-07 DIAGNOSIS — Z1231 Encounter for screening mammogram for malignant neoplasm of breast: Secondary | ICD-10-CM

## 2022-11-09 DIAGNOSIS — M542 Cervicalgia: Secondary | ICD-10-CM | POA: Diagnosis not present

## 2022-11-10 DIAGNOSIS — M542 Cervicalgia: Secondary | ICD-10-CM | POA: Diagnosis not present

## 2022-11-10 NOTE — Telephone Encounter (Signed)
Called pt will come in for FLP on 03/01/23.

## 2022-11-10 NOTE — Addendum Note (Signed)
Addended by: Precious Gilding on: 11/10/2022 10:27 AM   Modules accepted: Orders

## 2022-11-16 ENCOUNTER — Ambulatory Visit: Payer: 59 | Admitting: Rheumatology

## 2022-11-16 DIAGNOSIS — M542 Cervicalgia: Secondary | ICD-10-CM | POA: Diagnosis not present

## 2022-11-16 DIAGNOSIS — Z6830 Body mass index (BMI) 30.0-30.9, adult: Secondary | ICD-10-CM | POA: Diagnosis not present

## 2022-11-16 DIAGNOSIS — M79672 Pain in left foot: Secondary | ICD-10-CM | POA: Diagnosis not present

## 2022-11-16 DIAGNOSIS — Z01419 Encounter for gynecological examination (general) (routine) without abnormal findings: Secondary | ICD-10-CM | POA: Diagnosis not present

## 2022-11-24 ENCOUNTER — Ambulatory Visit: Payer: 59 | Admitting: Rheumatology

## 2022-11-24 DIAGNOSIS — M542 Cervicalgia: Secondary | ICD-10-CM | POA: Diagnosis not present

## 2022-11-30 DIAGNOSIS — M542 Cervicalgia: Secondary | ICD-10-CM | POA: Diagnosis not present

## 2022-12-01 ENCOUNTER — Ambulatory Visit: Payer: 59 | Admitting: Rheumatology

## 2022-12-07 DIAGNOSIS — M542 Cervicalgia: Secondary | ICD-10-CM | POA: Diagnosis not present

## 2022-12-23 ENCOUNTER — Ambulatory Visit
Admission: RE | Admit: 2022-12-23 | Discharge: 2022-12-23 | Disposition: A | Discharge status: Home / Self Care | Payer: Medicare HMO | Source: Ambulatory Visit | Attending: Obstetrics and Gynecology | Admitting: Obstetrics and Gynecology

## 2022-12-23 DIAGNOSIS — Z1231 Encounter for screening mammogram for malignant neoplasm of breast: Secondary | ICD-10-CM | POA: Diagnosis not present

## 2023-02-11 LAB — AMB RESULTS CONSOLE CBG: Glucose: 98

## 2023-02-11 NOTE — Progress Notes (Signed)
Please check on PCP needs. No SDOH needs at this itme.

## 2023-02-16 ENCOUNTER — Ambulatory Visit
Admission: RE | Admit: 2023-02-16 | Discharge: 2023-02-16 | Disposition: A | Discharge status: Home / Self Care | Payer: Medicare HMO | Source: Ambulatory Visit | Attending: Internal Medicine | Admitting: Internal Medicine

## 2023-02-16 ENCOUNTER — Other Ambulatory Visit (HOSPITAL_COMMUNITY)
Admission: RE | Admit: 2023-02-16 | Discharge: 2023-02-16 | Disposition: A | Discharge status: Home / Self Care | Payer: Medicare HMO | Source: Ambulatory Visit | Attending: Internal Medicine | Admitting: Internal Medicine

## 2023-02-16 DIAGNOSIS — E041 Nontoxic single thyroid nodule: Secondary | ICD-10-CM | POA: Diagnosis not present

## 2023-02-16 HISTORY — PX: BIOPSY THYROID: PRO38

## 2023-02-16 NOTE — Procedures (Signed)
Successful US guided FNA of left mid thyroid nodule No complications. See PACS for full report.    Alex Gardener, AGNP-BC 02/16/2023, 3:44 PM

## 2023-02-19 ENCOUNTER — Encounter: Payer: Self-pay | Admitting: Internal Medicine

## 2023-02-19 LAB — CYTOLOGY - NON PAP

## 2023-03-01 ENCOUNTER — Ambulatory Visit: Payer: Medicare HMO | Attending: Internal Medicine

## 2023-03-01 DIAGNOSIS — E782 Mixed hyperlipidemia: Secondary | ICD-10-CM

## 2023-03-01 DIAGNOSIS — I2584 Coronary atherosclerosis due to calcified coronary lesion: Secondary | ICD-10-CM | POA: Diagnosis not present

## 2023-03-01 DIAGNOSIS — I2510 Atherosclerotic heart disease of native coronary artery without angina pectoris: Secondary | ICD-10-CM | POA: Diagnosis not present

## 2023-03-01 LAB — LIPID PANEL
Chol/HDL Ratio: 3.2 ratio (ref 0.0–4.4)
Cholesterol, Total: 206 mg/dL — ABNORMAL HIGH (ref 100–199)
HDL: 65 mg/dL (ref >39)
LDL Chol Calc (NIH): 132 mg/dL — ABNORMAL HIGH (ref 0–99)
Triglycerides: 52 mg/dL (ref 0–149)
VLDL Cholesterol Cal: 9 mg/dL (ref 5–40)

## 2023-03-02 ENCOUNTER — Encounter: Payer: Self-pay | Admitting: Internal Medicine

## 2023-03-15 ENCOUNTER — Encounter: Payer: Self-pay | Admitting: *Deleted

## 2023-03-15 DIAGNOSIS — H52211 Irregular astigmatism, right eye: Secondary | ICD-10-CM | POA: Diagnosis not present

## 2023-03-15 NOTE — Progress Notes (Signed)
Pt attended 02/11/23 screening event where her b/p was 127/86 and her blood sugar was 98. At the event, the pt did not identify her PCP and did not identify any SDOH insecurities. During the post-event, phone call pt confirmed her PCP is the same as the PCP listed in [facility], Dr. Dorothyann Peng at [facility]. Per chart review, pt's last office visit with Dr. Allyne Gee was 06/24/22 and pt has used My Chart to message her PCP throughout the past 12 months. Chart review also indicates that pt has ongoing specialty care including cardiology and rheumatology. No additional health equity team support indicated at this time.

## 2023-04-11 DIAGNOSIS — M50322 Other cervical disc degeneration at C5-C6 level: Secondary | ICD-10-CM | POA: Diagnosis not present

## 2023-04-11 DIAGNOSIS — M9901 Segmental and somatic dysfunction of cervical region: Secondary | ICD-10-CM | POA: Diagnosis not present

## 2023-04-12 NOTE — Progress Notes (Deleted)
Office Visit Note  Patient: [name] [name]             Date of Birth: 1956-03-09           MRN: [id number]             PCP: Dorothyann Peng, MD Referring: Dorothyann Peng, MD Visit Date: 04/26/2023 Occupation: @GUAROCC @  Subjective:  No chief complaint on file.   History of Present Illness: [name] [name] is a 67 y.o. female with history of sjogren's syndrome and osteoarthritis. She is not currently taking any immunosuppressive agents. Patient continues to have symptoms of dry eyes.    Her only DEXA scan was on 12/14/2004, which revealed  LUMBAR SPINE (L1-L4)   BMD 1.212   T(% of young adult value): 0.6   Z(% adult age match value): 1.2     HIP(NECK)   BMD 1.015   T(% of young adult value): 0.4   Z(% adult age match value): 0.8     Activities of Daily Living:  Patient reports morning stiffness for *** {minute/hour:19697}.   Patient {ACTIONS;DENIES/REPORTS:21021675::"Denies"} nocturnal pain.  Difficulty dressing/grooming: {ACTIONS;DENIES/REPORTS:21021675::"Denies"} Difficulty climbing stairs: {ACTIONS;DENIES/REPORTS:21021675::"Denies"} Difficulty getting out of chair: {ACTIONS;DENIES/REPORTS:21021675::"Denies"} Difficulty using hands for taps, buttons, cutlery, and/or writing: {ACTIONS;DENIES/REPORTS:21021675::"Denies"}  No Rheumatology ROS completed.   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/2022   Primary osteoarthritis of both knees 07/30/2018   Primary osteoarthritis of both hands 07/30/2018   Foot pain 05/15/2018   OSA on CPAP 02/23/2017   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   Snoring 03/25/2015   Hypersomnia with sleep apnea 03/25/2015   Multinodular goiter (nontoxic) 02/08/2012   Acute pancreatitis 10/26/2011   Nausea and vomiting in adult 10/26/2011   Dehydration 10/26/2011   Gastroenteritis 10/26/2011   Hypokalemia 10/26/2011   Elevated liver function tests 10/26/2011   Acid reflux    Thyroid disease     Past Medical History:  Diagnosis Date   Abnormal liver enzymes    Acid reflux    Arthritis    back and neck   Bloating    Bronchitis    Chest pain, unspecified    Diarrhea    Dyspepsia    Esophageal reflux    H/O acute pancreatitis    H/O hiatal hernia    Headache(784.0)    migraines   Hemorrhage of rectum and anus    Hypercholesteremia    Nausea    Sjogren's disease (HCC)    Spinal stenosis, cervical region    Thyroid disease    Thyroid nodule     Family History  Problem Relation Age of Onset   Colon cancer Mother    Pancreatic cancer Brother    Healthy Son    Healthy Daughter    Past Surgical History:  Procedure Laterality Date   ABDOMINAL HYSTERECTOMY  2007   total with BSO   CHOLECYSTECTOMY  1985   COLONOSCOPY     ECTOPIC PREGNANCY SURGERY  1984   ESOPHAGOGASTRODUODENOSCOPY     EUS  12/21/2011   Procedure: ESOPHAGEAL ENDOSCOPIC ULTRASOUND (EUS) RADIAL;  Surgeon: Willis Modena, MD;  Location: [facility];  Service: Endoscopy;  Laterality: N/A;   eye cautery     EYE SURGERY     lasik   EYE SURGERY Left    cataract extraction    KNEE ARTHROSCOPY Right 06/11/2020   torn meniscus    Social History   Social History Narrative   Married.   Drinks occasional sweet tea.   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   Immunization History  Administered Date(s) Administered   Influenza,inj,Quad PF,6+ Mos 06/24/2022   PFIZER(Purple Top)SARS-COV-2 Vaccination 11/22/2019, 12/13/2019, 07/01/2020, 03/26/2021   Tdap 07/13/2017   Zoster Recombinant(Shingrix) 02/15/2022, 05/10/2022      Objective: Vital Signs: There were no vitals taken for this visit.   Physical Exam   Musculoskeletal Exam: ***  CDAI Exam: CDAI Score: -- Patient Global: --; Provider Global: -- Swollen: --; Tender: -- Joint Exam 04/26/2023   No joint exam has been documented for this visit   There is currently no information documented on the homunculus. Go to the Rheumatology activity and complete the homunculus joint exam.  Investigation: No additional findings.  Imaging: No results found.  Recent Labs: Lab Results  Component Value Date   WBC 5.5 10/27/2022   HGB 14.2 10/27/2022   PLT 235 10/27/2022   NA 141 10/27/2022   K 4.6 10/27/2022   CL 105 10/27/2022   CO2 26 10/27/2022   GLUCOSE 86 10/27/2022   BUN 12 10/27/2022   CREATININE 0.80 10/27/2022   BILITOT 0.8 10/27/2022   ALKPHOS 64 01/09/2021   AST 14 10/27/2022   ALT 11 10/27/2022   PROT 6.9 10/27/2022   PROT 7.2 10/27/2022   ALBUMIN 3.6 01/09/2021   CALCIUM 9.9 10/27/2022   GFRAA 88 09/29/2020    Speciality Comments: PLQ eye exam: 08/27/2019 normal. [facility]. Follow up in 6 months. PLQ started 07/2019  Procedures:  No procedures performed Allergies: Morphine and codeine, Other, and Sulfa antibiotics   Assessment / Plan:     Visit Diagnoses: No diagnosis found.  Orders: No orders of the defined types were placed in this encounter.  No orders of the defined types were placed in this encounter.   Face-to-face time spent with patient was *** minutes. Greater than 50% of time was spent in counseling and coordination of care.  Follow-Up Instructions: No follow-ups on file.   Ellen Henri, CMA  Note - This record has been created using Animal nutritionist.  Chart creation errors have been sought, but may not always  have been located. Such creation errors do not reflect on  the standard of medical care.

## 2023-04-13 DIAGNOSIS — M25512 Pain in left shoulder: Secondary | ICD-10-CM | POA: Diagnosis not present

## 2023-04-13 DIAGNOSIS — M79644 Pain in right finger(s): Secondary | ICD-10-CM | POA: Diagnosis not present

## 2023-04-20 ENCOUNTER — Encounter: Payer: Self-pay | Admitting: Internal Medicine

## 2023-04-26 ENCOUNTER — Ambulatory Visit: Payer: Medicare HMO | Attending: Rheumatology | Admitting: Rheumatology

## 2023-04-26 ENCOUNTER — Encounter: Payer: Self-pay | Admitting: Rheumatology

## 2023-04-26 VITALS — BP 128/78 | HR 64 | Resp 15 | Ht 66.0 in | Wt 189.8 lb

## 2023-04-26 DIAGNOSIS — Z8639 Personal history of other endocrine, nutritional and metabolic disease: Secondary | ICD-10-CM

## 2023-04-26 DIAGNOSIS — M25461 Effusion, right knee: Secondary | ICD-10-CM

## 2023-04-26 DIAGNOSIS — R768 Other specified abnormal immunological findings in serum: Secondary | ICD-10-CM

## 2023-04-26 DIAGNOSIS — Z79899 Other long term (current) drug therapy: Secondary | ICD-10-CM

## 2023-04-26 DIAGNOSIS — M25512 Pain in left shoulder: Secondary | ICD-10-CM

## 2023-04-26 DIAGNOSIS — Z8669 Personal history of other diseases of the nervous system and sense organs: Secondary | ICD-10-CM

## 2023-04-26 DIAGNOSIS — Z13820 Encounter for screening for osteoporosis: Secondary | ICD-10-CM | POA: Diagnosis not present

## 2023-04-26 DIAGNOSIS — M170 Bilateral primary osteoarthritis of knee: Secondary | ICD-10-CM

## 2023-04-26 DIAGNOSIS — M3509 Sicca syndrome with other organ involvement: Secondary | ICD-10-CM

## 2023-04-26 DIAGNOSIS — M19041 Primary osteoarthritis, right hand: Secondary | ICD-10-CM

## 2023-04-26 DIAGNOSIS — G8929 Other chronic pain: Secondary | ICD-10-CM

## 2023-04-26 DIAGNOSIS — M19042 Primary osteoarthritis, left hand: Secondary | ICD-10-CM

## 2023-04-26 DIAGNOSIS — R5383 Other fatigue: Secondary | ICD-10-CM | POA: Diagnosis not present

## 2023-04-26 DIAGNOSIS — R053 Chronic cough: Secondary | ICD-10-CM

## 2023-04-26 NOTE — Progress Notes (Addendum)
Office Visit Note  Patient: [name] [name]             Date of Birth: 1956-01-19           MRN: [id number]             PCP: Dorothyann Peng, MD Referring: Dorothyann Peng, MD Visit Date: 04/26/2023 Occupation: @GUAROCC @  Subjective:  Pain in her left shoulder, dry eyes   History of Present Illness: [name] [name] is a 67 y.o. female with history of sjogren's syndrome and osteoarthritis. She is not currently taking any immunosuppressive agents. Patient continues to have symptoms of dry eyes.  She formerly used Systane eye drops but does not utilize them now as she does not experience any benefit from them.  She has also noticed occasional pain and a "knot" on the L side of her jaw .  She does not feel this knot today.  She additionally endorses occasional purpling of her fingers and toes when it is cold but she denies that they hurt or turn white.    She did have a Cortisone injection into her left shoulder this month at [facility], which helped with her shoulder pain.  She has intermittent pain of her trochanteric bursae bilaterally.  One year ago, she had trouble with a productive cough that wouldn't abate.  She says it lasted for months.  Even today she states she has intermittent mucus in her throat that she has to clear with a cough.  She denies any shortness of breath.   Activities of Daily Living:  Patient reports morning stiffness for 5 minutes.   Patient Denies nocturnal pain.  Difficulty dressing/grooming: Denies Difficulty climbing stairs: Denies Difficulty getting out of chair: Denies Difficulty using hands for taps, buttons, cutlery, and/or writing: Denies  Review of Systems  Constitutional:  Positive for fatigue.  HENT:  Negative for mouth sores and mouth dryness.   Eyes:  Positive for dryness.  Respiratory:  Negative for shortness of breath.   Cardiovascular:  Negative for chest pain and palpitations.  Gastrointestinal:  Negative for blood in stool, constipation and  diarrhea.  Endocrine: Negative for increased urination.  Genitourinary:  Negative for involuntary urination.  Musculoskeletal:  Positive for joint pain, joint pain and morning stiffness. Negative for gait problem, joint swelling, myalgias, muscle weakness, muscle tenderness and myalgias.  Skin:  Positive for color change. Negative for rash, hair loss and sensitivity to sunlight.  Allergic/Immunologic: Negative for susceptible to infections.  Neurological:  Positive for numbness. Negative for dizziness and headaches.  Hematological:  Negative for swollen glands.  Psychiatric/Behavioral:  Positive for sleep disturbance. Negative for depressed mood. The patient is not nervous/anxious.     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/2022   Primary osteoarthritis of both knees 07/30/2018   Primary osteoarthritis of both hands 07/30/2018   Foot pain 05/15/2018   OSA on CPAP 02/23/2017   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   Snoring 03/25/2015   Hypersomnia with sleep apnea 03/25/2015   Multinodular goiter (nontoxic) 02/08/2012   Acute pancreatitis 10/26/2011   Nausea and vomiting in  adult 10/26/2011   Dehydration 10/26/2011   Gastroenteritis 10/26/2011   Hypokalemia 10/26/2011   Elevated liver function tests 10/26/2011   Acid reflux    Thyroid disease     Past Medical History:  Diagnosis Date   Abnormal liver enzymes    Acid reflux    Arthritis    back and neck   Bloating    Bronchitis    Chest pain, unspecified    Diarrhea    Dyspepsia    Esophageal reflux    H/O acute pancreatitis     H/O hiatal hernia    Headache(784.0)    migraines   Hemorrhage of rectum and anus    Hypercholesteremia    Nausea    Sjogren's disease (HCC)    Spinal stenosis, cervical region    Thyroid disease    Thyroid nodule     Family History  Problem Relation Age of Onset   Colon cancer Mother    Pancreatic cancer Brother    Healthy Daughter    Healthy Son    Past Surgical History:  Procedure Laterality Date   ABDOMINAL HYSTERECTOMY  2007   total with BSO   BIOPSY THYROID  02/16/2023   CHOLECYSTECTOMY  1985   COLONOSCOPY     ECTOPIC PREGNANCY SURGERY  1984   ESOPHAGOGASTRODUODENOSCOPY     EUS  12/21/2011   Procedure: ESOPHAGEAL ENDOSCOPIC ULTRASOUND (EUS) RADIAL;  Surgeon: Willis Modena, MD;  Location: [facility];  Service: Endoscopy;  Laterality: N/A;   eye cautery     EYE SURGERY     lasik   EYE SURGERY Left    cataract extraction    KNEE ARTHROSCOPY Right 06/11/2020   torn meniscus    Social History   Social History Narrative   Married.   Drinks occasional sweet tea.   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   Immunization History  Administered Date(s) Administered   Influenza,inj,Quad PF,6+ Mos 06/24/2022   PFIZER(Purple Top)SARS-COV-2 Vaccination 11/22/2019, 12/13/2019, 07/01/2020, 03/26/2021   Tdap 07/13/2017   Zoster Recombinant(Shingrix) 02/15/2022, 05/10/2022     Objective: Vital Signs: BP 128/78 (BP Location: Left Arm, Patient Position: Sitting, Cuff Size: Normal)   Pulse 64   Resp 15   Ht 5\' 6"  (1.676 m)   Wt 189 lb 12.8 oz (86.1 kg)   BMI 30.63 kg/m    Physical Exam Constitutional:      Appearance: Normal appearance.  HENT:     Head: Normocephalic and atraumatic.  Eyes:     Extraocular Movements: Extraocular movements intact.  Cardiovascular:     Rate and Rhythm: Normal rate and regular rhythm.     Heart sounds: Normal heart sounds.  Pulmonary:     Effort: Pulmonary effort is normal.     Breath sounds:  Normal breath sounds.  Musculoskeletal:     Cervical back: Rigidity and tenderness present.  Skin:    General: Skin is warm and dry.     Capillary Refill: Capillary refill takes less than 2 seconds. Allen test very slightly slower than normal Neurological:     Mental Status: She is alert and oriented to person, place, and time.  Psychiatric:        Mood and Affect: Mood normal.      Musculoskeletal Exam: She had limited range of motion with lateral rotation of the cervical spine.  She had bilateral trapezius spasm.  Thoracic and lumbar spine have good range of motion.  No midline spinal tenderness.  Shoulder joints  have good range of motion.  Some tenderness at the deltoid insertion site bilaterally.  Elbow joints, wrist joints, MCPs, PIPs, DIPs have good range of motion with no synovitis.  PIP and DIP thickening consistent with osteoarthritis of both hands.  Hip joints have good range of motion without discomfort.  Some tenderness over the trochanteric bursa bilaterally.  Knee joints have good range of motion with some warmth and effusion.  There was no tenderness over ankles or MTPs.     CDAI Exam: CDAI Score: -- Patient Global: --; Provider Global: -- Swollen: --; Tender: -- Joint Exam 04/26/2023   No joint exam has been documented for this visit   There is currently no information documented on the homunculus. Go to the Rheumatology activity and complete the homunculus joint exam.  Investigation: No additional findings.  Imaging: No results found.  Recent Labs: Lab Results  Component Value Date   WBC 5.5 10/27/2022   HGB 14.2 10/27/2022   PLT 235 10/27/2022   NA 141 10/27/2022   K 4.6 10/27/2022   CL 105 10/27/2022   CO2 26 10/27/2022   GLUCOSE 86 10/27/2022   BUN 12 10/27/2022   CREATININE 0.80 10/27/2022   BILITOT 0.8 10/27/2022   ALKPHOS 64 01/09/2021   AST 14 10/27/2022   ALT 11 10/27/2022   PROT 6.9 10/27/2022   PROT 7.2 10/27/2022   ALBUMIN 3.6  01/09/2021   CALCIUM 9.9 10/27/2022   GFRAA 88 09/29/2020    Speciality Comments: PLQ eye exam: 08/27/2019 normal. [facility]. Follow up in 6 months. PLQ started 07/2019  Procedures:  No procedures performed Allergies: Morphine and codeine, Other, and Sulfa antibiotics     Assessment / Plan:     Visit Diagnoses: Sjogren's syndrome with other organ involvement (HCC) - ANA 1: 80 speckled, positive Ro antibody.  Patient continues to have symptoms of dry eyes.  She formerly used Systane eye drops but does not utilize them now as she does not experience any benefit from them.  Patient complains of some discoloration in her hands.  No nailbed capillary changes were noted.  She had good capillary refill and her hands were warm to touch.  Patient gives history of lingering productive cough for the last 1 year.  Her lungs were clear to auscultation today. Discussed the increased risk for developing ILD in patients with Sjogren's syndrome.Increased risk for developing lymphoma in patients with Sjogren's syndrome was also discussed. SPEP was within normal limits on 06/02/2022. - PLAN: refer to Pulmonology for the evaluation of chronic cough.  I will obtain labs today.  High risk medication use - She is not currently taking any immunosuppressive agents.  She was treated with Plaquenil in the past which she took for about 3 months.  Antineutrophil cytoplasmic antibody (ANCA) positive -repeat ANCA was negative.  No clinical features of vasculitis. Sed rate has been normal.   Primary osteoarthritis of both hands-she had bilateral PIP and DIP thickening.  No synovitis was noted.    Osteoporosis screening -patient had DEXA scan several years ago.  She was advised to have repeat DEXA with her PCP.  Chronic left shoulder pain -patient had cortisone injection into her left shoulder this month at [facility], which helped with her shoulder pain.  She continues to have intermittent pain in the left side of her  neck and left shoulder.    Effusion, right knee - patient endorses intermittent swelling in her R knee.  Knee has minor effusion today.  Patient is  followed by Dr. Ophelia Charter and had arthroscopic surgery in the past.  Primary osteoarthritis of both knees - Dr. Ophelia Charter - She has good range of motion of both knee joints on examination today.   History of sleep apnea - Uses CPAP  History of thyroid disease - Thyroid biopsy of 02/16/2023 was negative/benign.  Other fatigue  Orders: Orders Placed This Encounter  Procedures   CBC with Differential/Platelet   COMPLETE METABOLIC PANEL WITH GFR   ANA   Anti-DNA antibody, double-stranded   C3 and C4   Sedimentation rate   Urinalysis, Routine w reflex microscopic   Sjogrens syndrome-A extractable nuclear antibody   Ambulatory referral to Pulmonology   No orders of the defined types were placed in this encounter.    Follow-Up Instructions: Return in about 5 months (around 09/26/2023) for Sjogren's, Osteoarthritis.   Pollyann Savoy, MD  Note - This record has been created using Animal nutritionist.  Chart creation errors have been sought, but may not always  have been located. Such creation errors do not reflect on  the standard of medical care.

## 2023-04-29 ENCOUNTER — Encounter: Payer: Self-pay | Admitting: Rheumatology

## 2023-04-29 LAB — URINALYSIS, ROUTINE W REFLEX MICROSCOPIC
Bilirubin Urine: NEGATIVE
Glucose, UA: NEGATIVE
Hgb urine dipstick: NEGATIVE
Ketones, ur: NEGATIVE
Leukocytes,Ua: NEGATIVE
Nitrite: NEGATIVE
Protein, ur: NEGATIVE
Specific Gravity, Urine: 1.008 (ref 1.001–1.035)
pH: 5.5 (ref 5.0–8.0)

## 2023-04-29 LAB — CBC WITH DIFFERENTIAL/PLATELET
Absolute Monocytes: 490 {cells}/uL (ref 200–950)
Basophils Absolute: 26 {cells}/uL (ref 0–200)
Basophils Relative: 0.3 %
Eosinophils Absolute: 189 {cells}/uL (ref 15–500)
Eosinophils Relative: 2.2 %
HCT: 42.9 % (ref 35.0–45.0)
Hemoglobin: 14.2 g/dL (ref 11.7–15.5)
Lymphs Abs: 1763 {cells}/uL (ref 850–3900)
MCH: 28.3 pg (ref 27.0–33.0)
MCHC: 33.1 g/dL (ref 32.0–36.0)
MCV: 85.6 fL (ref 80.0–100.0)
MPV: 10.8 fL (ref 7.5–12.5)
Monocytes Relative: 5.7 %
Neutro Abs: 6132 {cells}/uL (ref 1500–7800)
Neutrophils Relative %: 71.3 %
Platelets: 243 10*3/uL (ref 140–400)
RBC: 5.01 10*6/uL (ref 3.80–5.10)
RDW: 13.3 % (ref 11.0–15.0)
Total Lymphocyte: 20.5 %
WBC: 8.6 10*3/uL (ref 3.8–10.8)

## 2023-04-29 LAB — ANTI-DNA ANTIBODY, DOUBLE-STRANDED: ds DNA Ab: <1 [IU]/mL

## 2023-04-29 LAB — COMPLETE METABOLIC PANEL WITH GFR
AG Ratio: 1.9 (calc) (ref 1.0–2.5)
ALT: 23 U/L (ref 6–29)
AST: 19 U/L (ref 10–35)
Albumin: 4.7 g/dL (ref 3.6–5.1)
Alkaline phosphatase (APISO): 102 U/L (ref 37–153)
BUN: 16 mg/dL (ref 7–25)
CO2: 25 mmol/L (ref 20–32)
Calcium: 10.2 mg/dL (ref 8.6–10.4)
Chloride: 103 mmol/L (ref 98–110)
Creat: 0.85 mg/dL (ref 0.50–1.05)
Globulin: 2.5 g/dL (ref 1.9–3.7)
Glucose, Bld: 87 mg/dL (ref 65–99)
Potassium: 5 mmol/L (ref 3.5–5.3)
Sodium: 140 mmol/L (ref 135–146)
Total Bilirubin: 0.9 mg/dL (ref 0.2–1.2)
Total Protein: 7.2 g/dL (ref 6.1–8.1)
eGFR: 76 mL/min/{1.73_m2} (ref >=60)

## 2023-04-29 LAB — ANA: Anti Nuclear Antibody (ANA): POSITIVE — AB

## 2023-04-29 LAB — C3 AND C4
C3 Complement: 124 mg/dL (ref 83–193)
C4 Complement: 38 mg/dL (ref 15–57)

## 2023-04-29 LAB — ANTI-NUCLEAR AB-TITER (ANA TITER): ANA Titer 1: 1:40 {titer} — ABNORMAL HIGH

## 2023-04-29 LAB — SJOGRENS SYNDROME-A EXTRACTABLE NUCLEAR ANTIBODY: SSA (Ro) (ENA) Antibody, IgG: 7.7 AI — AB

## 2023-04-29 LAB — SEDIMENTATION RATE: Sed Rate: 6 mm/h (ref 0–30)

## 2023-04-30 NOTE — Progress Notes (Signed)
ANA titer is low and stable.  SSA antibody is positive, double-stranded DNA negative, complements normal, sed rate normal, CBC normal, CMP normal.  UA normal.  Labs do not indicate an autoimmune disease flare.  No change in treatment advised.

## 2023-05-01 ENCOUNTER — Telehealth: Payer: Self-pay | Admitting: Rheumatology

## 2023-05-01 DIAGNOSIS — R2231 Localized swelling, mass and lump, right upper limb: Secondary | ICD-10-CM | POA: Diagnosis not present

## 2023-05-01 NOTE — Telephone Encounter (Signed)
Pt has called [facility] to schedule an appt. The earliest was October 11th, pt wants to know if there were any other offices to see her sooner.

## 2023-05-01 NOTE — Telephone Encounter (Signed)
LMOM for patient to call other offices to see if anyone has an earlier appt and advise if she would like referral changed to a different location.

## 2023-05-03 NOTE — Progress Notes (Unsigned)
[name] [name], female    DOB: [birth year]   MRN: [id number]   Brief patient profile:  37 yobf never smoker with Sjogrens  referred to pulmonary clinic 05/05/2023 by [facility]  for cough with reported onset immediately p started cpap 2017 under Dohlmeir's  direction  and continued x  2 years   and some better  p stopping it  but continued since then nightly.     History of Present Illness  05/05/2023  Pulmonary/ 1st office eval/Elmor Kost  Chief Complaint  Patient presents with   Consult    Productive cough and light green mucus  Sx began when patient started using CPAP.  Around 2018  Dyspnea:  3 miles every other day s stopping fast pace some hills  Cough: much worse at hs / some minimally productive daytime/ slt green tinge   Sleep: bed is flat with 2 pillows nightly sleep disturbance with cough/congestion at least once or twice a night and then aware of it as soon as eyes open in always discolored same tint. SABA use: none  Midepigastric discomfort x 10 secs x 10 years sporadic, does not typically awaken   No obvious day to day or daytime pattern/variability or assoc excess/ purulent sputum or mucus plugs or hemoptysis or cp or chest tightness, subjective wheeze or overt sinus or hb symptoms.    Also denies any obvious fluctuation of symptoms with weather or environmental changes or other aggravating or alleviating factors except as outlined above   No unusual exposure hx or h/o childhood pna/ asthma or knowledge of premature birth.  Current Allergies, Complete Past Medical History, Past Surgical History, Family History, and Social History were reviewed in [employer] record.  ROS  The following are not active complaints unless bolded Hoarseness, sore throat, dysphagia, dental problems, itching, sneezing,  nasal congestion or discharge of excess mucus or purulent secretions, ear ache,   fever, chills, sweats, unintended wt loss or wt gain, classically pleuritic or  exertional cp,  orthopnea pnd or arm/hand swelling  or leg swelling, presyncope, palpitations, abdominal pain, anorexia, nausea, vomiting, diarrhea  or change in bowel habits or change in bladder habits, change in stools or change in urine, dysuria, hematuria,  rash, arthralgias, visual complaints, headache, numbness, weakness or ataxia or problems with walking or coordination,  change in mood or  memory. Raynaud's             Outpatient Medications Prior to Visit  Medication Sig Dispense Refill   Apoaequorin (PREVAGEN) 10 MG CAPS Take by mouth daily at 6 (six) AM.     Ascorbic Acid (VITAMIN C) POWD as needed (immune support).     Cholecalciferol (VITAMIN D3) 250 MCG (10000 UT) capsule Take 10,000 Units by mouth daily.     Cyanocobalamin (VITAMIN B 12 PO) Take by mouth.     GINSENG CHINESE RED PO Take by mouth.     Glucosamine-Chondroitin-MSM 375-300-237.5 MG CAPS Take by mouth.     Omega-3 Fatty Acids (FISH OIL PO) Take by mouth 2 (two) times daily.     omeprazole-sodium bicarbonate (ZEGERID) 40-1100 MG capsule Take 1 capsule by mouth as needed.     Red Yeast Rice Extract (RED YEAST RICE PO) Take 2 tablets by mouth daily.     No facility-administered medications prior to visit.    Past Medical History:  Diagnosis Date   Abnormal liver enzymes    Acid reflux    Arthritis    back and neck  Bloating    Bronchitis    Chest pain, unspecified    Diarrhea    Dyspepsia    Esophageal reflux    H/O acute pancreatitis    H/O hiatal hernia    Headache(784.0)    migraines   Hemorrhage of rectum and anus    Hypercholesteremia    Nausea    Sjogren's disease (HCC)    Spinal stenosis, cervical region    Thyroid disease    Thyroid nodule       Objective:     BP 118/78 (BP Location: Left Arm, Patient Position: Sitting, Cuff Size: Large)   Pulse 70   Temp 98.3 F (36.8 C) (Oral)   Ht 5\' 6"  (1.676 m)   Wt 189 lb 9.6 oz (86 kg)   SpO2 99%   BMI 30.60 kg/m   SpO2: 99 %  Amb  bf dry sounding somewhat harsh cough    HEENT : Oropharynx  clear     Nasal turbinates nl    NECK :  without  apparent JVD/ palpable Nodes/TM    LUNGS: no acc muscle use,  Nl contour chest which is clear to A and P bilaterally without cough on insp maneuvers   CV:  RRR  no s3 or murmur or increase in P2, and no edema   ABD:  soft and nontender with nl inspiratory excursion in the supine position. No bruits or organomegaly appreciated   MS:  Nl gait/ ext warm without deformities Or obvious joint restrictions  calf tenderness, cyanosis or clubbing    SKIN: warm and dry without lesions    NEURO:  alert, approp, nl sensorium with  no motor or cerebellar deficits apparent.   CXR PA and Lateral:   05/05/2023 :    I personally reviewed images and impression is as follows:   no active dz     Assessment   Chronic cough Onset 2017 at bedtime while on cpap, some better off it p 2 years / slt productive green tinted sputum - Sinus CT 05/06/2023 >>>   Most likely this is not a pulmonary problem (though at risk given dx of sjogrens/raynauds)  but rather  a form of Upper airway cough syndrome (previously labeled PNDS),  is so named because it's frequently impossible to sort out how much is  CR/sinusitis with freq throat clearing (which can be related to primary GERD)   vs  causing  secondary (" extra esophageal")  GERD from wide swings in gastric pressure that occur with throat clearing, often  promoting self use of mint and menthol lozenges that reduce the lower esophageal sphincter tone and exacerbate the problem further in a cyclical fashion.   These are the same pts (now being labeled as having "irritable larynx syndrome" by some cough centers) who not infrequently have a history of having failed to tolerate ace inhibitors,  dry powder inhalers or biphosphonates or report having atypical/extraesophageal reflux symptoms that don't respond to standard doses of PPI  and are easily confused as having  aecopd or asthma flares by even experienced allergists/ pulmonologists (myself included).   Rec  Start with max gerd rx and sinus CT with low threshold to refer back to ENT (has seen [facility] ent but no notes in [facility])   F/u in 6 weeks with all meds in hand using a trust but verify approach to confirm accurate Medication  Reconciliation The principal here is that until we are certain that the  patients are doing what we've asked,  it makes no sense to ask them to do more.   Advised: The standardized cough guidelines published in Chest by [place] in 2006 are still the best available and consist of a multiple step process (up to 12!) , not a single office visit,  and are intended  to address this problem logically,  with an alogrithm dependent on response to empiric treatment at  each progressive step  to determine a specific diagnosis with  minimal addtional testing needed. Therefore if adherence is an issue or can't be accurately verified,  it's very unlikely the standard evaluation and treatment will be successful here.    Furthermore, response to therapy (other than acute cough suppression, which should only be used short term with avoidance of narcotic containing cough syrups if possible), can be a gradual process for which the patient is not likely to  perceive immediate benefit.  Unlike going to an eye doctor where the best perscription is almost always the first one and is immediately effective, this is almost never the case in the management of chronic cough syndromes. Therefore the patient needs to commit up front to consistently adhere to recommendations  for up to 6 weeks of therapy directed at the likely underlying problem(s) before the response can be reasonably evaluated.          Each maintenance medication was reviewed in detail including emphasizing most importantly the difference between maintenance and prns and under what circumstances the prns are to be triggered using an action  plan format where appropriate.  Total time for H and P, chart review, counseling,   and generating customized AVS unique to this office visit / same day charting = 45 min new pt eval           Sandrea Hughs, MD 05/05/2023

## 2023-05-05 ENCOUNTER — Ambulatory Visit: Payer: Medicare HMO | Admitting: Internal Medicine

## 2023-05-05 ENCOUNTER — Ambulatory Visit (INDEPENDENT_AMBULATORY_CARE_PROVIDER_SITE_OTHER)
Admission: RE | Admit: 2023-05-05 | Discharge: 2023-05-05 | Disposition: A | Discharge status: Home / Self Care | Payer: Medicare HMO | Source: Ambulatory Visit | Attending: Internal Medicine | Admitting: Internal Medicine

## 2023-05-05 ENCOUNTER — Telehealth: Payer: Self-pay | Admitting: Internal Medicine

## 2023-05-05 ENCOUNTER — Encounter: Payer: Self-pay | Admitting: Internal Medicine

## 2023-05-05 VITALS — BP 118/78 | HR 70 | Temp 98.3°F | Ht 66.0 in | Wt 189.6 lb

## 2023-05-05 DIAGNOSIS — R058 Other specified cough: Secondary | ICD-10-CM | POA: Insufficient documentation

## 2023-05-05 DIAGNOSIS — R059 Cough, unspecified: Secondary | ICD-10-CM | POA: Diagnosis not present

## 2023-05-05 DIAGNOSIS — R053 Chronic cough: Secondary | ICD-10-CM

## 2023-05-05 MED ORDER — OMEPRAZOLE-SODIUM BICARBONATE 40-1100 MG PO CAPS: ORAL_CAPSULE | ORAL | 2 refills | Status: DC

## 2023-05-05 NOTE — Patient Instructions (Addendum)
Continue zegerd Take 30- 60 min before your first and last meals of the day   GERD (REFLUX)  is an extremely common cause of respiratory symptoms just like yours , many times with no obvious heartburn at all.    It can be treated with medication, but also with lifestyle changes including elevation of the head of your bed (ideally with 6 -8inch blocks under the headboard of your bed),  Smoking cessation, avoidance of late meals, excessive alcohol, and avoid fatty foods, chocolate, peppermint, colas, red wine, and acidic juices such as orange juice.  NO MINT OR MENTHOL PRODUCTS SO NO COUGH DROPS  USE SUGARLESS CANDY INSTEAD (Jolley ranchers or Stover's or Life Savers) or even ice chips will also do - the key is to swallow to prevent all throat clearing. NO OIL BASED VITAMINS - use powdered substitutes.  Avoid fish oil when coughing.    My office will be contacting you by phone for referral to sinus CT   - if you don't hear back from my office within one week please call us back or notify us thru MyChart and we'll address it right away.   Please remember to go to the  x-ray department  for your tests - we will call you with the results when they are available   Please schedule a follow up office visit in 4-6 weeks, call sooner if needed with all medications /inhalers/ solutions in hand so we can verify exactly what you are taking. This includes all medications from all doctors and over the counters

## 2023-05-06 NOTE — Assessment & Plan Note (Addendum)
Onset 2017 at bedtime while on cpap, some better off it p 2 years / slt productive green tinted sputum - Sinus CT 05/06/2023 >>>   Most likely this is not a pulmonary problem (though at risk given dx of sjogrens/raynauds)  but rather  a form of Upper airway cough syndrome (previously labeled PNDS),  is so named because it's frequently impossible to sort out how much is  CR/sinusitis with freq throat clearing (which can be related to primary GERD)   vs  causing  secondary (" extra esophageal")  GERD from wide swings in gastric pressure that occur with throat clearing, often  promoting self use of mint and menthol lozenges that reduce the lower esophageal sphincter tone and exacerbate the problem further in a cyclical fashion.   These are the same pts (now being labeled as having "irritable larynx syndrome" by some cough centers) who not infrequently have a history of having failed to tolerate ace inhibitors,  dry powder inhalers or biphosphonates or report having atypical/extraesophageal reflux symptoms that don't respond to standard doses of PPI  and are easily confused as having aecopd or asthma flares by even experienced allergists/ pulmonologists (myself included).   Rec  Start with max gerd rx and sinus CT with low threshold to refer back to ENT (has seen [facility] ent but no notes in [facility])   F/u in 6 weeks with all meds in hand using a trust but verify approach to confirm accurate Medication  Reconciliation The principal here is that until we are certain that the  patients are doing what we've asked, it makes no sense to ask them to do more.   Advised: The standardized cough guidelines published in Chest by [place] in 2006 are still the best available and consist of a multiple step process (up to 12!) , not a single office visit,  and are intended  to address this problem logically,  with an alogrithm dependent on response to empiric treatment at  each progressive step  to determine a specific  diagnosis with  minimal addtional testing needed. Therefore if adherence is an issue or can't be accurately verified,  it's very unlikely the standard evaluation and treatment will be successful here.    Furthermore, response to therapy (other than acute cough suppression, which should only be used short term with avoidance of narcotic containing cough syrups if possible), can be a gradual process for which the patient is not likely to  perceive immediate benefit.  Unlike going to an eye doctor where the best perscription is almost always the first one and is immediately effective, this is almost never the case in the management of chronic cough syndromes. Therefore the patient needs to commit up front to consistently adhere to recommendations  for up to 6 weeks of therapy directed at the likely underlying problem(s) before the response can be reasonably evaluated.          Each maintenance medication was reviewed in detail including emphasizing most importantly the difference between maintenance and prns and under what circumstances the prns are to be triggered using an action plan format where appropriate.  Total time for H and P, chart review, counseling,   and generating customized AVS unique to this office visit / same day charting = 45 min new pt eval

## 2023-05-12 ENCOUNTER — Ambulatory Visit
Admission: RE | Admit: 2023-05-12 | Discharge: 2023-05-12 | Disposition: A | Discharge status: Home / Self Care | Payer: Medicare HMO | Source: Ambulatory Visit | Attending: Internal Medicine | Admitting: Internal Medicine

## 2023-05-12 DIAGNOSIS — R053 Chronic cough: Secondary | ICD-10-CM | POA: Diagnosis not present

## 2023-05-16 ENCOUNTER — Telehealth: Payer: Self-pay

## 2023-05-16 ENCOUNTER — Encounter: Payer: Self-pay | Admitting: Internal Medicine

## 2023-05-16 ENCOUNTER — Ambulatory Visit (INDEPENDENT_AMBULATORY_CARE_PROVIDER_SITE_OTHER): Payer: Medicare HMO | Admitting: Internal Medicine

## 2023-05-16 VITALS — BP 118/80 | HR 76 | Temp 98.5°F | Ht 66.0 in | Wt 190.4 lb

## 2023-05-16 DIAGNOSIS — Z23 Encounter for immunization: Secondary | ICD-10-CM

## 2023-05-16 DIAGNOSIS — I2510 Atherosclerotic heart disease of native coronary artery without angina pectoris: Secondary | ICD-10-CM

## 2023-05-16 DIAGNOSIS — I2584 Coronary atherosclerosis due to calcified coronary lesion: Secondary | ICD-10-CM | POA: Diagnosis not present

## 2023-05-16 DIAGNOSIS — R202 Paresthesia of skin: Secondary | ICD-10-CM | POA: Diagnosis not present

## 2023-05-16 DIAGNOSIS — E041 Nontoxic single thyroid nodule: Secondary | ICD-10-CM | POA: Diagnosis not present

## 2023-05-16 NOTE — Progress Notes (Signed)
I,Victoria T Deloria Lair, CMA,acting as a Neurosurgeon for Gwynneth Aliment, MD.,have documented all relevant documentation on the behalf of Gwynneth Aliment, MD,as directed by  Gwynneth Aliment, MD while in the presence of Gwynneth Aliment, MD.  Subjective:  Patient ID: [name] [name] , female    DOB: 03/04/1956 , 67 y.o.   MRN: [id number]  Chief Complaint  Patient presents with   Thyroid Biopsy     HPI  Patient presents today for thyroid biopsy follow up. She would like further explanation of biopsy results. She is not having any issues with swallowing at this time.      Past Medical History:  Diagnosis Date   Abnormal liver enzymes    Acid reflux    Arthritis    back and neck   Bloating    Bronchitis    Chest pain, unspecified    Diarrhea    Dyspepsia    Esophageal reflux    H/O acute pancreatitis    H/O hiatal hernia    Headache(784.0)    migraines   Hemorrhage of rectum and anus    Hypercholesteremia    Nausea    Sjogren's disease (HCC)    Spinal stenosis, cervical region    Thyroid disease    Thyroid nodule      Family History  Problem Relation Age of Onset   Colon cancer Mother    Pancreatic cancer Brother    Healthy Daughter    Healthy Son      Current Outpatient Medications:    Apoaequorin (PREVAGEN) 10 MG CAPS, Take by mouth daily at 6 (six) AM., Disp: , Rfl:    Ascorbic Acid (VITAMIN C) POWD, as needed (immune support)., Disp: , Rfl:    Cholecalciferol (VITAMIN D3) 250 MCG (10000 UT) capsule, Take 10,000 Units by mouth daily., Disp: , Rfl:    Cyanocobalamin (VITAMIN B 12 PO), Take by mouth., Disp: , Rfl:    GINSENG CHINESE RED PO, Take by mouth., Disp: , Rfl:    Glucosamine-Chondroitin-MSM 375-300-237.5 MG CAPS, Take by mouth., Disp: , Rfl:    Omega-3 Fatty Acids (FISH OIL PO), Take by mouth 2 (two) times daily., Disp: , Rfl:    omeprazole-sodium bicarbonate (ZEGERID) 40-1100 MG capsule, Take 30- 60 min before your first and last meals of the day, Disp: 60  capsule, Rfl: 2   Red Yeast Rice Extract (RED YEAST RICE PO), Take 2 tablets by mouth daily., Disp: , Rfl:    Allergies  Allergen Reactions   Morphine And Codeine Shortness Of Breath   Other     TRILTYE- stomach upset   Sulfa Antibiotics Hives     Review of Systems  Constitutional: Negative.   HENT: Negative.    Respiratory:  Positive for cough.   Cardiovascular: Negative.   Gastrointestinal: Negative.   Neurological:  Positive for numbness.       She c/o numbness of left upper extremity. Sx started a week ago. Denies fall/trauma. She noticed when she first got out of bed.  She denies having any LUE weakness. Numbness goes from left elbow to left wrist. Sx are intermittent. She states she rubs her forearm when the sx occur, and then her sx resolve.  Denies having any pain. Denies h/o carpal tunnel syndrome.  Psychiatric/Behavioral: Negative.       Today's Vitals   05/16/23 1433  BP: 118/80  Pulse: 76  Temp: 98.5 F (36.9 C)  SpO2: 98%  Weight: 190 lb 6.4 oz (86.4 kg)  Height: 5\' 6"  (1.676 m)   Body mass index is 30.73 kg/m.  Wt Readings from Last 3 Encounters:  05/16/23 190 lb 6.4 oz (86.4 kg)  05/05/23 189 lb 9.6 oz (86 kg)  04/26/23 189 lb 12.8 oz (86.1 kg)    The 10-year ASCVD risk score (Arnett DK, et al., 2019) is: 6.5%   Values used to calculate the score:     Age: 6 years     Sex: Female     Is Non-Hispanic African American: Yes     Diabetic: No     Tobacco smoker: No     Systolic Blood Pressure: 118 mmHg     Is BP treated: No     HDL Cholesterol: 65 mg/dL     Total Cholesterol: 206 mg/dL   Objective:  Physical Exam Vitals and nursing note reviewed.  Constitutional:      Appearance: Normal appearance.  HENT:     Head: Normocephalic and atraumatic.     Nose:     Comments: Masked     Mouth/Throat:     Comments: Masked  Eyes:     Extraocular Movements: Extraocular movements intact.  Cardiovascular:     Rate and Rhythm: Normal rate and regular  rhythm.     Heart sounds: Normal heart sounds.  Pulmonary:     Effort: Pulmonary effort is normal.     Breath sounds: Normal breath sounds.  Musculoskeletal:     Cervical back: Normal range of motion.  Skin:    General: Skin is warm.  Neurological:     General: No focal deficit present.     Mental Status: She is alert.  Psychiatric:        Mood and Affect: Mood normal.        Behavior: Behavior normal.         Assessment And Plan:  Thyroid nodule Assessment & Plan: Pathology results reviewed in detail, benign. No Afirma testing performed. I will check thyroid labs as below.   Orders: -     TSH + free T4 -     Thyroglobulin antibody  Coronary artery calcification Assessment & Plan: Chronic, LDL goal is less than 70. Not currently on statin therapy. She prefers to continue with RYR therapy. Will consider Cardiology evaluation for further cardiac risk assessment.    Paresthesia of left arm Assessment & Plan: I will check vitamin B12 level today. If negative, will consider NCS.  Orders: -     Vitamin B12  Immunization due -     Flu Vaccine Trivalent High Dose (Fluad)     Return for WTM ( before November) of this year.  Patient was given opportunity to ask questions. Patient verbalized understanding of the plan and was able to repeat key elements of the plan. All questions were answered to their satisfaction.    I, Gwynneth Aliment, MD, have reviewed all documentation for this visit. The documentation on 05/16/23 for the exam, diagnosis, procedures, and orders are all accurate and complete.   IF YOU HAVE BEEN REFERRED TO A SPECIALIST, IT MAY TAKE 1-2 WEEKS TO SCHEDULE/PROCESS THE REFERRAL. IF YOU HAVE NOT HEARD FROM US/SPECIALIST IN TWO WEEKS, PLEASE GIVE [place] A CALL AT [phone].   THE PATIENT IS ENCOURAGED TO PRACTICE SOCIAL DISTANCING DUE TO THE COVID-19 PANDEMIC.

## 2023-05-16 NOTE — Telephone Encounter (Signed)
Called [facility]. Spoke with tech about lab result from 02/16/23. Tech stated Afirma was not sent out being it was category 2 also benign. Afirma not pushed through.

## 2023-05-17 LAB — THYROGLOBULIN ANTIBODY: Thyroglobulin Antibody: <1 [IU]/mL (ref 0.0–0.9)

## 2023-05-17 LAB — TSH+FREE T4
Free T4: 1.24 ng/dL (ref 0.82–1.77)
TSH: 1.62 u[IU]/mL (ref 0.450–4.500)

## 2023-05-17 LAB — VITAMIN B12: Vitamin B-12: 1425 pg/mL — ABNORMAL HIGH (ref 232–1245)

## 2023-05-19 DIAGNOSIS — M1990 Unspecified osteoarthritis, unspecified site: Secondary | ICD-10-CM | POA: Diagnosis not present

## 2023-05-19 DIAGNOSIS — K219 Gastro-esophageal reflux disease without esophagitis: Secondary | ICD-10-CM | POA: Diagnosis not present

## 2023-05-19 DIAGNOSIS — Z885 Allergy status to narcotic agent status: Secondary | ICD-10-CM | POA: Diagnosis not present

## 2023-05-19 DIAGNOSIS — Z6830 Body mass index (BMI) 30.0-30.9, adult: Secondary | ICD-10-CM | POA: Diagnosis not present

## 2023-05-19 DIAGNOSIS — E669 Obesity, unspecified: Secondary | ICD-10-CM | POA: Diagnosis not present

## 2023-05-19 DIAGNOSIS — I2510 Atherosclerotic heart disease of native coronary artery without angina pectoris: Secondary | ICD-10-CM | POA: Diagnosis not present

## 2023-05-19 DIAGNOSIS — Z882 Allergy status to sulfonamides status: Secondary | ICD-10-CM | POA: Diagnosis not present

## 2023-05-19 DIAGNOSIS — M3500 Sicca syndrome, unspecified: Secondary | ICD-10-CM | POA: Diagnosis not present

## 2023-05-19 DIAGNOSIS — E785 Hyperlipidemia, unspecified: Secondary | ICD-10-CM | POA: Diagnosis not present

## 2023-05-19 DIAGNOSIS — Z809 Family history of malignant neoplasm, unspecified: Secondary | ICD-10-CM | POA: Diagnosis not present

## 2023-06-04 DIAGNOSIS — R202 Paresthesia of skin: Secondary | ICD-10-CM | POA: Insufficient documentation

## 2023-06-04 NOTE — Assessment & Plan Note (Signed)
Pathology results reviewed in detail, benign. No Afirma testing performed. I will check thyroid labs as below.

## 2023-06-04 NOTE — Assessment & Plan Note (Signed)
I will check vitamin B12 level today. If negative, will consider NCS.

## 2023-06-04 NOTE — Assessment & Plan Note (Addendum)
Chronic, LDL goal is less than 70. Not currently on statin therapy. She prefers to continue with RYR therapy. Will consider Cardiology evaluation for further cardiac risk assessment.

## 2023-06-07 ENCOUNTER — Encounter: Payer: Self-pay | Admitting: Internal Medicine

## 2023-06-07 ENCOUNTER — Ambulatory Visit: Payer: Medicare HMO | Admitting: Internal Medicine

## 2023-06-07 VITALS — BP 113/77 | HR 73 | Ht 66.0 in | Wt 192.0 lb

## 2023-06-07 DIAGNOSIS — R058 Other specified cough: Secondary | ICD-10-CM

## 2023-06-07 DIAGNOSIS — E785 Hyperlipidemia, unspecified: Secondary | ICD-10-CM | POA: Insufficient documentation

## 2023-06-07 DIAGNOSIS — M4722 Other spondylosis with radiculopathy, cervical region: Secondary | ICD-10-CM | POA: Insufficient documentation

## 2023-06-07 DIAGNOSIS — M5020 Other cervical disc displacement, unspecified cervical region: Secondary | ICD-10-CM | POA: Insufficient documentation

## 2023-06-07 DIAGNOSIS — R053 Chronic cough: Secondary | ICD-10-CM

## 2023-06-07 DIAGNOSIS — M1990 Unspecified osteoarthritis, unspecified site: Secondary | ICD-10-CM | POA: Insufficient documentation

## 2023-06-07 DIAGNOSIS — M5030 Other cervical disc degeneration, unspecified cervical region: Secondary | ICD-10-CM | POA: Insufficient documentation

## 2023-06-07 DIAGNOSIS — L608 Other nail disorders: Secondary | ICD-10-CM | POA: Insufficient documentation

## 2023-06-07 MED ORDER — FAMOTIDINE 20 MG PO TABS
ORAL_TABLET | ORAL | 11 refills | Status: DC
Start: 2023-06-07 — End: 2023-12-12

## 2023-06-07 MED ORDER — OMEPRAZOLE 40 MG PO CPDR: DELAYED_RELEASE_CAPSULE | ORAL | 11 refills | Status: AC

## 2023-06-07 NOTE — Progress Notes (Signed)
[name] [name], female    DOB: 08-28-1956   MRN: [id number]   Brief patient profile:  41 yobf never smoker with Sjogrens  referred to pulmonary clinic 05/05/2023 by Corliss Skains  for cough  x 2018  with reported onset immediately p started cpap 2017 under Dohlmeir's  direction      History of Present Illness  05/05/2023  Pulmonary/ 1st office eval/Emri Sample  Chief Complaint  Patient presents with   Consult    Productive cough and light green mucus  Sx began when patient started using CPAP.  Around 2018  Dyspnea:  3 miles every other day s stopping fast pace some hills  Cough: much worse at hs / some minimally productive daytime/ slt green tinge   Sleep:  bed is flat with 2 pillows nightly sleep disturbance  with epigastric pain first noted 10 y prior to OV  not better on zegrid  SABA use: none  Rec Continue zegerd but increase Take 30- 60 min before your first and last meals of the day  GERD diet reviewed, bed blocks rec  Please schedule a follow up office visit in 4-6 weeks, call sooner if needed with all medications /inhalers/ solutions in hand  Sinus CT  05/12/23 mild thickening only    06/07/2023  f/u ov/[facility] office/Keela Rubert re: cough  x 2018  daily maint on zegerid - did not bring meds but all besides zergerid bid are otc   Chief Complaint  Patient presents with   Cough   Dyspnea:  Not limited by breathing from desired activities   Cough: some better= not as severe / not as predictable eg at hs/ same color mucus = green in am and yellow rest of day , no change with abx   Sleeping: flat bed / 2 pillows resp cc  SABA use: none  02: none    No obvious day to day or daytime variability or assoc  mucus plugs or hemoptysis or cp or chest tightness, subjective wheeze or overt sinus or hb symptoms.    Also denies any obvious fluctuation of symptoms with weather or environmental changes or other aggravating or alleviating factors except as outlined above   No unusual exposure hx or h/o  childhood pna/ asthma or knowledge of premature birth.  Current Allergies, Complete Past Medical History, Past Surgical History, Family History, and Social History were reviewed in [employer] record.  ROS  The following are not active complaints unless bolded Hoarseness, sore throat, dysphagia, dental problems, itching, sneezing,  nasal congestion or sense of discharge of excess mucus or purulent secretions, ear ache,   fever, chills, sweats, unintended wt loss or wt gain, classically pleuritic or exertional cp,  orthopnea pnd or arm/hand swelling  or leg swelling, presyncope, palpitations, abdominal pain wakes nightly x 10 y, anorexia, nausea, vomiting, diarrhea  or change in bowel habits or change in bladder habits, change in stools or change in urine, dysuria, hematuria,  rash, arthralgias, visual complaints, headache, numbness, weakness or ataxia or problems with walking or coordination,  change in mood or  memory.        Current Meds  Medication Sig   Apoaequorin (PREVAGEN) 10 MG CAPS Take by mouth daily at 6 (six) AM.   Ascorbic Acid (VITAMIN C) POWD as needed (immune support).   Cholecalciferol (VITAMIN D3) 250 MCG (10000 UT) capsule Take 10,000 Units by mouth daily.   Cyanocobalamin (VITAMIN B 12 PO) Take by mouth.   GINSENG CHINESE RED PO Take  by mouth.   Glucosamine-Chondroitin-MSM 375-300-237.5 MG CAPS Take by mouth.   Omega-3 Fatty Acids (FISH OIL PO) Take by mouth 2 (two) times daily.   omeprazole-sodium bicarbonate (ZEGERID) 40-1100 MG capsule Take 30- 60 min before your first and last meals of the day   Red Yeast Rice Extract (RED YEAST RICE PO) Take 2 tablets by mouth daily.          Past Medical History:  Diagnosis Date   Abnormal liver enzymes    Acid reflux    Arthritis    back and neck   Bloating    Bronchitis    Chest pain, unspecified    Diarrhea    Dyspepsia    Esophageal reflux    H/O acute pancreatitis    H/O hiatal hernia     Headache(784.0)    migraines   Hemorrhage of rectum and anus    Hypercholesteremia    Nausea    Sjogren's disease (HCC)    Spinal stenosis, cervical region    Thyroid disease    Thyroid nodule       Objective:     Wt Readings from Last 3 Encounters:  06/07/23 192 lb (87.1 kg)  05/16/23 190 lb 6.4 oz (86.4 kg)  05/05/23 189 lb 9.6 oz (86 kg)      Vital signs reviewed  06/07/2023  - Note at rest 02 sats  96% on RA   General appearance:    amb  min obese by BMI wf nad   HEENT : Oropharynx  clear      Nasal turbinates nl    NECK :  without  apparent JVD/ palpable Nodes/TM    LUNGS: no acc muscle use,  Nl contour chest which is clear to A and P bilaterally with cough early on insp  maneuvers   CV:  RRR  no s3 or murmur or increase in P2, and no edema   ABD:  soft and nontender    MS:  Nl gait/ ext warm without deformities Or obvious joint restrictions  calf tenderness, cyanosis or clubbing    SKIN: warm and dry without lesions    NEURO:  alert, approp, nl sensorium with  no motor or cerebellar deficits apparent.       Assessment

## 2023-06-07 NOTE — Assessment & Plan Note (Signed)
Onset 2018 at bedtime while on cpap, some better off it p 2 years / slt productive green tinted sputum - Sinus CT  05/12/23 mild thickening only  - Allergy screen 06/07/2023 >  Eos 0. /  IgE   -  06/07/2023   1st gen H1 blockers per guidelines  (prev failed 2nd gen)   She has rhinitis with post nasal drainage but has never been on 1st gen H1 and does not recall ent or allergy eval so rec as above and f/u with either ent or allergy depending on findings  Discussed in detail all the  indications, usual  risks and alternatives  relative to the benefits with patient who agrees to proceed with rx and w/u as outlined.             Each maintenance medication was reviewed in detail including emphasizing most importantly the difference between maintenance and prns and under what circumstances the prns are to be triggered using an action plan format where appropriate.  Total time for H and P, chart review, counseling,  and generating customized AVS unique to this office visit / same day charting > 30 min summary f/u ov for    refractory respiratory  symptoms of uncertain etiology

## 2023-06-07 NOTE — Patient Instructions (Signed)
Continue omeprazole 40 mg Take 30- 60 min before your first and take pepcid 20 mg after supper   For drainage / throat tickle try take CHLORPHENIRAMINE  4 mg  ("Allergy Relief" 4mg   at [facility] should be easiest to find in the blue box usually on bottom shelf)  take one every 4 hours as needed - extremely effective and inexpensive over the counter- may cause drowsiness so start with just a dose or two an hour before bedtime and see how you tolerate it before trying in daytime.   Please remember to go to the lab department   for your tests - we will call you with the results when they are available and we can decide whether you need to see an allergist  or ENT ([facility]/ Dr Delford Field)   Pulmonary follow up is as needed

## 2023-06-09 LAB — CBC WITH DIFFERENTIAL/PLATELET
Basophils Absolute: 0 10*3/uL (ref 0.0–0.2)
Basos: 1 %
EOS (ABSOLUTE): 0.2 10*3/uL (ref 0.0–0.4)
Eos: 4 %
Hematocrit: 43.4 % (ref 34.0–46.6)
Hemoglobin: 13.9 g/dL (ref 11.1–15.9)
Immature Grans (Abs): 0 10*3/uL (ref 0.0–0.1)
Immature Granulocytes: 0 %
Lymphocytes Absolute: 1.8 10*3/uL (ref 0.7–3.1)
Lymphs: 29 %
MCH: 28.3 pg (ref 26.6–33.0)
MCHC: 32 g/dL (ref 31.5–35.7)
MCV: 88 fL (ref 79–97)
Monocytes Absolute: 0.4 10*3/uL (ref 0.1–0.9)
Monocytes: 7 %
Neutrophils Absolute: 3.6 10*3/uL (ref 1.4–7.0)
Neutrophils: 59 %
Platelets: 232 10*3/uL (ref 150–450)
RBC: 4.91 x10E6/uL (ref 3.77–5.28)
RDW: 13.5 % (ref 11.7–15.4)
WBC: 6.1 10*3/uL (ref 3.4–10.8)

## 2023-06-09 LAB — IGE: IgE (Immunoglobulin E), Serum: 70 [IU]/mL (ref 6–495)

## 2023-06-13 ENCOUNTER — Ambulatory Visit: Payer: Medicare HMO | Admitting: Internal Medicine

## 2023-06-13 ENCOUNTER — Encounter: Payer: Self-pay | Admitting: Internal Medicine

## 2023-06-13 ENCOUNTER — Emergency Department (HOSPITAL_BASED_OUTPATIENT_CLINIC_OR_DEPARTMENT_OTHER)
Admission: EM | Admit: 2023-06-13 | Discharge: 2023-06-13 | Disposition: A | Discharge status: Home / Self Care | Payer: Medicare HMO | Attending: Emergency Medicine | Admitting: Emergency Medicine

## 2023-06-13 ENCOUNTER — Encounter (HOSPITAL_BASED_OUTPATIENT_CLINIC_OR_DEPARTMENT_OTHER): Payer: Self-pay | Admitting: Urology

## 2023-06-13 ENCOUNTER — Other Ambulatory Visit: Payer: Self-pay

## 2023-06-13 VITALS — BP 124/80 | HR 72 | Temp 98.0°F | Ht 66.0 in | Wt 191.4 lb

## 2023-06-13 DIAGNOSIS — Z0000 Encounter for general adult medical examination without abnormal findings: Secondary | ICD-10-CM

## 2023-06-13 DIAGNOSIS — R102 Pelvic and perineal pain: Secondary | ICD-10-CM | POA: Insufficient documentation

## 2023-06-13 DIAGNOSIS — S46812A Strain of other muscles, fascia and tendons at shoulder and upper arm level, left arm, initial encounter: Secondary | ICD-10-CM | POA: Insufficient documentation

## 2023-06-13 DIAGNOSIS — I10 Essential (primary) hypertension: Secondary | ICD-10-CM | POA: Diagnosis not present

## 2023-06-13 DIAGNOSIS — R202 Paresthesia of skin: Secondary | ICD-10-CM | POA: Diagnosis not present

## 2023-06-13 DIAGNOSIS — M79602 Pain in left arm: Secondary | ICD-10-CM | POA: Diagnosis present

## 2023-06-13 DIAGNOSIS — R519 Headache, unspecified: Secondary | ICD-10-CM | POA: Diagnosis not present

## 2023-06-13 DIAGNOSIS — E2839 Other primary ovarian failure: Secondary | ICD-10-CM | POA: Diagnosis not present

## 2023-06-13 DIAGNOSIS — Y92410 Unspecified street and highway as the place of occurrence of the external cause: Secondary | ICD-10-CM | POA: Insufficient documentation

## 2023-06-13 DIAGNOSIS — H9191 Unspecified hearing loss, right ear: Secondary | ICD-10-CM | POA: Insufficient documentation

## 2023-06-13 DIAGNOSIS — H9192 Unspecified hearing loss, left ear: Secondary | ICD-10-CM

## 2023-06-13 DIAGNOSIS — E041 Nontoxic single thyroid nodule: Secondary | ICD-10-CM

## 2023-06-13 DIAGNOSIS — G4489 Other headache syndrome: Secondary | ICD-10-CM | POA: Diagnosis not present

## 2023-06-13 HISTORY — DX: Encounter for general adult medical examination without abnormal findings: Z00.00

## 2023-06-13 LAB — POCT URINALYSIS DIPSTICK
Bilirubin, UA: NEGATIVE
Glucose, UA: NEGATIVE
Ketones, UA: NEGATIVE
Leukocytes, UA: NEGATIVE
Nitrite, UA: NEGATIVE
Protein, UA: NEGATIVE
Spec Grav, UA: 1.025 (ref 1.010–1.025)
Urobilinogen, UA: 0.2 U/dL
pH, UA: 5.5 (ref 5.0–8.0)

## 2023-06-13 MED ORDER — ACETAMINOPHEN 500 MG PO TABS
1000.0000 mg | ORAL_TABLET | Freq: Once | ORAL | Status: AC
  Administered 2023-06-13: 1000 mg via ORAL
  Filled 2023-06-13: qty 2

## 2023-06-13 MED ORDER — IBUPROFEN 400 MG PO TABS
600.0000 mg | ORAL_TABLET | Freq: Once | ORAL | Status: AC
  Administered 2023-06-13: 600 mg via ORAL
  Filled 2023-06-13: qty 1
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
## 2023-06-13 NOTE — Patient Instructions (Signed)

## 2023-06-13 NOTE — Progress Notes (Signed)
Subjective:    [name] [name] is a 67 y.o. female who presents for a Welcome to Medicare exam. She reports compliance with meds. She denies headaches, chest pain and shortness of breath.     Review of Systems  Constitutional: Negative.   HENT: Negative.    Eyes: Negative.   Respiratory: Negative.    Cardiovascular: Negative.   Gastrointestinal: Negative.   Genitourinary: Negative.   Musculoskeletal: Negative.   Skin: Negative.   Neurological: Negative.   Endo/Heme/Allergies: Negative.   Psychiatric/Behavioral: Negative.       Cardiac Risk Factors include: advanced age (>13men, >28 women);obesity (BMI >30kg/m2)      Objective:    Today's Vitals   06/13/23 1144  BP: 124/80  Pulse: 72  Temp: 98 F (36.7 C)  SpO2: 98%  Weight: 191 lb 6.4 oz (86.8 kg)  Height: 5\' 6"  (1.676 m)  Body mass index is 30.89 kg/m. Wt Readings from Last 3 Encounters:  06/13/23 191 lb (86.6 kg)  06/13/23 191 lb 6.4 oz (86.8 kg)  06/07/23 192 lb (87.1 kg)    Medications Outpatient Encounter Medications as of 06/13/2023  Medication Sig   Apoaequorin (PREVAGEN) 10 MG CAPS Take by mouth daily at 6 (six) AM.   Ascorbic Acid (VITAMIN C) POWD as needed (immune support).   chlorpheniramine (ALLERGY RELIEF) 4 MG tablet Take 4 mg by mouth 2 (two) times daily as needed for allergies.   Cholecalciferol (VITAMIN D3) 250 MCG (10000 UT) capsule Take 10,000 Units by mouth daily.   Cyanocobalamin (VITAMIN B 12 PO) Take by mouth.   famotidine (PEPCID) 20 MG tablet One after supper   GINSENG CHINESE RED PO Take by mouth.   Glucosamine-Chondroitin-MSM 375-300-237.5 MG CAPS Take by mouth.   omeprazole (PRILOSEC) 40 MG capsule Take 30-60 min before first meal of the day   Red Yeast Rice Extract (RED YEAST RICE PO) Take 2 tablets by mouth daily.   No facility-administered encounter medications on file as of 06/13/2023.     History: Past Medical History:  Diagnosis Date   Abnormal liver enzymes    Acid  reflux    Arthritis    back and neck   Bloating    Bronchitis    Chest pain, unspecified    Diarrhea    Dyspepsia    Encounter for Medicare annual wellness exam 06/13/2023   Esophageal reflux    H/O acute pancreatitis    H/O hiatal hernia    Headache(784.0)    migraines   Hemorrhage of rectum and anus    Hypercholesteremia    Nausea    Sjogren's disease (HCC)    Sleep apnea    Spinal stenosis, cervical region    Thyroid disease    Thyroid nodule    Past Surgical History:  Procedure Laterality Date   ABDOMINAL HYSTERECTOMY  2007   total with BSO   BIOPSY THYROID  02/16/2023   CHOLECYSTECTOMY  1985   COLONOSCOPY     ECTOPIC PREGNANCY SURGERY  1984   ESOPHAGOGASTRODUODENOSCOPY     EUS  12/21/2011   Procedure: ESOPHAGEAL ENDOSCOPIC ULTRASOUND (EUS) RADIAL;  Surgeon: Willis Modena, MD;  Location: [facility];  Service: Endoscopy;  Laterality: N/A;   eye cautery     EYE SURGERY     lasik   EYE SURGERY Left    cataract extraction    KNEE ARTHROSCOPY Right 06/11/2020   torn meniscus     Family History  Problem Relation Age of Onset   Colon  cancer Mother    Cancer Mother    Early death Mother    Hearing loss Mother    Early death Father    Pancreatic cancer Brother    Healthy Daughter    Healthy Son    Heart disease Maternal Grandfather    Hypertension Maternal Grandfather    Cancer Brother    Early death Brother    Social History   Occupational History   Not on file  Tobacco Use   Smoking status: Never    Passive exposure: Past   Smokeless tobacco: Never  Vaping Use   Vaping status: Never Used  Substance and Sexual Activity   Alcohol use: Yes    Comment: 1-2 times a year   Drug use: No   Sexual activity: Yes    Birth control/protection: None    Comment: Hysterectomy    Tobacco Counseling Counseling given: Yes   Immunizations and Health Maintenance Immunization History  Administered Date(s) Administered   Fluad Trivalent(High Dose 65+)  05/16/2023   Influenza,inj,Quad PF,6+ Mos 06/24/2022   Influenza-Unspecified 09/01/2022   PFIZER(Purple Top)SARS-COV-2 Vaccination 11/22/2019, 12/13/2019, 07/01/2020, 03/26/2021   Tdap 07/13/2017   Zoster Recombinant(Shingrix) 02/15/2022, 05/10/2022   Health Maintenance Due  Topic Date Due   DEXA SCAN  08/31/2022   COVID-19 Vaccine (5 - 2023-24 season) 05/07/2023    Activities of Daily Living    06/13/2023   12:02 PM  In your present state of health, do you have any difficulty performing the following activities:  Hearing? 1  Comment right ear hearing trouble  Vision? 0  Difficulty concentrating or making decisions? 0  Walking or climbing stairs? 0  Dressing or bathing? 0  Doing errands, shopping? 0  Preparing Food and eating ? N  Using the Toilet? N  In the past six months, have you accidently leaked urine? N  Do you have problems with loss of bowel control? N  Managing your Medications? N  Managing your Finances? N  Housekeeping or managing your Housekeeping? N    Physical Exam   Physical Exam Vitals and nursing note reviewed.  Constitutional:      Appearance: Normal appearance.  HENT:     Head: Normocephalic and atraumatic.     Right Ear: Tympanic membrane, ear canal and external ear normal.     Left Ear: Tympanic membrane, ear canal and external ear normal.     Nose: Nose normal.     Mouth/Throat:     Mouth: Mucous membranes are moist.     Pharynx: Oropharynx is clear.  Eyes:     Extraocular Movements: Extraocular movements intact.     Conjunctiva/sclera: Conjunctivae normal.     Pupils: Pupils are equal, round, and reactive to light.  Cardiovascular:     Rate and Rhythm: Normal rate and regular rhythm.     Pulses: Normal pulses.     Heart sounds: Normal heart sounds.  Pulmonary:     Effort: Pulmonary effort is normal.     Breath sounds: Normal breath sounds.  Chest:  Breasts:    Tanner Score is 5.     Right: Normal.     Left: Normal.  Abdominal:      General: Bowel sounds are normal.     Palpations: Abdomen is soft.  Genitourinary:    Comments: deferred Musculoskeletal:        General: Normal range of motion.     Cervical back: Normal range of motion and neck supple.  Skin:    General: Skin  is warm and dry.  Neurological:     General: No focal deficit present.     Mental Status: She is alert and oriented to person, place, and time.  Psychiatric:        Mood and Affect: Mood normal.        Behavior: Behavior normal.    (optional), or other factors deemed appropriate based on the beneficiary's medical and social history and current clinical standards.   Advanced Directives: handout given to [name]    EKG:  normal EKG, normal sinus rhythm    Assessment:    This is a routine wellness examination for this [name] .   Vision/Hearing screen Hearing Screening   500Hz  1000Hz  2000Hz  4000Hz   Right ear Pass Pass Pass Pass  Left ear Pass Pass Fail Fail   Vision Screening   Right eye Left eye Both eyes  Without correction 20/50 20/50 20/30   With correction        Goals       Diet & Exercise (pt-stated)      [name] reports she wants to keep up with exercising regularly. Along with keeping a healthy diet.        Depression Screen    06/13/2023   12:00 PM 05/16/2023    2:35 PM 02/15/2022    3:29 PM  PHQ 2/9 Scores  PHQ - 2 Score 0 0 0  PHQ- 9 Score 0 0      Fall Risk    06/13/2023   12:00 PM  Fall Risk   Falls in the past year? 0  Number falls in past yr: 0  Injury with Fall? 0  Risk for fall due to : No Fall Risks  Follow up Falls evaluation completed    Cognitive Function:        06/13/2023   12:07 PM  6CIT Screen  What Year? 0 points  What month? 0 points  What time? 3 points  Count back from 20 0 points  Months in reverse 0 points  Repeat phrase 0 points  Total Score 3 points    [name] Care Team: Dorothyann Peng, MD as PCP - General (Internal Medicine) Christell Constant, MD as PCP -  Cardiology (Cardiology)     Plan:   Encounter for Medicare annual wellness exam Assessment & Plan: The annual wellness visit was performed including discussion of advanced directives, assessment of functional status and cognitive function. EKG performed, NSR w/o acute changes. A full exam was also performed. She will rto in one year for AWV with [facility] Advisor.  A full exam was performed.  Importance of monthly self breast exams was discussed with the [name].  She is advised to get 30-45 minutes of regular exercise, no less than four to five days per week. Both weight-bearing and aerobic exercises are recommended.  She is advised to follow a healthy diet with at least six fruits/veggies per day, decrease intake of red meat and other saturated fats and to increase fish intake to twice weekly.  Meats/fish should not be fried -- baked, boiled or broiled is preferable. It is also important to cut back on your sugar intake.  Be sure to read labels - try to avoid anything with added sugar, high fructose corn syrup or other sweeteners.  If you must use a sweetener, you can try stevia or monkfruit.  It is also important to avoid artificially sweetened foods/beverages and diet drinks. Lastly, wear SPF 50 sunscreen on exposed skin and when in direct sunlight  for an extended period of time.  Be sure to avoid fast food restaurants and aim for at least 60 ounces of water daily.        Orders: -     POCT urinalysis dipstick -     EKG 12-Lead  Thyroid nodule Assessment & Plan: Pathology results significant for benign follicular nodule (Bethesda II). Will refer her to Endocrinology for further evaluation.   Orders: -     Ambulatory referral to Endocrinology  Hearing loss of left ear, unspecified hearing loss type -     Ambulatory referral to Audiology  Estrogen deficiency -     DG Bone Density; Future  Pelvic pain Assessment & Plan: She is also followed by GYN, will seek further evaluation. Previous CT  abd/pelvis from 2022 reviewed. Results significant for subcentimeter liver hypodensities too small to characterize, but likely cysts.  Bilateral renal cysts unchanged.  May not repeat imaging if her symptoms persist.       I have personally reviewed and noted the following in the [name]'s chart:   Medical and social history Use of alcohol, tobacco or illicit drugs  Current medications and supplements Functional ability and status Nutritional status Physical activity Advanced directives List of other physicians Hospitalizations, surgeries, and ER visits in previous 12 months Vitals Screenings to include cognitive, depression, and falls Referrals and appointments  In addition, I have reviewed and discussed with [name] certain preventive protocols, quality metrics, and best practice recommendations. A written personalized care plan for preventive services as well as general preventive health recommendations were provided to [name].     Gwynneth Aliment, MD 06/25/2023
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
## 2023-06-16 ENCOUNTER — Institutional Professional Consult (permissible substitution): Payer: Medicare HMO | Admitting: Pulmonary Disease

## 2023-06-25 NOTE — Assessment & Plan Note (Addendum)
She is also followed by GYN, will seek further evaluation. Previous CT abd/pelvis from 2022 reviewed. Results significant for subcentimeter liver hypodensities too small to characterize, but likely cysts.  Bilateral renal cysts unchanged.  May not repeat imaging if her symptoms persist.

## 2023-06-25 NOTE — Assessment & Plan Note (Signed)
Pathology results significant for benign follicular nodule (Bethesda II). Will refer her to Endocrinology for further evaluation.

## 2023-06-25 NOTE — Assessment & Plan Note (Signed)
The annual wellness visit was performed including discussion of advanced directives, assessment of functional status and cognitive function. EKG performed, NSR w/o acute changes. A full exam was also performed. She will rto in one year for AWV with [facility] Advisor.  A full exam was performed.  Importance of monthly self breast exams was discussed with the patient.  She is advised to get 30-45 minutes of regular exercise, no less than four to five days per week. Both weight-bearing and aerobic exercises are recommended.  She is advised to follow a healthy diet with at least six fruits/veggies per day, decrease intake of red meat and other saturated fats and to increase fish intake to twice weekly.  Meats/fish should not be fried -- baked, boiled or broiled is preferable. It is also important to cut back on your sugar intake.  Be sure to read labels - try to avoid anything with added sugar, high fructose corn syrup or other sweeteners.  If you must use a sweetener, you can try stevia or monkfruit.  It is also important to avoid artificially sweetened foods/beverages and diet drinks. Lastly, wear SPF 50 sunscreen on exposed skin and when in direct sunlight for an extended period of time.  Be sure to avoid fast food restaurants and aim for at least 60 ounces of water daily.

## 2023-07-12 ENCOUNTER — Telehealth: Payer: Self-pay

## 2023-07-12 NOTE — Telephone Encounter (Signed)
Transition Care Management Unsuccessful Follow-up Telephone Call  Date of discharge and from where:  06/13/2023 [facility]  Attempts:  1st Attempt  Reason for unsuccessful TCM follow-up call:  Left voice message  Ailyne Pawley [facility]  [facility], [facility] Guide Direct Dial: [phone]  Website: [email or web address]

## 2023-07-14 ENCOUNTER — Telehealth: Payer: Self-pay
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
## 2023-07-17 ENCOUNTER — Other Ambulatory Visit (INDEPENDENT_AMBULATORY_CARE_PROVIDER_SITE_OTHER): Payer: Medicare HMO

## 2023-07-17 DIAGNOSIS — R8290 Unspecified abnormal findings in urine: Secondary | ICD-10-CM

## 2023-07-17 LAB — POCT URINALYSIS DIPSTICK
Bilirubin, UA: NEGATIVE
Glucose, UA: NEGATIVE
Ketones, UA: NEGATIVE
Leukocytes, UA: NEGATIVE
Nitrite, UA: NEGATIVE
Protein, UA: NEGATIVE
Spec Grav, UA: 1.02 (ref 1.010–1.025)
Urobilinogen, UA: 0.2 U/dL
pH, UA: 5.5 (ref 5.0–8.0)

## 2023-07-18 ENCOUNTER — Encounter: Payer: Self-pay | Admitting: Internal Medicine

## 2023-07-31 DIAGNOSIS — K641 Second degree hemorrhoids: Secondary | ICD-10-CM | POA: Diagnosis not present

## 2023-08-09 DIAGNOSIS — R102 Pelvic and perineal pain: Secondary | ICD-10-CM | POA: Diagnosis not present

## 2023-08-10 ENCOUNTER — Other Ambulatory Visit: Payer: Self-pay | Admitting: Obstetrics and Gynecology

## 2023-08-10 ENCOUNTER — Encounter: Payer: Self-pay | Admitting: Obstetrics and Gynecology

## 2023-08-10 DIAGNOSIS — H04123 Dry eye syndrome of bilateral lacrimal glands: Secondary | ICD-10-CM | POA: Diagnosis not present

## 2023-08-10 DIAGNOSIS — R102 Pelvic and perineal pain: Secondary | ICD-10-CM

## 2023-08-10 DIAGNOSIS — M3501 Sicca syndrome with keratoconjunctivitis: Secondary | ICD-10-CM | POA: Diagnosis not present

## 2023-08-10 DIAGNOSIS — H43393 Other vitreous opacities, bilateral: Secondary | ICD-10-CM | POA: Diagnosis not present

## 2023-08-10 DIAGNOSIS — H53143 Visual discomfort, bilateral: Secondary | ICD-10-CM | POA: Diagnosis not present

## 2023-08-14 ENCOUNTER — Encounter: Payer: Self-pay | Admitting: Obstetrics and Gynecology

## 2023-08-17 ENCOUNTER — Encounter: Payer: Self-pay | Admitting: Internal Medicine

## 2023-08-17 ENCOUNTER — Ambulatory Visit
Admission: RE | Admit: 2023-08-17 | Discharge: 2023-08-17 | Disposition: A | Discharge status: Home / Self Care | Payer: Medicare HMO | Source: Ambulatory Visit | Attending: Obstetrics and Gynecology | Admitting: Obstetrics and Gynecology

## 2023-08-17 DIAGNOSIS — R102 Pelvic and perineal pain: Secondary | ICD-10-CM

## 2023-08-17 DIAGNOSIS — I2510 Atherosclerotic heart disease of native coronary artery without angina pectoris: Secondary | ICD-10-CM | POA: Diagnosis not present

## 2023-08-17 DIAGNOSIS — G4733 Obstructive sleep apnea (adult) (pediatric): Secondary | ICD-10-CM | POA: Diagnosis not present

## 2023-08-17 DIAGNOSIS — M1990 Unspecified osteoarthritis, unspecified site: Secondary | ICD-10-CM | POA: Diagnosis not present

## 2023-08-17 MED ORDER — IOPAMIDOL (ISOVUE-300) INJECTION 61%
100.0000 mL | Freq: Once | INTRAVENOUS | Status: AC | PRN
  Administered 2023-08-17: 100 mL via INTRAVENOUS

## 2023-09-12 NOTE — Progress Notes (Unsigned)
Office Visit Note  Patient: [name] [name]             Date of Birth: 10/10/1955           MRN: [id number]             PCP: Dorothyann Peng, MD Referring: Dorothyann Peng, MD Visit Date: 09/26/2023 Occupation: @GUAROCC @  Subjective:  Dry eyes  History of Present Illness: [name] [name] is a 68 y.o. female with history of sjogren's syndrome and osteoarthritis.  She is not currently taking any immunosuppressive agents.  Patient continues to have chronic sicca symptoms but overall her symptoms have been manageable and unchanged.  She on occasion uses Systane eyedrops but overall has not needed to use any over-the-counter products for symptomatic relief.  Patient denies any swollen lymph nodes or recent salivary stones.  She continues to see the dentist every 6 months and denies any recent dental caries.  She continues to see ophthalmology on a yearly basis.  Patient reports that her energy level has been stable.  She denies any night sweats.  She denies any new medical conditions.  She denies any joint swelling at this time.  She denies any recurrent infections.    Activities of Daily Living:  Patient reports morning stiffness for 5 minutes.   Patient Denies nocturnal pain.  Difficulty dressing/grooming: Denies Difficulty climbing stairs: Denies Difficulty getting out of chair: Denies Difficulty using hands for taps, buttons, cutlery, and/or writing: Denies  Review of Systems  Constitutional:  Negative for fatigue.  HENT:  Negative for mouth sores, mouth dryness and nose dryness.   Eyes:  Positive for dryness. Negative for photophobia and pain.  Respiratory:  Positive for cough. Negative for shortness of breath and difficulty breathing.   Cardiovascular:  Positive for swelling in legs/feet. Negative for chest pain and palpitations.  Gastrointestinal:  Negative for blood in stool, constipation and diarrhea.  Endocrine: Negative for increased urination.  Genitourinary:  Negative for  painful urination and involuntary urination.  Musculoskeletal:  Positive for morning stiffness. Negative for joint pain, gait problem, joint pain, joint swelling, myalgias, muscle weakness, muscle tenderness and myalgias.  Skin:  Positive for color change. Negative for rash, hair loss and sensitivity to sunlight.  Allergic/Immunologic: Negative for susceptible to infections.  Neurological:  Positive for dizziness. Negative for numbness and headaches.  Hematological:  Negative for swollen glands.  Psychiatric/Behavioral:  Positive for sleep disturbance. Negative for depressed mood. The patient is not nervous/anxious.     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   Osteoarthritis 06/07/2023   Other cervical disc displacement, unspecified cervical region 06/07/2023   Other cervical disc degeneration, unspecified cervical region 06/07/2023   Other spondylosis with radiculopathy, cervical region 06/07/2023   Paresthesia of left arm 06/04/2023   Upper airway cough syndrome 05/05/2023   Coronary artery calcification 11/03/2022   Sjogren's syndrome (HCC) 07/01/2022   Acute pain of left shoulder 04/23/2022   Soft tissue mass 04/23/2022   Postnasal drip 04/23/2022   Class 1 obesity due to excess calories without serious comorbidity with body mass index (BMI) of 31.0 to 31.9 in adult 04/23/2022   Dysfunction of both eustachian tubes 05/24/2021   Primary osteoarthritis of both knees 07/30/2018   Primary osteoarthritis of  both hands 07/30/2018   Foot pain 05/15/2018   OSA on CPAP 02/23/2017   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   Snoring 03/25/2015   Hypersomnia with sleep apnea 03/25/2015   Body mass index (BMI) of 30.0-30.9 in adult 02/20/2015    Bilateral dry eyes 10/30/2013   MGD (meibomian gland disease) 10/30/2013   Nuclear sclerosis of both eyes 10/30/2013   Multinodular goiter (nontoxic) 02/08/2012   Acute pancreatitis 10/26/2011   Nausea and vomiting in adult 10/26/2011   Dehydration 10/26/2011   Gastroenteritis 10/26/2011   Hypokalemia 10/26/2011   Elevated liver function tests 10/26/2011   Acid reflux    Thyroid nodule    Abdominal bloating 07/08/2011    Past Medical History:  Diagnosis Date   Abnormal liver enzymes    Acid reflux    Arthritis    back and neck   Bloating    Bronchitis    Chest pain, unspecified    Diarrhea    Dyspepsia    Encounter for Medicare annual wellness exam 06/13/2023   Esophageal reflux    H/O acute pancreatitis    H/O hiatal hernia    Headache(784.0)    migraines   Hemorrhage of rectum and anus    Hypercholesteremia    Nausea    Sjogren's disease (HCC)    Sleep apnea    Spinal stenosis, cervical region    Thyroid disease    Thyroid nodule     Family History  Problem Relation Age of Onset   Colon cancer Mother    Cancer Mother    Early death Mother    Hearing loss Mother    Early death Father    Pancreatic cancer Brother    Healthy Daughter    Healthy Son    Heart disease Maternal Grandfather    Hypertension Maternal Grandfather    Cancer Brother    Early death Brother    Past Surgical History:  Procedure Laterality Date   ABDOMINAL HYSTERECTOMY  2007   total with BSO   BIOPSY THYROID  02/16/2023   capsulorhexis Left 09/13/2023   (eye procedure)   CHOLECYSTECTOMY  1985   COLONOSCOPY     ECTOPIC PREGNANCY SURGERY  1984   ESOPHAGOGASTRODUODENOSCOPY     EUS  12/21/2011   Procedure: ESOPHAGEAL ENDOSCOPIC ULTRASOUND (EUS) RADIAL;  Surgeon: Willis Modena, MD;  Location: [facility];  Service: Endoscopy;  Laterality: N/A;   eye cautery     EYE SURGERY     lasik   EYE SURGERY Left    cataract extraction    KNEE ARTHROSCOPY Right 06/11/2020   torn meniscus     Social History   Social History Narrative   Married.   Drinks occasional sweet tea.   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   Immunization History  Administered Date(s) Administered   Fluad Trivalent(High Dose 65+) 05/16/2023   Influenza,inj,Quad PF,6+ Mos 06/24/2022   Influenza-Unspecified 09/01/2022   PFIZER(Purple Top)SARS-COV-2 Vaccination 11/22/2019, 12/13/2019, 07/01/2020, 03/26/2021   Tdap 07/13/2017   Zoster Recombinant(Shingrix) 02/15/2022, 05/10/2022     Objective: Vital Signs: BP 122/81 (BP Location: Left Arm, Patient Position: Sitting, Cuff Size: Normal)   Pulse 65   Resp 15   Ht 5\' 6"  (1.676 m)   Wt 191 lb 3.2 oz (86.7 kg)   BMI 30.86 kg/m    Physical Exam Vitals and nursing note reviewed.  Constitutional:      Appearance: She is well-developed.  HENT:     Head: Normocephalic and atraumatic.  Eyes:     Conjunctiva/sclera: Conjunctivae normal.  Cardiovascular:     Rate and Rhythm: Normal rate and regular rhythm.     Heart sounds: Normal heart sounds.  Pulmonary:     Effort: Pulmonary effort is normal.     Breath sounds: Normal breath sounds.  Abdominal:     General: Bowel sounds are normal.     Palpations: Abdomen is soft.  Musculoskeletal:     Cervical back: Normal range of motion.  Lymphadenopathy:     Cervical: No cervical adenopathy.  Skin:    General: Skin is warm and dry.     Capillary Refill: Capillary refill takes less than 2 seconds.  Neurological:     Mental Status: She is alert and oriented to person, place, and time.  Psychiatric:        Behavior: Behavior normal.      Musculoskeletal Exam: C-spine, thoracic spine, and lumbar spine good ROM.  Shoulder joints, elbow joints, wrist joints, MCPs, PIPs, and DIPs good ROM with no synovitis.  Complete fist formation bilaterally.  Hip joints have good ROM with no groin pain.  Knee joints have good ROM with no warmth or effusion.  Ankle joints have good ROM  with no tenderness or joint swelling.   CDAI Exam: CDAI Score: -- Patient Global: --; Provider Global: -- Swollen: --; Tender: -- Joint Exam 09/26/2023   No joint exam has been documented for this visit   There is currently no information documented on the homunculus. Go to the Rheumatology activity and complete the homunculus joint exam.  Investigation: No additional findings.  Imaging: No results found.   Recent Labs: Lab Results  Component Value Date   WBC 6.1 06/07/2023   HGB 13.9 06/07/2023   PLT 232 06/07/2023   NA 140 04/26/2023   K 5.0 04/26/2023   CL 103 04/26/2023   CO2 25 04/26/2023   GLUCOSE 87 04/26/2023   BUN 16 04/26/2023   CREATININE 0.85 04/26/2023   BILITOT 0.9 04/26/2023   ALKPHOS 64 01/09/2021   AST 19 04/26/2023   ALT 23 04/26/2023   PROT 7.2 04/26/2023   ALBUMIN 3.6 01/09/2021   CALCIUM 10.2 04/26/2023   GFRAA 88 09/29/2020    Speciality Comments: PLQ eye exam: 08/27/2019 normal. [facility]. Follow up in 6 months. PLQ started 07/2019  Procedures:  No procedures performed Allergies: Morphine and codeine, Other, and Sulfa antibiotics   Assessment / Plan:     Visit Diagnoses: Sjogren's syndrome with other organ involvement (HCC) - ANA 1: 80 speckled, positive Ro antibody: Patient continues to have chronic sicca symptoms but overall her symptoms have been manageable and unchanged.  On occasion she uses Systane eyedrops for dry eyes but has otherwise not needed to use any over-the-counter products.  Patient continues to see the dentist every 6 months and has been seeing ophthalmology on a yearly basis.  No cervical lymphadenopathy was noted today.  She has not had any night sweats and her energy level has been stable.  Discussed the increased risk for developing lymphoma in patients with Sjogren's syndrome.  Plan to obtain SPEP, ESR, and CBC with differential today. No new or worsening pulmonary symptoms. No clinical features of inflammatory  arthritis noted at this time.  No synovitis was noted on exam.  She does not require immunosuppressive therapy at this time. Lab work from 04/26/23 was reviewed today in the office: ANA 1:40 nuclear fine speckled, Ro 7.7, ESR WNL, complements WNL, and dsDNA is negative.  The following lab work will be updated today.  She was advised to notify us if she develops any new or worsening symptoms.  She will follow-up in the office in 5 months or sooner if needed. - Plan: C3 and C4, Sedimentation rate, CBC with Differential/Platelet, COMPLETE METABOLIC PANEL WITH GFR, Urinalysis, Routine w reflex microscopic, Rheumatoid factor, Serum protein electrophoresis with reflex  High risk medication use: She is not currently taking immunosuppressive agents.  Previously prescribed Plaquenil for about 3 months. She does not require immunosuppressive therapy at this time.  CBC and CMP were updated on 04/26/2023.  Orders for CBC and CMP released today.  Antineutrophil cytoplasmic antibody (ANCA) positive - repeat ANCA was negative.  No clinical features of vasculitis. Sed rate has been normal.  Primary osteoarthritis of both hands: She has some PIP and DIP thickening consistent with osteoarthritis of both hands.  No tenderness or inflammation noted.  Complete fist formation bilaterally.  Effusion, right knee: No recurrence.  No effusion noted.  Primary osteoarthritis of both knees - Dr. Kennith Gain has good range of motion of both knee joints on examination today.  No warmth or effusion noted.  Other medical conditions are listed as follows:   History of sleep apnea  History of thyroid disease - Thyroid biopsy of 02/16/2023 was negative/benign.  Other fatigue: Energy level has been stable.   Chronic cough: No cough or new pulmonary symptoms recently.   Orders: Orders Placed This Encounter  Procedures   C3 and C4   Sedimentation rate   CBC with Differential/Platelet   COMPLETE METABOLIC PANEL WITH GFR    Urinalysis, Routine w reflex microscopic   Rheumatoid factor   Serum protein electrophoresis with reflex   No orders of the defined types were placed in this encounter.     Follow-Up Instructions: Return in about 5 months (around 02/24/2024) for Sjogren's syndrome, Osteoarthritis.   Gearldine Bienenstock, PA-C  Note - This record has been created using Dragon software.  Chart creation errors have been sought, but may not always  have been located. Such creation errors do not reflect on  the standard of medical care.

## 2023-09-13 DIAGNOSIS — H2511 Age-related nuclear cataract, right eye: Secondary | ICD-10-CM | POA: Diagnosis not present

## 2023-09-13 DIAGNOSIS — H26492 Other secondary cataract, left eye: Secondary | ICD-10-CM | POA: Diagnosis not present

## 2023-09-13 HISTORY — PX: OTHER SURGICAL HISTORY: SHX169

## 2023-09-20 ENCOUNTER — Other Ambulatory Visit: Payer: Self-pay

## 2023-09-20 DIAGNOSIS — E041 Nontoxic single thyroid nodule: Secondary | ICD-10-CM

## 2023-09-26 ENCOUNTER — Encounter: Payer: Self-pay | Admitting: Physician Assistant

## 2023-09-26 ENCOUNTER — Ambulatory Visit: Payer: Medicare HMO | Attending: Physician Assistant | Admitting: Physician Assistant

## 2023-09-26 VITALS — BP 122/81 | HR 65 | Resp 15 | Ht 66.0 in | Wt 191.2 lb

## 2023-09-26 DIAGNOSIS — M170 Bilateral primary osteoarthritis of knee: Secondary | ICD-10-CM

## 2023-09-26 DIAGNOSIS — Z8639 Personal history of other endocrine, nutritional and metabolic disease: Secondary | ICD-10-CM | POA: Diagnosis not present

## 2023-09-26 DIAGNOSIS — R053 Chronic cough: Secondary | ICD-10-CM | POA: Diagnosis not present

## 2023-09-26 DIAGNOSIS — M19041 Primary osteoarthritis, right hand: Secondary | ICD-10-CM

## 2023-09-26 DIAGNOSIS — M25461 Effusion, right knee: Secondary | ICD-10-CM | POA: Diagnosis not present

## 2023-09-26 DIAGNOSIS — M3509 Sicca syndrome with other organ involvement: Secondary | ICD-10-CM

## 2023-09-26 DIAGNOSIS — M19042 Primary osteoarthritis, left hand: Secondary | ICD-10-CM | POA: Diagnosis not present

## 2023-09-26 DIAGNOSIS — R768 Other specified abnormal immunological findings in serum: Secondary | ICD-10-CM

## 2023-09-26 DIAGNOSIS — R5383 Other fatigue: Secondary | ICD-10-CM | POA: Diagnosis not present

## 2023-09-26 DIAGNOSIS — Z79899 Other long term (current) drug therapy: Secondary | ICD-10-CM

## 2023-09-26 DIAGNOSIS — Z8669 Personal history of other diseases of the nervous system and sense organs: Secondary | ICD-10-CM

## 2023-09-27 NOTE — Progress Notes (Signed)
CBC and CMP WNL  ESR WNL  RF negative  UA normal  Complements WNL

## 2023-09-28 ENCOUNTER — Other Ambulatory Visit: Payer: Medicare HMO

## 2023-09-28 DIAGNOSIS — E041 Nontoxic single thyroid nodule: Secondary | ICD-10-CM | POA: Diagnosis not present

## 2023-09-29 LAB — URINALYSIS, ROUTINE W REFLEX MICROSCOPIC
Bilirubin Urine: NEGATIVE
Glucose, UA: NEGATIVE
Hgb urine dipstick: NEGATIVE
Ketones, ur: NEGATIVE
Leukocytes,Ua: NEGATIVE
Nitrite: NEGATIVE
Protein, ur: NEGATIVE
Specific Gravity, Urine: 1.006 (ref 1.001–1.035)
pH: 7 (ref 5.0–8.0)

## 2023-09-29 LAB — COMPLETE METABOLIC PANEL WITH GFR
AG Ratio: 2.1 (calc) (ref 1.0–2.5)
ALT: 15 U/L (ref 6–29)
AST: 17 U/L (ref 10–35)
Albumin: 4.8 g/dL (ref 3.6–5.1)
Alkaline phosphatase (APISO): 87 U/L (ref 37–153)
BUN: 11 mg/dL (ref 7–25)
CO2: 28 mmol/L (ref 20–32)
Calcium: 9.8 mg/dL (ref 8.6–10.4)
Chloride: 105 mmol/L (ref 98–110)
Creat: 0.84 mg/dL (ref 0.50–1.05)
Globulin: 2.3 g/dL (ref 1.9–3.7)
Glucose, Bld: 87 mg/dL (ref 65–99)
Potassium: 4.5 mmol/L (ref 3.5–5.3)
Sodium: 141 mmol/L (ref 135–146)
Total Bilirubin: 1.1 mg/dL (ref 0.2–1.2)
Total Protein: 7.1 g/dL (ref 6.1–8.1)
eGFR: 77 mL/min/{1.73_m2} (ref >=60)

## 2023-09-29 LAB — PROTEIN ELECTROPHORESIS, SERUM, WITH REFLEX
Albumin ELP: 4.5 g/dL (ref 3.8–4.8)
Alpha 1: 0.4 g/dL — ABNORMAL HIGH (ref 0.2–0.3)
Alpha 2: 0.5 g/dL (ref 0.5–0.9)
Beta 2: 0.4 g/dL (ref 0.2–0.5)
Beta Globulin: 0.4 g/dL (ref 0.4–0.6)
Gamma Globulin: 0.8 g/dL (ref 0.8–1.7)
Total Protein: 7 g/dL (ref 6.1–8.1)

## 2023-09-29 LAB — CBC WITH DIFFERENTIAL/PLATELET
Absolute Lymphocytes: 1542 {cells}/uL (ref 850–3900)
Absolute Monocytes: 318 {cells}/uL (ref 200–950)
Basophils Absolute: 21 {cells}/uL (ref 0–200)
Basophils Relative: 0.4 %
Eosinophils Absolute: 180 {cells}/uL (ref 15–500)
Eosinophils Relative: 3.4 %
HCT: 42 % (ref 35.0–45.0)
Hemoglobin: 13.9 g/dL (ref 11.7–15.5)
MCH: 28.1 pg (ref 27.0–33.0)
MCHC: 33.1 g/dL (ref 32.0–36.0)
MCV: 84.8 fL (ref 80.0–100.0)
MPV: 10.9 fL (ref 7.5–12.5)
Monocytes Relative: 6 %
Neutro Abs: 3238 {cells}/uL (ref 1500–7800)
Neutrophils Relative %: 61.1 %
Platelets: 240 10*3/uL (ref 140–400)
RBC: 4.95 10*6/uL (ref 3.80–5.10)
RDW: 13 % (ref 11.0–15.0)
Total Lymphocyte: 29.1 %
WBC: 5.3 10*3/uL (ref 3.8–10.8)

## 2023-09-29 LAB — RHEUMATOID FACTOR: Rheumatoid fact SerPl-aCnc: <10 [IU]/mL (ref <14)

## 2023-09-29 LAB — C3 AND C4
C3 Complement: 112 mg/dL (ref 83–193)
C4 Complement: 35 mg/dL (ref 15–57)

## 2023-09-29 LAB — SEDIMENTATION RATE: Sed Rate: 6 mm/h (ref 0–30)

## 2023-09-29 LAB — TSH: TSH: 0.98 m[IU]/L (ref 0.40–4.50)

## 2023-09-29 LAB — T4, FREE: Free T4: 1.3 ng/dL (ref 0.8–1.8)

## 2023-09-29 NOTE — Progress Notes (Signed)
SPEP  normal.

## 2023-10-04 ENCOUNTER — Ambulatory Visit: Payer: Medicare HMO | Admitting: "Endocrinology

## 2023-10-30 ENCOUNTER — Encounter: Payer: Self-pay | Admitting: Internal Medicine

## 2023-11-08 DIAGNOSIS — K080 Exfoliation of teeth due to systemic causes: Secondary | ICD-10-CM | POA: Diagnosis not present

## 2023-11-20 DIAGNOSIS — Z6830 Body mass index (BMI) 30.0-30.9, adult: Secondary | ICD-10-CM | POA: Diagnosis not present

## 2023-11-20 DIAGNOSIS — N958 Other specified menopausal and perimenopausal disorders: Secondary | ICD-10-CM | POA: Diagnosis not present

## 2023-11-20 DIAGNOSIS — Z01419 Encounter for gynecological examination (general) (routine) without abnormal findings: Secondary | ICD-10-CM | POA: Diagnosis not present

## 2023-11-20 LAB — HM DEXA SCAN: HM Dexa Scan: NORMAL

## 2023-11-23 ENCOUNTER — Encounter: Payer: Self-pay | Admitting: "Endocrinology

## 2023-11-23 ENCOUNTER — Other Ambulatory Visit: Payer: Self-pay | Admitting: Obstetrics and Gynecology

## 2023-11-23 ENCOUNTER — Encounter: Payer: Self-pay | Admitting: Internal Medicine

## 2023-11-23 ENCOUNTER — Ambulatory Visit: Payer: Medicare HMO | Admitting: "Endocrinology

## 2023-11-23 VITALS — BP 126/80 | HR 73 | Ht 66.0 in | Wt 192.0 lb

## 2023-11-23 DIAGNOSIS — Z1231 Encounter for screening mammogram for malignant neoplasm of breast: Secondary | ICD-10-CM

## 2023-11-23 DIAGNOSIS — E042 Nontoxic multinodular goiter: Secondary | ICD-10-CM

## 2023-11-23 NOTE — Progress Notes (Signed)
 Outpatient Endocrinology Note [name] [place], MD  11/23/23   [name] [name] 05/20/1956 [id number]  Referring Provider: Dorothyann Peng, MD Primary Care Provider: Dorothyann Peng, MD Subjective  No chief complaint on file.   Assessment & Plan  Diagnoses and all orders for this visit:  Multinodular goiter -     US THYROID; Future -     US THYROID    [name] [name] is currently not taking any thyroid medication.. Patient is currently biochemically euthyroid.  Educated on thyroid axis.  No indication for any thyroid medication  History of multinodular goiter S/p FNA in 2007-results not found (?3.1 cm x 2.0 x 2.1 cm previously biopsied in 08/02/2006?) S/p FNA in 2024 Left; Mid TR3 5.8 cm x 3.8 x 3.7 cm  (nodule 2 in 2023 U/S) Last thryodi U/S in 06/2022 Ordered repeat thyroid U/S   I have reviewed current medications, nurse's notes, allergies, vital signs, past medical and surgical history, family medical history, and social history for this encounter. Counseled patient on symptoms, examination findings, lab findings, imaging results, treatment decisions and monitoring and prognosis. The patient understood the recommendations and agrees with the treatment plan. All questions regarding treatment plan were fully answered.   Return in about 4 weeks (around 12/21/2023) for to discuss U/S results .   [name] Mullens, MD  11/23/23   I have reviewed current medications, nurse's notes, allergies, vital signs, past medical and surgical history, family medical history, and social history for this encounter. Counseled patient on symptoms, examination findings, lab findings, imaging results, treatment decisions and monitoring and prognosis. The patient understood the recommendations and agrees with the treatment plan. All questions regarding treatment plan were fully answered.   History of Present Illness [name] [name] is a 68 y.o. year old female who presents to our clinic with  multinodular goiter diagnosed around 2007.    Never been on thyroid medication   Symptoms suggestive of HYPOTHYROIDISM:  fatigue No weight gain No cold intolerance  No constipation  No  Symptoms suggestive of HYPERTHYROIDISM:  weight loss  No heat intolerance No hyperdefecation  No palpitations  Yes  Compressive symptoms:  dysphagia  No dysphonia  Yes positional dyspnea (especially with simultaneous arms elevation)  No  Smokes  No On biotin  No Personal history of head/neck surgery/irradiation  No  THYROID ULTRASOUND   TECHNIQUE: Ultrasound examination of the thyroid gland and adjacent soft tissues was performed.   COMPARISON:  US Thyroid, 12/28/2016 and 02/16/2015. CT neck, 03/12/2013.   FINDINGS: Parenchymal Echotexture: Moderately heterogenous   Isthmus: 1.0 cm   Right lobe: 4.8 x 2.8 x 2.6 cm   Left lobe: 6.5 x 4.4 x 3.4 cm   _________________________________________________________   Estimated total number of nodules >/= 1 cm: 2   Number of spongiform nodules >/=  2 cm not described below (TR1): 2   Number of mixed cystic and solid nodules >/= 1.5 cm not described below (TR2): 0   _________________________________________________________   Nodule # 1:   Location: RIGHT; mid   Maximum size: 3.1 cm; Other 2 dimensions: 2.0 x 2.1 cm, previously 2.9 x 1.9 x 2.2 cm   No aggressive features on today's evaluation. This nodule appears morphologically stable for least 5 years, accounting for errors in measurement, and was previously biopsied in 08/02/2006.   Assuming a benign pathologic diagnosis, repeat sampling and/or dedicated follow-up is not recommended.   _________________________________________________________   Nodule # 2:   Location: LEFT; Mid  Maximum size: 5.8 cm; Other 2 dimensions: 3.8 x 3.7 cm, previously 3.3 x 3.2 x 2.6 cm   This nodule was previously biopsied in 08/02/2006, however has considerably increased in size since  12/2016 comparison.   Composition: solid/almost completely solid (2)   Echogenicity: isoechoic (1)   Shape: not taller-than-wide (0)   Margins: ill-defined (0)   Echogenic foci: none (0)   ACR TI-RADS total points: 3.   ACR TI-RADS risk category: TR3 (3 points).   ACR TI-RADS recommendations:   **Given size (>/= 2.5 cm) and appearance, fine needle aspiration of this mildly suspicious nodule should be considered based on TI-RADS criteria.   _________________________________________________________   No cervical adenopathy or abnormal fluid collection within the imaged neck.   IMPRESSION: 1. Stable appearance of previously-biopsied 3.1 cm RIGHT mid thyroid nodule for at least 5 years. Repeat sampling and/or dedicated follow-up is not recommended. 2. Enlargement of previously-biopsied LEFT mid thyroid nodule, now measuring up to 5.8 cm (3.3 cm in 12/2016). If continued clinical concern, repeat sampling to confirm benignity could be performed.  Physical Exam  BP 126/80   Pulse 73   Ht 5\' 6"  (1.676 m)   Wt 192 lb (87.1 kg)   SpO2 99%   BMI 30.99 kg/m  Constitutional: well developed, well nourished Head: normocephalic, atraumatic, no exophthalmos Eyes: sclera anicteric, no redness Neck: + thyromegaly, no thyroid tenderness; no nodules palpated Lungs: normal respiratory effort Neurology: alert and oriented, no fine hand tremor Skin: dry, no appreciable rashes Musculoskeletal: no appreciable defects Psychiatric: normal mood and affect  Allergies Allergies  Allergen Reactions   Morphine And Codeine Shortness Of Breath   Other     TRILTYE- stomach upset   Sulfa Antibiotics Hives    Current Medications Patient's Medications  New Prescriptions   No medications on file  Previous Medications   APOAEQUORIN (PREVAGEN) 10 MG CAPS    Take by mouth daily at 6 (six) AM.   ASCORBIC ACID (VITAMIN C) POWD    as needed (immune support).   CHLORPHENIRAMINE (ALLERGY  RELIEF) 4 MG TABLET    Take 4 mg by mouth 2 (two) times daily as needed for allergies.   CHOLECALCIFEROL (VITAMIN D3) 250 MCG (10000 UT) CAPSULE    Take 10,000 Units by mouth daily.   CYANOCOBALAMIN (VITAMIN B 12 PO)    Take by mouth.   FAMOTIDINE (PEPCID) 20 MG TABLET    One after supper   GINSENG CHINESE RED PO    Take by mouth.   GLUCOSAMINE-CHONDROITIN-MSM 375-300-237.5 MG CAPS    Take by mouth.   OMEPRAZOLE (PRILOSEC) 40 MG CAPSULE    Take 30-60 min before first meal of the day   OVER THE COUNTER MEDICATION    Curamin- 2 tablets daily.   RED YEAST RICE EXTRACT (RED YEAST RICE PO)    Take 2 tablets by mouth daily.  Modified Medications   No medications on file  Discontinued Medications   No medications on file    Past Medical History Past Medical History:  Diagnosis Date   Abnormal liver enzymes    Acid reflux    Arthritis    back and neck   Bloating    Bronchitis    Chest pain, unspecified    Diarrhea    Dyspepsia    Encounter for Medicare annual wellness exam 06/13/2023   Esophageal reflux    H/O acute pancreatitis    H/O hiatal hernia    Headache(784.0)    migraines  Hemorrhage of rectum and anus    Hypercholesteremia    Nausea    Sjogren's disease (HCC)    Sleep apnea    Spinal stenosis, cervical region    Thyroid disease    Thyroid nodule     Past Surgical History Past Surgical History:  Procedure Laterality Date   ABDOMINAL HYSTERECTOMY  2007   total with BSO   BIOPSY THYROID  02/16/2023   capsulorhexis Left 09/13/2023   (eye procedure)   CHOLECYSTECTOMY  1985   COLONOSCOPY     ECTOPIC PREGNANCY SURGERY  1984   ESOPHAGOGASTRODUODENOSCOPY     EUS  12/21/2011   Procedure: ESOPHAGEAL ENDOSCOPIC ULTRASOUND (EUS) RADIAL;  Surgeon: Willis Modena, MD;  Location: [facility];  Service: Endoscopy;  Laterality: N/A;   eye cautery     EYE SURGERY     lasik   EYE SURGERY Left    cataract extraction    KNEE ARTHROSCOPY Right 06/11/2020   torn meniscus      Family History family history includes Cancer in her brother and mother; Colon cancer in her mother; Early death in her brother, father, and mother; Healthy in her daughter and son; Hearing loss in her mother; Heart disease in her maternal grandfather; Hypertension in her maternal grandfather; Pancreatic cancer in her brother.  Social History Social History   Socioeconomic History   Marital status: Married    Spouse name: Not on file   Number of children: 2   Years of education: Not on file   Highest education level: Bachelor's degree (e.g., BA, AB, BS)  Occupational History   Not on file  Tobacco Use   Smoking status: Never    Passive exposure: Past   Smokeless tobacco: Never  Vaping Use   Vaping status: Never Used  Substance and Sexual Activity   Alcohol use: Yes    Comment: 1-2 times a year   Drug use: No   Sexual activity: Yes    Birth control/protection: None    Comment: Hysterectomy  Other Topics Concern   Not on file  Social History Narrative   Married.   Drinks occasional sweet tea.   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   Social Drivers of Corporate investment banker Strain: Low Risk  (06/13/2023)   Overall Financial Resource Strain (CARDIA)    Difficulty of Paying Living Expenses: Not hard at all  Food Insecurity: No Food Insecurity (06/13/2023)   Hunger Vital Sign    Worried About Running Out of Food in the Last Year: Never true    Ran Out of Food in the Last Year: Never true  Transportation Needs: No Transportation Needs (06/13/2023)   PRAPARE - Administrator, Civil Service (Medical): No    Lack of Transportation (Non-Medical): No  Physical Activity: Sufficiently Active (06/13/2023)   Exercise Vital Sign    Days of Exercise per Week: 3 days    Minutes of Exercise per Session: 60 min  Stress: No Stress Concern Present (06/13/2023)   [employer] of Occupational Health - Occupational Stress Questionnaire     Feeling of Stress : Not at all  Social Connections: Socially Integrated (06/13/2023)   Social Connection and Isolation Panel [NHANES]    Frequency of Communication with Friends and Family: More than three times a week    Frequency of Social Gatherings with Friends and Family: More than three times a week    Attends Religious Services: More than 4 times per year  Active Member of Clubs or Organizations: Yes    Attends Club or Organization Meetings: More than 4 times per year    Marital Status: Married  Intimate Partner Violence: Not At Risk (06/13/2023)   Humiliation, Afraid, Rape, and Kick questionnaire    Fear of Current or Ex-Partner: No    Emotionally Abused: No    Physically Abused: No    Sexually Abused: No    Laboratory Investigations Lab Results  Component Value Date   TSH 0.98 09/28/2023   TSH 1.620 05/16/2023   TSH 1.200 02/15/2022   FREET4 1.3 09/28/2023   FREET4 1.24 05/16/2023     No results found for: "TSI"   No components found for: "TRAB"   Lab Results  Component Value Date   CHOL 206 (H) 03/01/2023   Lab Results  Component Value Date   HDL 65 03/01/2023   Lab Results  Component Value Date   LDLCALC 132 (H) 03/01/2023   Lab Results  Component Value Date   TRIG 52 03/01/2023   Lab Results  Component Value Date   CHOLHDL 3.2 03/01/2023   Lab Results  Component Value Date   CREATININE 0.84 09/26/2023   No results found for: "GFR"    Component Value Date/Time   NA 141 09/26/2023 0925   NA 147 (H) 06/07/2018 0825   K 4.5 09/26/2023 0925   CL 105 09/26/2023 0925   CO2 28 09/26/2023 0925   GLUCOSE 87 09/26/2023 0925   BUN 11 09/26/2023 0925   BUN 12 06/07/2018 0825   CREATININE 0.84 09/26/2023 0925   CALCIUM 9.8 09/26/2023 0925   PROT 7.1 09/26/2023 0925   PROT 7.0 09/26/2023 0925   PROT 7.0 06/07/2018 0825   ALBUMIN 3.6 01/09/2021 1100   AST 17 09/26/2023 0925   ALT 15 09/26/2023 0925   ALKPHOS 64 01/09/2021 1100   BILITOT 1.1  09/26/2023 0925   GFRNONAA >60 06/30/2022 1300   GFRNONAA 76 09/29/2020 1431   GFRAA 88 09/29/2020 1431      Latest Ref Rng & Units 09/26/2023    9:25 AM 04/26/2023   12:13 PM 10/27/2022    8:41 AM  BMP  Glucose 65 - 99 mg/dL 87  87  86   BUN 7 - 25 mg/dL 11  16  12    Creatinine 0.50 - 1.05 mg/dL 9.56  2.13  0.86   BUN/Creat Ratio 6 - 22 (calc) SEE NOTE:  SEE NOTE:  SEE NOTE:   Sodium 135 - 146 mmol/L 141  140  141   Potassium 3.5 - 5.3 mmol/L 4.5  5.0  4.6   Chloride 98 - 110 mmol/L 105  103  105   CO2 20 - 32 mmol/L 28  25  26    Calcium 8.6 - 10.4 mg/dL 9.8  57.8  9.9        Component Value Date/Time   WBC 5.3 09/26/2023 0925   RBC 4.95 09/26/2023 0925   HGB 13.9 09/26/2023 0925   HGB 13.9 06/07/2023 0953   HCT 42.0 09/26/2023 0925   HCT 43.4 06/07/2023 0953   PLT 240 09/26/2023 0925   PLT 232 06/07/2023 0953   MCV 84.8 09/26/2023 0925   MCV 88 06/07/2023 0953   MCH 28.1 09/26/2023 0925   MCHC 33.1 09/26/2023 0925   RDW 13.0 09/26/2023 0925   RDW 13.5 06/07/2023 0953   LYMPHSABS 1.8 06/07/2023 0953   MONOABS 0.5 01/09/2021 0606   EOSABS 180 09/26/2023 0925   EOSABS 0.2 06/07/2023  9528   BASOSABS 21 09/26/2023 0925   BASOSABS 0.0 06/07/2023 0953      Parts of this note may have been dictated using voice recognition software. There may be variances in spelling and vocabulary which are unintentional. Not all errors are proofread. Please notify the Thereasa Parkin if any discrepancies are noted or if the meaning of any statement is not clear.

## 2023-12-12 ENCOUNTER — Ambulatory Visit (HOSPITAL_BASED_OUTPATIENT_CLINIC_OR_DEPARTMENT_OTHER)
Admission: RE | Admit: 2023-12-12 | Discharge: 2023-12-12 | Disposition: A | Discharge status: Home / Self Care | Source: Ambulatory Visit | Attending: "Endocrinology | Admitting: "Endocrinology

## 2023-12-12 ENCOUNTER — Ambulatory Visit: Payer: Medicare HMO | Admitting: Internal Medicine

## 2023-12-12 VITALS — BP 110/70 | HR 78 | Temp 97.9°F | Ht 66.0 in | Wt 190.2 lb

## 2023-12-12 DIAGNOSIS — E042 Nontoxic multinodular goiter: Secondary | ICD-10-CM | POA: Diagnosis not present

## 2023-12-12 DIAGNOSIS — E6609 Other obesity due to excess calories: Secondary | ICD-10-CM

## 2023-12-12 DIAGNOSIS — M3500 Sicca syndrome, unspecified: Secondary | ICD-10-CM

## 2023-12-12 DIAGNOSIS — G4733 Obstructive sleep apnea (adult) (pediatric): Secondary | ICD-10-CM

## 2023-12-12 DIAGNOSIS — R0982 Postnasal drip: Secondary | ICD-10-CM

## 2023-12-12 DIAGNOSIS — E041 Nontoxic single thyroid nodule: Secondary | ICD-10-CM | POA: Diagnosis not present

## 2023-12-12 DIAGNOSIS — E66811 Obesity, class 1: Secondary | ICD-10-CM

## 2023-12-12 DIAGNOSIS — E7841 Elevated Lipoprotein(a): Secondary | ICD-10-CM

## 2023-12-12 DIAGNOSIS — I2510 Atherosclerotic heart disease of native coronary artery without angina pectoris: Secondary | ICD-10-CM

## 2023-12-12 DIAGNOSIS — Z6830 Body mass index (BMI) 30.0-30.9, adult: Secondary | ICD-10-CM

## 2023-12-12 NOTE — Assessment & Plan Note (Signed)
 Mucus accumulation possibly due to allergies. Clopheniramine effective without drowsiness. - Continue clopheniramine, especially at night. - Consider alternative allergy medications if needed.

## 2023-12-12 NOTE — Assessment & Plan Note (Signed)
 She is encouraged to aim for at least 150 minutes of exercise per week, while striving for BMI<29

## 2023-12-12 NOTE — Patient Instructions (Signed)
 Thyroid Nodule  A thyroid nodule is an isolated growth of thyroid cells that forms a lump in the thyroid gland. The thyroid gland is a butterfly-shaped gland found in the lower front of the neck. It sends chemical messengers (hormones) through the blood to all parts of the body. These hormones are important in regulating body temperature and helping the body use energy. Thyroid nodules are common. Most are not cancerous (are benign). You may have one nodule or several nodules. There are different types of thyroid nodules. They include nodules that: Grow and fill with fluid (thyroid cysts). Produce too much thyroid hormone (hot nodules or hyperthyroid). Produce no thyroid hormone (cold nodules or hypothyroid). Form from cancer cells (thyroid cancers). What are the causes? In most cases, the cause of thyroid nodules is not known. What increases the risk? The following factors may make you more likely to develop thyroid nodules: Age. Thyroid nodules are more common in people who are older than 45 years. Female gender. A family history that includes: Thyroid nodules. Pheochromocytoma. Thyroid carcinoma. Hyperparathyroidism. Certain thyroid diseases, such as Hashimoto's thyroiditis. Lack of iodine in your diet. A history of head and neck radiation, such as from cancer treatments. Type 2 diabetes. What are the signs or symptoms? In many cases, there are no symptoms. If you have symptoms, they may include: A lump in your lower neck. Feeling pressure, fullness, or a tickle in your throat. Pain in your neck, jaw, or ear. Having trouble swallowing or breathing. Hot nodules may cause: Weight loss. Warm, flushed skin. Feeling hot. Feeling nervous. A rapid or irregular heartbeat. Cold nodules may cause: Weight gain. Dry skin. Hair loss, brittle hair, or both. Feeling cold. Fatigue. Thyroid cancer nodules may cause: Hard nodules that can be felt along the thyroid  gland. Hoarseness. Lumps in the tissue (lymph nodes) near your thyroid gland. How is this diagnosed? A thyroid nodule may be felt by your health care provider during a physical exam. This condition may also be diagnosed based on your symptoms. You may also have tests, including: Blood tests to check how well your thyroid is working. An ultrasound. This may be done to confirm the diagnosis. A biopsy. This involves taking a sample from the nodule and looking at it under a microscope. A thyroid scan. This test creates an image of the thyroid gland using a radioactive tracer. Imaging tests such as an MRI or CT scan. These may be done if: A nodule is large. A nodule is blocking your airway. Cancer is suspected. How is this treated? Treatment depends on the cause and size of your nodule or nodules. If a nodule is benign, treatment may not be necessary. Your health care provider may monitor the nodule to see if it goes away without treatment. If a nodule continues to grow, is cancerous, or does not go away, treatment may be needed. Treatment may include: Having a cystic nodule drained with a needle. Ablation therapy. In this treatment, alcohol is injected into the area of the nodule to destroy the cells. Ablation with heat may also be used. This is called thermal ablation. Radioactive iodine. In this treatment, radioactive iodine is given as a pill or liquid that you drink. This substance causes the thyroid nodule to shrink. Surgery to remove the nodule or nodules. Part or all of your thyroid gland may also need to be removed. Medicines to treat hyperthyroidism. Follow these instructions at home: Pay attention to any changes in your thyroid nodule or nodules. Take over-the-counter  and prescription medicines only as told by your health care provider. Keep all follow-up visits. This is important. Contact a health care provider if: You have trouble sleeping. You have muscle weakness. You have  significant weight loss without changing your eating habits. You feel nervous. You have trouble swallowing. You have increased swelling. You have a rapid or irregular heartbeat. Get help right away if: You have chest pain. You faint or lose consciousness. Your nodule makes it hard for you to breathe. These symptoms may be an emergency. Get help right away. Call 911. Do not wait to see if the symptoms will go away. Do not drive yourself to the hospital. Summary A thyroid nodule is an isolated growth of thyroid cells that forms a lump in your thyroid gland. Thyroid nodules are common. Most are not cancerous. Your health care provider may monitor the nodule to see if it goes away without treatment. If a nodule continues to grow, is cancerous, or does not go away, treatment may be needed. Treatment depends on the cause and size of your nodule or nodules. This information is not intended to replace advice given to you by your health care provider. Make sure you discuss any questions you have with your health care provider. Document Revised: 07/05/2021 Document Reviewed: 07/05/2021 Elsevier Patient Education  2024 [employer].

## 2023-12-12 NOTE — Assessment & Plan Note (Signed)
 Chronic, currently untreated. I will refer her to sleep specialist, Dr. Vickey Huger as requested.

## 2023-12-12 NOTE — Assessment & Plan Note (Signed)
 Chronic, she is now followed by Endocrinology.  Their input is appreciated. She has had thyroid bx in the past.  Previous pathology results significant for benign follicular nodule ([facility] II).

## 2023-12-12 NOTE — Progress Notes (Signed)
 I,Victoria T Deloria Lair, CMA,acting as a Neurosurgeon for Gwynneth Aliment, MD.,have documented all relevant documentation on the behalf of Gwynneth Aliment, MD,as directed by  Gwynneth Aliment, MD while in the presence of Gwynneth Aliment, MD.  Subjective:  Patient ID: [name] [name] , female    DOB: Nov 25, 1955 , 68 y.o.   MRN: [id number]  Chief Complaint  Patient presents with   Thyroid Nodule    Patient presents today for thyroid nodule follow up. She would like a referral for sleep study. She would like to go back to using Cpap machine. Previously she stopped using machine due to excessive mucus build up. She is also scheduled for thyroid US today.      HPI Discussed the use of AI scribe software for clinical note transcription with the patient, who gave verbal consent to proceed.  History of Present Illness [name] [name] is a 68 year old female who presents for follow-up and CPAP issues.  She is here for a follow-up regarding her thyroid nodule. She has been under the care of an endocrinologist, who plans to repeat the ultrasound. She is scheduled for the u/s later this afternoon.  Blood work has been conducted, and no additional thyroid lab work is planned at this time.  She has been experiencing issues with her CPAP machine, which she used for about two years but discontinued due to mucus problems and a sensation of inflammation. She experiences choking on mucus throughout the night despite cleaning the machine and is seeking recommendations to address these issues.  A CT of the heart revealed a calcium score of eight, indicating plaque in the heart arteries. She is not currently on cholesterol medication, as she is managing her condition with diet and exercise. She plans to follow up with her cardiologist next month.  She experienced discomfort in the incision area and severe constipation, for which a CT of the abdomen was ordered by her GYN. She has been managing her constipation with exercise,  water, Metamucil, and juicing, and reports improvement since December.  She is seeing a rheumatologist for Sjogren's syndrome. No dry mouth but experiences a lot of mucus. Blood work is anticipated during her next visit.  Her current medications include omeprazole taken occasionally and clopheniramine for allergies, which does not cause drowsiness. She also takes red yeast rice and uses Mucinex for mucus issues..   Past Medical History:  Diagnosis Date   Abnormal liver enzymes    Acid reflux    Arthritis    back and neck   Bloating    Bronchitis    Chest pain, unspecified    Diarrhea    Dyspepsia    Encounter for Medicare annual wellness exam 06/13/2023   Esophageal reflux    H/O acute pancreatitis    H/O hiatal hernia    Headache(784.0)    migraines   Hemorrhage of rectum and anus    Hypercholesteremia    Nausea    Sjogren's disease (HCC)    Sleep apnea    Spinal stenosis, cervical region    Thyroid disease    Thyroid nodule      Family History  Problem Relation Age of Onset   Colon cancer Mother    Cancer Mother    Early death Mother    Hearing loss Mother    Early death Father    Pancreatic cancer Brother    Healthy Daughter    Healthy Son    Heart disease Maternal Grandfather  Hypertension Maternal Grandfather    Cancer Brother    Early death Brother      Current Outpatient Medications:    Apoaequorin (PREVAGEN) 10 MG CAPS, Take by mouth daily at 6 (six) AM., Disp: , Rfl:    Ascorbic Acid (VITAMIN C) POWD, as needed (immune support)., Disp: , Rfl:    chlorpheniramine (ALLERGY RELIEF) 4 MG tablet, Take 4 mg by mouth 2 (two) times daily as needed for allergies., Disp: , Rfl:    Cholecalciferol (VITAMIN D3) 250 MCG (10000 UT) capsule, Take 10,000 Units by mouth daily., Disp: , Rfl:    Cyanocobalamin (VITAMIN B 12 PO), Take by mouth., Disp: , Rfl:    GINSENG CHINESE RED PO, Take by mouth., Disp: , Rfl:    Glucosamine-Chondroitin-MSM 375-300-237.5 MG CAPS,  Take by mouth., Disp: , Rfl:    omeprazole (PRILOSEC) 40 MG capsule, Take 30-60 min before first meal of the day, Disp: 30 capsule, Rfl: 11   OVER THE COUNTER MEDICATION, Curamin- 2 tablets daily., Disp: , Rfl:    Red Yeast Rice Extract (RED YEAST RICE PO), Take 2 tablets by mouth daily., Disp: , Rfl:    Allergies  Allergen Reactions   Morphine And Codeine Shortness Of Breath   Other     TRILTYE- stomach upset   Sulfa Antibiotics Hives     Review of Systems  Constitutional: Negative.   HENT:  Positive for postnasal drip.   Respiratory: Negative.    Cardiovascular: Negative.   Gastrointestinal: Negative.   Neurological: Negative.   Psychiatric/Behavioral: Negative.       Today's Vitals   12/12/23 1024  BP: 110/70  Pulse: 78  Temp: 97.9 F (36.6 C)  SpO2: 98%  Weight: 190 lb 3.2 oz (86.3 kg)  Height: 5\' 6"  (1.676 m)   Body mass index is 30.7 kg/m.  Wt Readings from Last 3 Encounters:  12/12/23 190 lb 3.2 oz (86.3 kg)  11/23/23 192 lb (87.1 kg)  09/26/23 191 lb 3.2 oz (86.7 kg)     Objective:  Physical Exam Vitals and nursing note reviewed.  Constitutional:      Appearance: Normal appearance.  HENT:     Head: Normocephalic and atraumatic.  Eyes:     Extraocular Movements: Extraocular movements intact.  Cardiovascular:     Rate and Rhythm: Normal rate and regular rhythm.     Heart sounds: Normal heart sounds.  Pulmonary:     Effort: Pulmonary effort is normal.     Breath sounds: Normal breath sounds.  Musculoskeletal:     Cervical back: Normal range of motion.  Skin:    General: Skin is warm.  Neurological:     General: No focal deficit present.     Mental Status: She is alert.  Psychiatric:        Mood and Affect: Mood normal.        Behavior: Behavior normal.         Assessment And Plan:  Thyroid nodule Assessment & Plan: Chronic, she is now followed by Endocrinology.  Their input is appreciated. She has had thyroid bx in the past.  Previous  pathology results significant for benign follicular nodule (Bethesda II).    OSA (obstructive sleep apnea) Assessment & Plan: Chronic, currently untreated. I will refer her to sleep specialist, Dr. Vickey Huger as requested.   Orders: -     Ambulatory referral to Neurology  Coronary artery calcification Assessment & Plan: Chronic, LDL goal is less than 70. Not currently on statin therapy.  She prefers to continue with RYR therapy. She has also had a Cardiology evaluation, they agreed to hold off statins at this time.    Calcium score of 8 and elevated LP(a) indicate increased cardiac risk. Statin therapy would be helpful in my opinion. - Order lab work for cholesterol screening before cardiology follow-up. - Defer management to Cardiology.    Orders: -     CMP14+EGFR; Future -     Lipid panel; Future -     Hemoglobin A1c; Future -     Insulin, random; Future  Elevated Lp(a) Assessment & Plan: Implies there may be genetic predisposition to cardiac risk. She is encouraged to follow heart healthy lifestyle including clean eating, regular exercise, adequate sleep and stress management. Statin therapy as per Cardiology.   Orders: -     CMP14+EGFR; Future -     Lipid panel; Future  Postnasal drip Assessment & Plan: Mucus accumulation possibly due to allergies. Clopheniramine effective without drowsiness. - Continue clopheniramine, especially at night. - Consider alternative allergy medications if needed.   Sjogren's syndrome, with unspecified organ involvement [facility]) Assessment & Plan: Under rheumatologist care. Reports mucus accumulation, no xerostomia. - Coordinate with rheumatologist for Sjogren's management.   Class 1 obesity due to excess calories without serious comorbidity with body mass index (BMI) of 30.0 to 30.9 in adult Assessment & Plan: She is encouraged to aim for at least 150 minutes of exercise per week, while striving for BMI<29    General Health  Maintenance Exercise beneficial for bone health. Awaiting bone density test results. - Request bone density test results from Dr. Marcelle Overlie. - Encourage regular exercise, particularly walking, for bone health.   Return in 13 days (on 12/25/2023), or lab visit, fasting, labs in.  Patient was given opportunity to ask questions. Patient verbalized understanding of the plan and was able to repeat key elements of the plan. All questions were answered to their satisfaction.    I, Gwynneth Aliment, MD, have reviewed all documentation for this visit. The documentation on 12/12/23 for the exam, diagnosis, procedures, and orders are all accurate and complete.   IF YOU HAVE BEEN REFERRED TO A SPECIALIST, IT MAY TAKE 1-2 WEEKS TO SCHEDULE/PROCESS THE REFERRAL. IF YOU HAVE NOT HEARD FROM US/SPECIALIST IN TWO WEEKS, PLEASE GIVE [place] A CALL AT [phone] X [phone].   THE PATIENT IS ENCOURAGED TO PRACTICE SOCIAL DISTANCING DUE TO THE COVID-19 PANDEMIC.

## 2023-12-12 NOTE — Assessment & Plan Note (Addendum)
 Chronic, LDL goal is less than 70. Not currently on statin therapy. She prefers to continue with RYR therapy. She has also had a Cardiology evaluation, they agreed to hold off statins at this time.    Calcium score of 8 and elevated LP(a) indicate increased cardiac risk. Statin therapy would be helpful in my opinion. - Order lab work for cholesterol screening before cardiology follow-up. - Defer management to Cardiology.

## 2023-12-12 NOTE — Assessment & Plan Note (Signed)
 Under rheumatologist care. Reports mucus accumulation, no xerostomia. - Coordinate with rheumatologist for Sjogren's management.

## 2023-12-12 NOTE — Assessment & Plan Note (Signed)
 Implies there may be genetic predisposition to cardiac risk. She is encouraged to follow heart healthy lifestyle including clean eating, regular exercise, adequate sleep and stress management. Statin therapy as per Cardiology.

## 2023-12-19 ENCOUNTER — Encounter: Payer: Self-pay | Admitting: "Endocrinology

## 2023-12-19 ENCOUNTER — Ambulatory Visit: Admitting: "Endocrinology

## 2023-12-19 VITALS — BP 100/80 | HR 68 | Ht 66.0 in | Wt 193.0 lb

## 2023-12-19 DIAGNOSIS — E042 Nontoxic multinodular goiter: Secondary | ICD-10-CM | POA: Diagnosis not present

## 2023-12-19 NOTE — Progress Notes (Signed)
 Outpatient Endocrinology Note [name] Colona, MD  12/19/23   [name] [name] 03/21/1956 [id number]  Referring Provider: Dorothyann Peng, MD Primary Care Provider: Dorothyann Peng, MD Subjective  No chief complaint on file.   Assessment & Plan  Diagnoses and all orders for this visit:  Multinodular goiter -     Cancel: [place] FNA BX THYROID 1ST LESION AFIRMA; Future -     Cancel: [place] FNA BX THYROID 1ST LESION AFIRMA; Future -     Cancel: [place] FNA BX THYROID 1ST LESION AFIRMA -     [place] FNA BX THYROID 1ST LESION AFIRMA; Future -     [place] FNA BX THYROID 1ST LESION AFIRMA   [name] [name] is currently not taking any thyroid medication.. Patient is currently biochemically euthyroid.  Educated on thyroid axis.  No indication for any thyroid medication  History of multinodular goiter S/p FNA in 2007 Left nodule (5.5 x 3.8 x 3.3 cm  previously biopsied in 08/02/2006, was 2.3 x 1.0 x 2.2 cm then): GROUPS OF FOLLICULAR EPITHELIAL CELLS WITH HcRTHLE CELL CHANGES ADMIXED WITH LYMPHOCYTES: S/p FNA in 2024 Left; Mid TR3 5.8 cm x 3.8 x 3.7 cm: Benign follicular nodule (Bethesda category II)  - (nodule 2 in 2023 U/S) S/p FNA in 2007 Right nodule: FINDINGS CONSISTENT WITH NON-NEOPLASTIC GOITER (1.5 x 1.3 x 1.8 cm in 2007 and 3.1 x 2.1 x 2.1 cm in 12/2023): grown in volume by 289.49% hence re-ordered FNA Last thyroid U/S in 12/2023   I have reviewed current medications, nurse's notes, allergies, vital signs, past medical and surgical history, family medical history, and social history for this encounter. Counseled patient on symptoms, examination findings, lab findings, imaging results, treatment decisions and monitoring and prognosis. The patient understood the recommendations and agrees with the treatment plan. All questions regarding treatment plan were fully answered.  Return in about 1 year (around 12/18/2024).   [name] Linden, MD  12/19/23   I have reviewed current medications, nurse's  notes, allergies, vital signs, past medical and surgical history, family medical history, and social history for this encounter. Counseled patient on symptoms, examination findings, lab findings, imaging results, treatment decisions and monitoring and prognosis. The patient understood the recommendations and agrees with the treatment plan. All questions regarding treatment plan were fully answered.  History of Present Illness [name] [name] is a 68 y.o. year old female who presents to our clinic with multinodular goiter diagnosed around 2007.    Never been on thyroid medication   Symptoms suggestive of HYPOTHYROIDISM:  fatigue No weight gain No cold intolerance  No constipation  No  Symptoms suggestive of HYPERTHYROIDISM:  weight loss  No heat intolerance No hyperdefecation  No palpitations  Yes, sometimes   Compressive symptoms:  dysphagia  No dysphonia  No positional dyspnea (especially with simultaneous arms elevation)  No  Smokes  No On biotin  No Personal history of head/neck surgery/irradiation  No  THYROID ULTRASOUND   TECHNIQUE: Ultrasound examination of the thyroid gland and adjacent soft tissues was performed.   COMPARISON:  US Thyroid, 12/28/2016 and 02/16/2015. CT neck, 03/12/2013.   FINDINGS: Parenchymal Echotexture: Moderately heterogenous   Isthmus: 1.0 cm   Right lobe: 4.8 x 2.8 x 2.6 cm   Left lobe: 6.5 x 4.4 x 3.4 cm   _________________________________________________________   Estimated total number of nodules >/= 1 cm: 2   Number of spongiform nodules >/=  2 cm not described below (TR1): 2   Number of mixed  cystic and solid nodules >/= 1.5 cm not described below (TR2): 0   _________________________________________________________   Nodule # 1:   Location: RIGHT; mid   Maximum size: 3.1 cm; Other 2 dimensions: 2.0 x 2.1 cm, previously 2.9 x 1.9 x 2.2 cm   No aggressive features on today's evaluation. This nodule  appears morphologically stable for least 5 years, accounting for errors in measurement, and was previously biopsied in 08/02/2006.   Assuming a benign pathologic diagnosis, repeat sampling and/or dedicated follow-up is not recommended.   _________________________________________________________   Nodule # 2:   Location: LEFT; Mid   Maximum size: 5.8 cm; Other 2 dimensions: 3.8 x 3.7 cm, previously 3.3 x 3.2 x 2.6 cm   This nodule was previously biopsied in 08/02/2006, however has considerably increased in size since 12/2016 comparison.   Composition: solid/almost completely solid (2)   Echogenicity: isoechoic (1)   Shape: not taller-than-wide (0)   Margins: ill-defined (0)   Echogenic foci: none (0)   ACR TI-RADS total points: 3.   ACR TI-RADS risk category: TR3 (3 points).   ACR TI-RADS recommendations:   **Given size (>/= 2.5 cm) and appearance, fine needle aspiration of this mildly suspicious nodule should be considered based on TI-RADS criteria.   _________________________________________________________   No cervical adenopathy or abnormal fluid collection within the imaged neck.   IMPRESSION: 1. Stable appearance of previously-biopsied 3.1 cm RIGHT mid thyroid nodule for at least 5 years. Repeat sampling and/or dedicated follow-up is not recommended. 2. Enlargement of previously-biopsied LEFT mid thyroid nodule, now measuring up to 5.8 cm (3.3 cm in 12/2016). If continued clinical concern, repeat sampling to confirm benignity could be performed.   2007 ULTRASOUND SOFT TISSUE HEAD AND NECK:  Technique:  Ultrasound examination of the thyroid gland and adjacent soft tissue structures was performed.  Findings:  Scans over the thyroid gland were performed.  The thyroid gland is borderline enlarged.  The right lobe measures 4.9 cm sagittally with a depth of 2.0 cm and width of 2.7 cm.  The left lobe measures 4.0 x 2.1 x 2.5 cm with the isthmus being thickened  at 5.6 mm.  The gland is inhomogeneous.  There are two dominant lesions.  The largest is poorly defined and in the lower lobe on the left of 2.3 x 1.0 x 2.2 cm.  Another solid lesion is noted in the lower pole on the right of 1.5 x 1.3 x 1.8 cm.  In view of the size of these nodules, percutaneous aspiration/biopsy is recommended.   IMPRESSION:  Two solid dominant nodules in the lower poles, the left larger than right.  In view of the size and appearance of these nodules, recommend percutaneous biopsy.  Physical Exam  BP 100/80   Pulse 68   Ht 5\' 6"  (1.676 m)   Wt 193 lb (87.5 kg)   SpO2 98%   BMI 31.15 kg/m  Constitutional: well developed, well nourished Head: normocephalic, atraumatic, no exophthalmos Eyes: sclera anicteric, no redness Neck: + thyromegaly, no thyroid tenderness; no nodules palpated Lungs: normal respiratory effort Neurology: alert and oriented, no fine hand tremor Skin: dry, no appreciable rashes Musculoskeletal: no appreciable defects Psychiatric: normal mood and affect  Allergies Allergies  Allergen Reactions   Morphine And Codeine Shortness Of Breath   Other     TRILTYE- stomach upset   Sulfa Antibiotics Hives    Current Medications Patient's Medications  New Prescriptions   No medications on file  Previous Medications   APOAEQUORIN (PREVAGEN)  10 MG CAPS    Take by mouth daily at 6 (six) AM.   ASCORBIC ACID (VITAMIN C) POWD    as needed (immune support).   CHLORPHENIRAMINE (ALLERGY RELIEF) 4 MG TABLET    Take 4 mg by mouth 2 (two) times daily as needed for allergies.   CHOLECALCIFEROL (VITAMIN D3) 250 MCG (10000 UT) CAPSULE    Take 10,000 Units by mouth daily.   CYANOCOBALAMIN (VITAMIN B 12 PO)    Take by mouth.   GINSENG CHINESE RED PO    Take by mouth.   GLUCOSAMINE-CHONDROITIN-MSM 375-300-237.5 MG CAPS    Take by mouth.   OMEPRAZOLE (PRILOSEC) 40 MG CAPSULE    Take 30-60 min before first meal of the day   OVER THE COUNTER MEDICATION    Curamin- 2  tablets daily.   RED YEAST RICE EXTRACT (RED YEAST RICE PO)    Take 2 tablets by mouth daily.  Modified Medications   No medications on file  Discontinued Medications   No medications on file    Past Medical History Past Medical History:  Diagnosis Date   Abnormal liver enzymes    Acid reflux    Arthritis    back and neck   Bloating    Bronchitis    Chest pain, unspecified    Diarrhea    Dyspepsia    Encounter for Medicare annual wellness exam 06/13/2023   Esophageal reflux    H/O acute pancreatitis    H/O hiatal hernia    Headache(784.0)    migraines   Hemorrhage of rectum and anus    Hypercholesteremia    Nausea    Sjogren's disease (HCC)    Sleep apnea    Spinal stenosis, cervical region    Thyroid disease    Thyroid nodule     Past Surgical History Past Surgical History:  Procedure Laterality Date   ABDOMINAL HYSTERECTOMY  2007   total with BSO   BIOPSY THYROID  02/16/2023   capsulorhexis Left 09/13/2023   (eye procedure)   CHOLECYSTECTOMY  1985   COLONOSCOPY     ECTOPIC PREGNANCY SURGERY  1984   ESOPHAGOGASTRODUODENOSCOPY     EUS  12/21/2011   Procedure: ESOPHAGEAL ENDOSCOPIC ULTRASOUND (EUS) RADIAL;  Surgeon: Evangeline Hilts, MD;  Location: [facility];  Service: Endoscopy;  Laterality: N/A;   eye cautery     EYE SURGERY     lasik   EYE SURGERY Left    cataract extraction    KNEE ARTHROSCOPY Right 06/11/2020   torn meniscus     Family History family history includes Cancer in her brother and mother; Colon cancer in her mother; Early death in her brother, father, and mother; Healthy in her daughter and son; Hearing loss in her mother; Heart disease in her maternal grandfather; Hypertension in her maternal grandfather; Pancreatic cancer in her brother.  Social History Social History   Socioeconomic History   Marital status: Married    Spouse name: Not on file   Number of children: 2   Years of education: Not on file   Highest education level:  Bachelor's degree (e.g., BA, AB, BS)  Occupational History   Not on file  Tobacco Use   Smoking status: Never    Passive exposure: Past   Smokeless tobacco: Never  Vaping Use   Vaping status: Never Used  Substance and Sexual Activity   Alcohol use: Yes    Comment: 1-2 times a year   Drug use: No   Sexual activity:  Yes    Birth control/protection: None    Comment: Hysterectomy  Other Topics Concern   Not on file  Social History Narrative   Married.   Drinks occasional sweet tea.   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   Social Drivers of Corporate investment banker Strain: Low Risk  (12/12/2023)   Overall Financial Resource Strain (CARDIA)    Difficulty of Paying Living Expenses: Not hard at all  Food Insecurity: No Food Insecurity (12/12/2023)   Hunger Vital Sign    Worried About Running Out of Food in the Last Year: Never true    Ran Out of Food in the Last Year: Never true  Transportation Needs: No Transportation Needs (12/12/2023)   PRAPARE - Administrator, Civil Service (Medical): No    Lack of Transportation (Non-Medical): No  Physical Activity: Insufficiently Active (12/12/2023)   Exercise Vital Sign    Days of Exercise per Week: 2 days    Minutes of Exercise per Session: 50 min  Stress: No Stress Concern Present (12/12/2023)   [employer] of Occupational Health - Occupational Stress Questionnaire    Feeling of Stress : Not at all  Social Connections: Socially Integrated (12/12/2023)   Social Connection and Isolation Panel [NHANES]    Frequency of Communication with Friends and Family: More than three times a week    Frequency of Social Gatherings with Friends and Family: Once a week    Attends Religious Services: More than 4 times per year    Active Member of Clubs or Organizations: Yes    Attends Banker Meetings: 1 to 4 times per year    Marital Status: Married  Catering manager Violence: Not At Risk  (06/13/2023)   Humiliation, Afraid, Rape, and Kick questionnaire    Fear of Current or Ex-Partner: No    Emotionally Abused: No    Physically Abused: No    Sexually Abused: No    Laboratory Investigations Lab Results  Component Value Date   TSH 0.98 09/28/2023   TSH 1.620 05/16/2023   TSH 1.200 02/15/2022   FREET4 1.3 09/28/2023   FREET4 1.24 05/16/2023     No results found for: "TSI"   No components found for: "TRAB"   Lab Results  Component Value Date   CHOL 206 (H) 03/01/2023   Lab Results  Component Value Date   HDL 65 03/01/2023   Lab Results  Component Value Date   LDLCALC 132 (H) 03/01/2023   Lab Results  Component Value Date   TRIG 52 03/01/2023   Lab Results  Component Value Date   CHOLHDL 3.2 03/01/2023   Lab Results  Component Value Date   CREATININE 0.84 09/26/2023   No results found for: "GFR"    Component Value Date/Time   NA 141 09/26/2023 0925   NA 147 (H) 06/07/2018 0825   K 4.5 09/26/2023 0925   CL 105 09/26/2023 0925   CO2 28 09/26/2023 0925   GLUCOSE 87 09/26/2023 0925   BUN 11 09/26/2023 0925   BUN 12 06/07/2018 0825   CREATININE 0.84 09/26/2023 0925   CALCIUM 9.8 09/26/2023 0925   PROT 7.1 09/26/2023 0925   PROT 7.0 09/26/2023 0925   PROT 7.0 06/07/2018 0825   ALBUMIN 3.6 01/09/2021 1100   AST 17 09/26/2023 0925   ALT 15 09/26/2023 0925   ALKPHOS 64 01/09/2021 1100   BILITOT 1.1 09/26/2023 0925   GFRNONAA >60 06/30/2022 1300   GFRNONAA  76 09/29/2020 1431   GFRAA 88 09/29/2020 1431      Latest Ref Rng & Units 09/26/2023    9:25 AM 04/26/2023   12:13 PM 10/27/2022    8:41 AM  BMP  Glucose 65 - 99 mg/dL 87  87  86   BUN 7 - 25 mg/dL 11  16  12    Creatinine 0.50 - 1.05 mg/dL 1.61  0.96  0.45   BUN/Creat Ratio 6 - 22 (calc) SEE NOTE:  SEE NOTE:  SEE NOTE:   Sodium 135 - 146 mmol/L 141  140  141   Potassium 3.5 - 5.3 mmol/L 4.5  5.0  4.6   Chloride 98 - 110 mmol/L 105  103  105   CO2 20 - 32 mmol/L 28  25  26    Calcium 8.6  - 10.4 mg/dL 9.8  40.9  9.9        Component Value Date/Time   WBC 5.3 09/26/2023 0925   RBC 4.95 09/26/2023 0925   HGB 13.9 09/26/2023 0925   HGB 13.9 06/07/2023 0953   HCT 42.0 09/26/2023 0925   HCT 43.4 06/07/2023 0953   PLT 240 09/26/2023 0925   PLT 232 06/07/2023 0953   MCV 84.8 09/26/2023 0925   MCV 88 06/07/2023 0953   MCH 28.1 09/26/2023 0925   MCHC 33.1 09/26/2023 0925   RDW 13.0 09/26/2023 0925   RDW 13.5 06/07/2023 0953   LYMPHSABS 1.8 06/07/2023 0953   MONOABS 0.5 01/09/2021 0606   EOSABS 180 09/26/2023 0925   EOSABS 0.2 06/07/2023 0953   BASOSABS 21 09/26/2023 0925   BASOSABS 0.0 06/07/2023 0953      Parts of this note may have been dictated using voice recognition software. There may be variances in spelling and vocabulary which are unintentional. Not all errors are proofread. Please notify the [facility] if any discrepancies are noted or if the meaning of any statement is not clear.

## 2023-12-22 ENCOUNTER — Other Ambulatory Visit (HOSPITAL_COMMUNITY)
Admission: RE | Admit: 2023-12-22 | Discharge: 2023-12-22 | Disposition: A | Discharge status: Home / Self Care | Source: Ambulatory Visit | Attending: Interventional Radiology | Admitting: Interventional Radiology

## 2023-12-22 ENCOUNTER — Ambulatory Visit
Admission: RE | Admit: 2023-12-22 | Discharge: 2023-12-22 | Disposition: A | Discharge status: Home / Self Care | Source: Ambulatory Visit | Attending: "Endocrinology | Admitting: "Endocrinology

## 2023-12-22 DIAGNOSIS — E042 Nontoxic multinodular goiter: Secondary | ICD-10-CM

## 2023-12-22 DIAGNOSIS — E041 Nontoxic single thyroid nodule: Secondary | ICD-10-CM | POA: Diagnosis not present

## 2023-12-25 ENCOUNTER — Ambulatory Visit
Admission: RE | Admit: 2023-12-25 | Discharge: 2023-12-25 | Disposition: A | Discharge status: Home / Self Care | Source: Ambulatory Visit | Attending: Obstetrics and Gynecology | Admitting: Obstetrics and Gynecology

## 2023-12-25 ENCOUNTER — Other Ambulatory Visit

## 2023-12-25 ENCOUNTER — Other Ambulatory Visit: Payer: Self-pay

## 2023-12-25 DIAGNOSIS — I2510 Atherosclerotic heart disease of native coronary artery without angina pectoris: Secondary | ICD-10-CM

## 2023-12-25 DIAGNOSIS — E7841 Elevated Lipoprotein(a): Secondary | ICD-10-CM

## 2023-12-25 DIAGNOSIS — Z1231 Encounter for screening mammogram for malignant neoplasm of breast: Secondary | ICD-10-CM

## 2023-12-26 ENCOUNTER — Encounter: Payer: Self-pay | Admitting: Internal Medicine

## 2023-12-26 LAB — CMP14+EGFR
ALT: 18 IU/L (ref 0–32)
AST: 20 IU/L (ref 0–40)
Albumin: 4.6 g/dL (ref 3.9–4.9)
Alkaline Phosphatase: 110 IU/L (ref 44–121)
BUN/Creatinine Ratio: 14 (ref 12–28)
BUN: 12 mg/dL (ref 8–27)
Bilirubin Total: 0.8 mg/dL (ref 0.0–1.2)
CO2: 23 mmol/L (ref 20–29)
Calcium: 9.6 mg/dL (ref 8.7–10.3)
Chloride: 105 mmol/L (ref 96–106)
Creatinine, Ser: 0.86 mg/dL (ref 0.57–1.00)
Globulin, Total: 2.2 g/dL (ref 1.5–4.5)
Glucose: 88 mg/dL (ref 70–99)
Potassium: 4.4 mmol/L (ref 3.5–5.2)
Sodium: 142 mmol/L (ref 134–144)
Total Protein: 6.8 g/dL (ref 6.0–8.5)
eGFR: 74 mL/min/{1.73_m2} (ref >59)

## 2023-12-26 LAB — INSULIN, RANDOM: INSULIN: 10.2 u[IU]/mL (ref 2.6–24.9)

## 2023-12-26 LAB — LIPID PANEL
Chol/HDL Ratio: 3 ratio (ref 0.0–4.4)
Cholesterol, Total: 201 mg/dL — ABNORMAL HIGH (ref 100–199)
HDL: 66 mg/dL (ref >39)
LDL Chol Calc (NIH): 126 mg/dL — ABNORMAL HIGH (ref 0–99)
Triglycerides: 47 mg/dL (ref 0–149)
VLDL Cholesterol Cal: 9 mg/dL (ref 5–40)

## 2023-12-26 LAB — HEMOGLOBIN A1C
Est. average glucose Bld gHb Est-mCnc: 120 mg/dL
Hgb A1c MFr Bld: 5.8 % — ABNORMAL HIGH (ref 4.8–5.6)

## 2023-12-26 LAB — CYTOLOGY - NON PAP

## 2023-12-27 ENCOUNTER — Encounter: Payer: Self-pay | Admitting: Internal Medicine

## 2023-12-27 ENCOUNTER — Ambulatory Visit: Payer: Medicare HMO | Attending: Internal Medicine | Admitting: Internal Medicine

## 2023-12-27 VITALS — BP 110/68 | HR 76 | Ht 66.0 in | Wt 191.0 lb

## 2023-12-27 DIAGNOSIS — R600 Localized edema: Secondary | ICD-10-CM | POA: Diagnosis not present

## 2023-12-27 DIAGNOSIS — M3500 Sicca syndrome, unspecified: Secondary | ICD-10-CM

## 2023-12-27 DIAGNOSIS — E782 Mixed hyperlipidemia: Secondary | ICD-10-CM | POA: Diagnosis not present

## 2023-12-27 MED ORDER — ROSUVASTATIN CALCIUM 5 MG PO TABS: 5.0000 mg | ORAL_TABLET | Freq: Every day | ORAL | 3 refills | Status: DC

## 2023-12-27 NOTE — Progress Notes (Signed)
 Cardiology Office Note:    Date:  12/27/2023   ID:  [name] [name], DOB Feb 17, 1956, MRN [id number]  PCP:  Cleave Curling, MD   [facility] Providers Cardiologist:  Jann Melody, MD     Referring MD: Cleave Curling, MD   CC: CAC f/u  History of Present Illness:    [name] [name] is a 68 y.o. female with a hx of GERD, HLD,  hx of Sjogren's Syndrome, OSA on CPAP who presents for evaluation. 2023: had minimal CAC on CT. Normal echo 2024: Asymptomatic  [name] [name] is a 68 year old female with hyperlipidemia who presents for management of her condition.  She has hyperlipidemia with recent labs from December 25, 2023, indicating LDL levels above the target goal. She is asymptomatic with normal biventricular function and no evidence of right ventricular dysfunction despite her Sjogren's syndrome. She is currently taking red yeast rice as a supplement for her hyperlipidemia.  She experiences a 'pinch' sensation in her chest area approximately once a month, which resolves spontaneously. She is physically active, engaging in activities such as using the treadmill and working in the yard. However, she has not been consistently exercising since June of the previous year but plans to increase her activity level.  She reports discomfort in the bottom of her foot and two toes, which is exacerbated by wearing shoes but not by loose footwear. This issue may impact her ability to exercise, particularly on the treadmill.  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  No chest pain, breathing issues, palpitations, syncope, significant leg swelling, or passing out episodes.  Discussed the use of AI scribe software for clinical note transcription with the patient, who gave verbal consent to proceed.   Past Medical History:  Diagnosis Date   Abnormal liver enzymes    Acid reflux    Arthritis    back and neck   Bloating     Bronchitis    Chest pain, unspecified    Diarrhea    Dyspepsia    Encounter for Medicare annual wellness exam 06/13/2023   Esophageal reflux    H/O acute pancreatitis    H/O hiatal hernia    Headache(784.0)    migraines   Hemorrhage of rectum and anus    Hypercholesteremia    Nausea    Sjogren's disease (HCC)    Sleep apnea    Spinal stenosis, cervical region    Thyroid  disease    Thyroid  nodule     Past Surgical History:  Procedure Laterality Date   ABDOMINAL HYSTERECTOMY  2007   total with BSO   BIOPSY THYROID   02/16/2023   capsulorhexis Left 09/13/2023   (eye procedure)   CHOLECYSTECTOMY  1985   COLONOSCOPY     ECTOPIC PREGNANCY SURGERY  1984   ESOPHAGOGASTRODUODENOSCOPY     EUS  12/21/2011   Procedure: ESOPHAGEAL ENDOSCOPIC ULTRASOUND (EUS) RADIAL;  Surgeon: Evangeline Hilts, MD;  Location: [facility];  Service: Endoscopy;  Laterality: N/A;   eye cautery     EYE SURGERY     lasik   EYE SURGERY Left    cataract extraction    KNEE ARTHROSCOPY Right 06/11/2020   torn meniscus     Current Medications: Current Meds  Medication Sig   Apoaequorin (PREVAGEN) 10 MG CAPS Take by mouth daily at 6 (six) AM.   Ascorbic Acid (VITAMIN C) POWD as needed (immune support).   chlorpheniramine (ALLERGY  RELIEF) 4 MG tablet Take 4 mg by mouth 2 (two) times daily as needed for allergies.   Cholecalciferol (VITAMIN D3) 250 MCG (10000 UT) capsule Take 10,000 Units by mouth daily.   Cyanocobalamin  (VITAMIN B 12 PO) Take by mouth.   GINSENG CHINESE RED PO Take by mouth.   Glucosamine-Chondroitin-MSM 375-300-237.5 MG CAPS Take by mouth.   omeprazole  (PRILOSEC) 40 MG capsule Take 30-60 min before first meal of the day   OVER THE COUNTER MEDICATION Curamin- 2 tablets daily.   rosuvastatin  (CRESTOR ) 5 MG tablet Take 1 tablet (5 mg total) by mouth daily.   [DISCONTINUED] Red Yeast Rice Extract (RED YEAST RICE PO) Take 2 tablets by mouth daily.     Allergies:   Morphine and codeine,  Other, and Sulfa antibiotics   Social History   Socioeconomic History   Marital status: Married    Spouse name: Not on file   Number of children: 2   Years of education: Not on file   Highest education level: Bachelor's degree (e.g., BA, AB, BS)  Occupational History   Not on file  Tobacco Use   Smoking status: Never    Passive exposure: Past   Smokeless tobacco: Never  Vaping Use   Vaping status: Never Used  Substance and Sexual Activity   Alcohol use: Yes    Comment: 1-2 times a year   Drug use: No   Sexual activity: Yes    Birth control/protection: None    Comment: Hysterectomy  Other Topics Concern   Not on file  Social History Narrative   Married.   Drinks occasional sweet tea.   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   Social Drivers of Corporate investment banker Strain: Low Risk  (12/12/2023)   Overall Financial Resource Strain (CARDIA)    Difficulty of Paying Living Expenses: Not hard at all  Food Insecurity: No Food Insecurity (12/12/2023)   Hunger Vital Sign    Worried About Running Out of Food in the Last Year: Never true    Ran Out of Food in the Last Year: Never true  Transportation Needs: No Transportation Needs (12/12/2023)   PRAPARE - Administrator, Civil Service (Medical): No    Lack of Transportation (Non-Medical): No  Physical Activity: Insufficiently Active (12/12/2023)   Exercise Vital Sign    Days of Exercise per Week: 2 days    Minutes of Exercise per Session: 50 min  Stress: No Stress Concern Present (12/12/2023)   [employer] of Occupational Health - Occupational Stress Questionnaire    Feeling of Stress : Not at all  Social Connections: Socially Integrated (12/12/2023)   Social Connection and Isolation Panel [NHANES]    Frequency of Communication with Friends and Family: More than three times a week    Frequency of Social Gatherings with Friends and Family: Once a week    Attends Religious  Services: More than 4 times per year    Active Member of [employer] or Organizations: Yes    Attends Banker Meetings: 1 to 4 times per year    Marital Status: Married     Family History: The patient's family history includes Cancer in her brother and mother; Colon cancer in her mother; Early death in her brother, father, and mother; Healthy in her daughter and son; Hearing loss in her mother; Heart disease in her maternal grandfather; Hypertension in her maternal grandfather; Pancreatic cancer in her brother. Grandfather had cardiomegaly.  ROS:  Please see the history of present illness.     EKGs/Labs/Other Studies Reviewed:    The following studies were reviewed today:   Cardiac Studies & Procedures   ______________________________________________________________________________________________     ECHOCARDIOGRAM  ECHOCARDIOGRAM COMPLETE 07/13/2022  Narrative ECHOCARDIOGRAM REPORT    Patient Name:   SHEKERA BEAVERS Date of Exam: 07/13/2022 Medical Rec #:  [id number]       Height:       66.0 in Accession #:    [id number]      Weight:       195.4 lb Date of Birth:  25-Dec-1955      BSA:          1.980 m Patient Age:    65 years        BP:           128/74 mmHg Patient Gender: F               HR:           78 bpm. Exam Location:  [address]  Procedure: 2D Echo, 3D Echo, Cardiac Doppler, Color Doppler and Strain Analysis  Indications:    R00.2 Palpitation  History:        Patient has prior history of Echocardiogram examinations, most recent 04/15/2015. Risk Factors:Hypertension and Sleep Apnea. Sjogren's disease.  Sonographer:    Verena Glaser [facility], RDCS Referring Phys: [id number] [facility] A Sydnee Lamour  IMPRESSIONS   1. Left ventricular ejection fraction, by estimation, is 60 to 65%. The left ventricle has normal function. The left ventricle has no regional wall motion abnormalities. Left ventricular diastolic parameters were normal. The average left  ventricular global longitudinal strain is -27.0 %. The global longitudinal strain is normal. 2. Right ventricular systolic function is normal. The right ventricular size is normal. There is normal pulmonary artery systolic pressure. 3. The mitral valve is normal in structure. Trivial mitral valve regurgitation. No evidence of mitral stenosis. 4. The aortic valve is tricuspid. Aortic valve regurgitation is not visualized. Aortic valve sclerosis is present, with no evidence of aortic valve stenosis. 5. The inferior vena cava is normal in size with greater than 50% respiratory variability, suggesting right atrial pressure of 3 mmHg.  Comparison(s): No prior Echocardiogram. Report only - [facility] 04/15/15 EF 57%.  FINDINGS Left Ventricle: Left ventricular ejection fraction, by estimation, is 60 to 65%. The left ventricle has normal function. The left ventricle has no regional wall motion abnormalities. The average left ventricular global longitudinal strain is -27.0 %. The global longitudinal strain is normal. The left ventricular internal cavity size was normal in size. There is no left ventricular hypertrophy. Left ventricular diastolic parameters were normal.  Right Ventricle: The right ventricular size is normal. Right ventricular systolic function is normal. There is normal pulmonary artery systolic pressure. The tricuspid regurgitant velocity is 2.52 m/s, and with an assumed right atrial pressure of 3 mmHg, the estimated right ventricular systolic pressure is 28.4 mmHg.  Left Atrium: Left atrial size was normal in size.  Right Atrium: Right atrial size was normal in size.  Pericardium: There is no evidence of pericardial effusion.  Mitral Valve: The mitral valve is normal in structure. Trivial mitral valve regurgitation. No evidence of mitral valve stenosis.  Tricuspid Valve: The tricuspid valve is normal in structure. Tricuspid valve regurgitation is trivial. No evidence of  tricuspid stenosis.  Aortic Valve: The aortic valve is tricuspid. Aortic valve regurgitation is not visualized. Aortic valve sclerosis is present, with no evidence  of aortic valve stenosis.  Pulmonic Valve: The pulmonic valve was not well visualized. Pulmonic valve regurgitation is trivial. No evidence of pulmonic stenosis.  Aorta: The aortic root is normal in size and structure.  Venous: The inferior vena cava is normal in size with greater than 50% respiratory variability, suggesting right atrial pressure of 3 mmHg.  IAS/Shunts: No atrial level shunt detected by color flow Doppler.   LEFT VENTRICLE PLAX 2D LVIDd:         3.80 cm   Diastology LVIDs:         2.70 cm   LV e' medial:    11.70 cm/s LV PW:         1.00 cm   LV E/e' medial:  8.6 LV IVS:        1.10 cm   LV e' lateral:   16.00 cm/s LVOT diam:     2.70 cm   LV E/e' lateral: 6.3 LV SV:         123 LV SV Index:   62        2D Longitudinal Strain LVOT Area:     5.73 cm  2D Strain GLS (A2C):   -26.4 % 2D Strain GLS (A3C):   -29.2 % 2D Strain GLS (A4C):   -25.5 % 2D Strain GLS Avg:     -27.0 %  3D Volume EF: 3D EF:        63 % LV EDV:       114 ml LV ESV:       42 ml LV SV:        72 ml  RIGHT VENTRICLE             IVC RV Basal diam:  2.60 cm     IVC diam: 1.60 cm RV S prime:     17.40 cm/s TAPSE (M-mode): 2.5 cm RVSP:           28.4 mmHg  LEFT ATRIUM             Index        RIGHT ATRIUM           Index LA diam:        3.40 cm 1.72 cm/m   RA Pressure: 3.00 mmHg LA Vol (A2C):   42.0 ml 21.21 ml/m  RA Area:     12.60 cm LA Vol (A4C):   33.8 ml 17.07 ml/m  RA Volume:   27.30 ml  13.79 ml/m LA Biplane Vol: 38.1 ml 19.24 ml/m AORTIC VALVE LVOT Vmax:   111.00 cm/s LVOT Vmean:  74.800 cm/s LVOT VTI:    0.215 m  AORTA Ao Root diam: 3.50 cm Ao Asc diam:  2.90 cm  MITRAL VALVE                TRICUSPID VALVE TR Peak grad:   25.4 mmHg MV Decel Time: 183 msec     TR Vmax:        252.00 cm/s MV E velocity:  101.00 cm/s  Estimated RAP:  3.00 mmHg MV A velocity: 73.00 cm/s   RVSP:           28.4 mmHg MV E/A ratio:  1.38 SHUNTS Systemic VTI:  0.22 m Systemic Diam: 2.70 cm  Alexandria Angel MD Electronically signed by Alexandria Angel MD Signature Date/Time: 07/13/2022/4:29:53 PM    Final    MONITORS  LONG TERM MONITOR (3-14 DAYS) 08/02/2022  Narrative   Patient had a minimum heart rate  of 54 bpm, maximum heart rate of 154 bpm, and average heart rate of 79 bpm.   Predominant underlying rhythm was sinus rhythm.   One 4 beat run of NSVT. One 8 beat run of regular SVT.   Isolated PACs were rare (<1.0%).   Isolated PVCs were rare (<1.0%).   Triggered and diary events associated with sinus rhythm, sinus tachycardia, and rarely PVCs.  No malignant arrhythmias.   CT SCANS  CT CORONARY MORPH W/CTA COR W/SCORE 08/18/2022  Addendum 08/20/2022  2:49 PM ADDENDUM REPORT: 08/20/2022 14:47  CLINICAL DATA:  68 Year-old African American Female  EXAM: Cardiac/Coronary  CTA  TECHNIQUE: The patient was scanned on a [employer].  FINDINGS: Scan was triggered in the descending thoracic aorta. Axial non-contrast 3 mm slices were carried out through the heart. The data set was analyzed on a dedicated work station and scored using the Agatson method. Gantry rotation speed was 250 msecs and collimation was .6 mm. 0.8 mg of sl NTG was given. The 3D data set was reconstructed in 5% intervals of the 67-82 % of the R-R cycle. Diastolic phases were analyzed on a dedicated work station using MPR, MIP and VRT modes. The patient received 100 cc of contrast.  Coronary Arteries:  Normal coronary origin.  Right dominance.  Coronary Calcium  Score:  Left main: 0  Left anterior descending artery: 7  Left circumflex artery: 0  Right coronary artery: 1  Total: 8  Percentile: 69th for age, sex, and race matched control.  RCA is a large dominant artery that gives rise to marginal, PDA  and PLA. Minimal calcified plaque (< 25%) mid RCA.  Left main is a large artery that gives rise to LAD and LCX arteries. There is no significant plaque.  LAD is a large vessel that gives rise to multiple diagonal vessels. Minimal calcified plaque (< 25%) proximal LAD.  LCX is a non-dominant artery that gives rise to one large, branching OM1. There is no significant plaque.  Other findings:  Aorta: Normal size.  No calcifications.  No dissection.  Main Pulmonary Artery: Normal size of the pulmonary artery.  Systemic Veins: Normal drainage  Aortic Valve:  Tri-leaflet.  No calcifications.  Mitral valve: No calcifications.  Normal pulmonary vein drainage into the left atrium.  Normal left atrial appendage without a thrombus.  Small left atrial diverticulum noted.  Additionally small PFO noted seen on interatrial septum cine.  The above is normal variant anatomy.  Left Ventricle: Normal size  Left Atrium: Normal size  Right Ventricle: Normal size  Right Atrium: Normal size  Pericardium: Normal thickness  Extra-cardiac findings: See attached radiology report for non-cardiac structures.  Artifact: Stair step cardiac motion artifact  IMPRESSION: 1. Coronary calcium  score of 8. This was 69th percentile for age, sex, and race matched control.  2. Normal coronary origin with Right dominance.  3. CAD-RADS 1. Minimal non-obstructive CAD (1-24%). Consider non-atherosclerotic causes of chest pain. Consider preventive therapy and risk factor modification.  RECOMMENDATIONS: RECOMMENDATIONS The proposed cut-off value of 1,651 AU yielded a 93 % sensitivity and 75 % specificity in grading AS severity in patients with classical low-flow, low-gradient AS. Proposed different cut-off values to define severe AS for men and women as 2,065 AU and 1,274 AU, respectively. The joint European and American recommendations for the assessment of AS consider the aortic valve calcium   score as a continuum - a very high calcium  score suggests severe AS and a low calcium  score suggests severe AS is  unlikely.  [name] [name], et al. 2017 ESC/EACTS Guidelines for the management of valvular heart disease. Eur Heart J 2017;38:2739-91.  Coronary artery calcium  (CAC) score is a strong predictor of incident coronary heart disease (CHD) and provides predictive information beyond traditional risk factors. CAC scoring is reasonable to use in the decision to withhold, postpone, or initiate statin therapy in intermediate-risk or selected borderline-risk asymptomatic adults (age 3-75 years and LDL-C >=70 to <190 mg/dL) who do not have diabetes or established atherosclerotic cardiovascular disease (ASCVD).* In intermediate-risk (10-year ASCVD risk >=7.5% to <20%) adults or selected borderline-risk (10-year ASCVD risk >=5% to <7.5%) adults in whom a CAC score is measured for the purpose of making a treatment decision the following recommendations have been made:  If CAC = 0, it is reasonable to withhold statin therapy and reassess in 5 to 10 years, as long as higher risk conditions are absent (diabetes mellitus, family history of premature CHD in first degree relatives (males <55 years; females <65 years), cigarette smoking, LDL >=190 mg/dL or other independent risk factors).  If CAC is 1 to 99, it is reasonable to initiate statin therapy for patients >=12 years of age.  If CAC is >=100 or >=75th percentile, it is reasonable to initiate statin therapy at any age.  Cardiology referral should be considered for patients with CAC scores =400 or >=75th percentile.  *2018 AHA/ACC/AACVPR/AAPA/ABC/ACPM/ADA/AGS/APhA/ASPC/NLA/PCNA Guideline on the Management of Blood Cholesterol: A Report of the American College of Cardiology/American Heart Association Task Force on Clinical Practice Guidelines. J Am Coll Cardiol. 2019;73(24):3168-3209.  Gloriann Larger,  MD   Electronically Signed By: Gloriann Larger M.D. On: 08/20/2022 14:47  Narrative EXAM: OVER-READ INTERPRETATION  CT CHEST  The following report is a limited chest CT over-read performed by radiologist Dr. Marcos Sevin [facility] on 08/18/2022. This over-read does not include interpretation of cardiac or coronary anatomy or pathology. The coronary CTA interpretation by the cardiologist is attached.  COMPARISON:  Chest radiographs 06/30/2022.  FINDINGS: Mediastinum/Nodes: No enlarged lymph nodes within the visualized mediastinum.  Lungs/Pleura: There is no pleural effusion. Mild dependent atelectasis at both lung bases.  Upper abdomen: No significant findings in the visualized upper abdomen.  Musculoskeletal/Chest wall: No chest wall mass or suspicious osseous findings within the visualized chest. Mild thoracic spondylosis.  IMPRESSION: No significant extracardiac findings within the visualized chest.  Electronically Signed: By: Elmon Hagedorn M.D. On: 08/18/2022 15:40     ______________________________________________________________________________________________      Recent Labs: 09/26/2023: Hemoglobin 13.9; Platelets 240 09/28/2023: TSH 0.98 12/25/2023: ALT 18; BUN 12; Creatinine, Ser 0.86; Potassium 4.4; Sodium 142  Recent Lipid Panel    Component Value Date/Time   CHOL 201 (H) 12/25/2023 0905   TRIG 47 12/25/2023 0905   HDL 66 12/25/2023 0905   CHOLHDL 3.0 12/25/2023 0905   CHOLHDL 3.2 05/21/2021 0825   VLDL 10 10/25/2011 0330   LDLCALC 126 (H) 12/25/2023 0905   LDLCALC 141 (H) 05/21/2021 0825   LDLDIRECT 149 (H) 11/03/2022 0937       Physical Exam:    VS:  BP 110/68 (BP Location: Left Arm)   Pulse 76   Ht 5\' 6"  (1.676 m)   Wt 86.6 kg   SpO2 97%   BMI 30.83 kg/m     Wt Readings from Last 3 Encounters:  12/27/23 86.6 kg  12/19/23 87.5 kg  12/12/23 86.3 kg    GEN:  Well nourished, well developed in no acute  distress HEENT:  Normal NECK: No JVD CARDIAC: RRR No murmur. +2 radial and pedal pulses RESPIRATORY:  Clear to auscultation without rales, wheezing or rhonchi  ABDOMEN: Soft, non-tender, non-distended MUSCULOSKELETAL:  No edema SKIN: Warm and dry NEUROLOGIC:  Alert and oriented x 3 PSYCHIATRIC:  Normal affect   ASSESSMENT:    1. Mixed hyperlipidemia   2. Sjogren's syndrome, with unspecified organ involvement (HCC)   3. Bilateral lower extremity edema     PLAN:    Minimal non obstructive CAD HLD - Elevated cholesterol levels with a coronary artery calcium  score higher than 60% of peers. Emphasized aggressive cholesterol management. Explained rosuvastatin  as a natural derivative of lovastatin from red yeast rice. Discussed potential muscle aches as a common statin side effect, noting muscle pain is often due to increased exercise. Consider PCSK9 inhibitors or zetia if rosuvastatin  is not tolerated. - Start rosuvastatin  - Discontinue red yeast rice - Order labs in 3 months to monitor cholesterol levels  Sjogren's syndrome Subjective LE edema  - no evidence of RV dysfunction on prior echo; none on exam - will get BNP given risk factors for PH  OSA - CPAP is recommended  One year with APP Two year with me unless LDL is at target and asymptomatic       Medication Adjustments/Labs and Tests Ordered: Current medicines are reviewed at length with the patient today.  Concerns regarding medicines are outlined above.  Orders Placed This Encounter  Procedures   Pro b natriuretic peptide (BNP)   Lipid panel   ALT   Meds ordered this encounter  Medications   rosuvastatin  (CRESTOR ) 5 MG tablet    Sig: Take 1 tablet (5 mg total) by mouth daily.    Dispense:  90 tablet    Refill:  3    There are no Patient Instructions on file for this visit.   Signed, Jann Melody, MD  12/27/2023 8:58 AM    [facility]

## 2023-12-27 NOTE — Patient Instructions (Signed)
 Medication Instructions:  Your physician has recommended you make the following change in your medication:  STOP: Red Yeast Rice START: rosuvastatin  (Crestor ) 5 mg by mouth once daily  *If you need a refill on your cardiac medications before your next appointment, please call your pharmacy*  Lab Work: IN 3 MONTHS at any [facility]: Fasting lipid panel, ALT, and BNP (nothing to eat or drink except water or black coffee 12 hours prior)  If you have labs (blood work) drawn today and your tests are completely normal, you will receive your results only by: MyChart Message (if you have MyChart) OR A paper copy in the mail If you have any lab test that is abnormal or we need to change your treatment, we will call you to review the results.  Testing/Procedures: NONE  Follow-Up: At [facility], you and your health needs are our priority.  As part of our continuing mission to provide you with exceptional heart care, our providers are all part of one team.  This team includes your primary Cardiologist (physician) and Advanced Practice Providers or APPs (Physician Assistants and Nurse Practitioners) who all work together to provide you with the care you need, when you need it.  Your next appointment:   1 year(s)  Provider:   Lovette Rud, PA-C, Dayna Dunn, PA-C, Charles Connor, NP, Slater Duncan, NP, Marlyse Single, PA-C, or Leala Prince, PA-C     Other Instructions       1st Floor: - Lobby - Registration  - Pharmacy  - Lab - Cafe  2nd Floor: - PV Lab - Diagnostic Testing (echo, CT, nuclear med)  3rd Floor: - Vacant  4th Floor: - TCTS (cardiothoracic surgery) - AFib Clinic - Structural Heart Clinic - Vascular Surgery  - Vascular Ultrasound  5th Floor: - HeartCare Cardiology (general and EP) - Clinical Pharmacy for coumadin, hypertension, lipid, weight-loss medications, and med management appointments    Valet parking services will be available as well.

## 2023-12-29 ENCOUNTER — Other Ambulatory Visit: Payer: Self-pay | Admitting: Obstetrics and Gynecology

## 2023-12-29 DIAGNOSIS — R928 Other abnormal and inconclusive findings on diagnostic imaging of breast: Secondary | ICD-10-CM

## 2024-01-01 ENCOUNTER — Ambulatory Visit
Admission: RE | Admit: 2024-01-01 | Discharge: 2024-01-01 | Disposition: A | Discharge status: Home / Self Care | Source: Ambulatory Visit | Attending: Obstetrics and Gynecology | Admitting: Obstetrics and Gynecology

## 2024-01-01 ENCOUNTER — Ambulatory Visit

## 2024-01-01 DIAGNOSIS — R928 Other abnormal and inconclusive findings on diagnostic imaging of breast: Secondary | ICD-10-CM

## 2024-01-01 DIAGNOSIS — R922 Inconclusive mammogram: Secondary | ICD-10-CM | POA: Diagnosis not present

## 2024-01-01 DIAGNOSIS — R92331 Mammographic heterogeneous density, right breast: Secondary | ICD-10-CM | POA: Diagnosis not present

## 2024-01-10 ENCOUNTER — Other Ambulatory Visit

## 2024-01-10 ENCOUNTER — Encounter

## 2024-01-10 DIAGNOSIS — H04123 Dry eye syndrome of bilateral lacrimal glands: Secondary | ICD-10-CM | POA: Diagnosis not present

## 2024-01-10 DIAGNOSIS — H2511 Age-related nuclear cataract, right eye: Secondary | ICD-10-CM | POA: Diagnosis not present

## 2024-01-10 DIAGNOSIS — H26492 Other secondary cataract, left eye: Secondary | ICD-10-CM | POA: Diagnosis not present

## 2024-01-10 DIAGNOSIS — H43812 Vitreous degeneration, left eye: Secondary | ICD-10-CM | POA: Diagnosis not present

## 2024-02-01 ENCOUNTER — Other Ambulatory Visit: Payer: Medicare HMO

## 2024-02-12 NOTE — Progress Notes (Unsigned)
 Office Visit Note  Patient: [name] [name]             Date of Birth: [birth year]           MRN: [id number]             PCP: Cleave Curling, MD Referring: Cleave Curling, MD Visit Date: 02/26/2024 Occupation: @GUAROCC @  Subjective:    History of Present Illness: [name] [name] is a 68 y.o. female with history of sjogren's syndrome and osteoarthritis.  Patient is not currently taking any immunosuppressive agents.    Lab work from 09/26/23: SPEP did not reveal any abnormal protein bands, RF negative, complements within normal limits, ESR within normal limits, UA normal, CBC and CMP WNL.   Activities of Daily Living:  Patient reports morning stiffness for *** {minute/hour:19697}.   Patient {ACTIONS;DENIES/REPORTS:21021675::Denies} nocturnal pain.  Difficulty dressing/grooming: {ACTIONS;DENIES/REPORTS:21021675::Denies} Difficulty climbing stairs: {ACTIONS;DENIES/REPORTS:21021675::Denies} Difficulty getting out of chair: {ACTIONS;DENIES/REPORTS:21021675::Denies} Difficulty using hands for taps, buttons, cutlery, and/or writing: {ACTIONS;DENIES/REPORTS:21021675::Denies}  No Rheumatology ROS completed.   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   Osteoarthritis 06/07/2023   Other cervical disc displacement, unspecified cervical region 06/07/2023   Other cervical disc degeneration, unspecified cervical region 06/07/2023   Other spondylosis with radiculopathy, cervical region 06/07/2023   Paresthesia of left arm 06/04/2023   Upper airway cough syndrome 05/05/2023   Coronary artery calcification 11/03/2022   Sjogren's syndrome (HCC) 07/01/2022   Acute pain of left shoulder 04/23/2022   Soft tissue  mass 04/23/2022   Postnasal drip 04/23/2022   Class 1 obesity due to excess calories without serious comorbidity with body mass index (BMI) of 30.0 to 30.9 in adult 04/23/2022   Dysfunction of both eustachian tubes 05/24/2021   Primary osteoarthritis of both knees 07/30/2018   Primary osteoarthritis of both hands 07/30/2018   Foot pain 05/15/2018   OSA (obstructive sleep apnea) 02/23/2017   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   Snoring 03/25/2015   Hypersomnia with sleep apnea 03/25/2015   Body mass index (BMI) of 30.0-30.9 in adult 02/20/2015   Bilateral dry eyes 10/30/2013   MGD (meibomian gland disease) 10/30/2013   Nuclear sclerosis of both eyes 10/30/2013   Multinodular goiter (nontoxic) 02/08/2012   Acute pancreatitis 10/26/2011   Nausea and vomiting in adult 10/26/2011   Dehydration 10/26/2011   Gastroenteritis 10/26/2011   Hypokalemia 10/26/2011   Elevated liver function tests 10/26/2011   Acid reflux    Thyroid  nodule    Abdominal bloating 07/08/2011    Past Medical History:  Diagnosis Date   Abnormal liver enzymes    Acid reflux    Arthritis    back and neck   Bloating    Bronchitis    Chest pain, unspecified    Diarrhea    Dyspepsia    Encounter for Medicare annual wellness exam 06/13/2023   Esophageal reflux    H/O acute pancreatitis    H/O hiatal hernia    Headache(784.0)    migraines   Hemorrhage of rectum and anus    Hypercholesteremia    Nausea    Sjogren's disease (HCC)    Sleep apnea    Spinal stenosis, cervical region    Thyroid  disease    Thyroid  nodule  Family History  Problem Relation Age of Onset   Colon cancer Mother    Cancer Mother    Early death Mother    Hearing loss Mother    Early death Father    Pancreatic cancer Brother    Healthy Daughter    Healthy Son    Heart disease Maternal Grandfather    Hypertension Maternal Grandfather    Cancer Brother    Early death Brother    Past Surgical History:  Procedure Laterality Date    ABDOMINAL HYSTERECTOMY  2007   total with BSO   BIOPSY THYROID   02/16/2023   capsulorhexis Left 09/13/2023   (eye procedure)   CHOLECYSTECTOMY  1985   COLONOSCOPY     ECTOPIC PREGNANCY SURGERY  1984   ESOPHAGOGASTRODUODENOSCOPY     EUS  12/21/2011   Procedure: ESOPHAGEAL ENDOSCOPIC ULTRASOUND (EUS) RADIAL;  Surgeon: Evangeline Hilts, MD;  Location: [facility];  Service: Endoscopy;  Laterality: N/A;   eye cautery     EYE SURGERY     lasik   EYE SURGERY Left    cataract extraction    KNEE ARTHROSCOPY Right 06/11/2020   torn meniscus    Social History   Social History Narrative   Married.   Drinks occasional sweet tea.   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   Immunization History  Administered Date(s) Administered   Fluad Trivalent(High Dose 65+) 05/16/2023   Influenza,inj,Quad PF,6+ Mos 06/24/2022   Influenza-Unspecified 09/01/2022   PFIZER(Purple Top)SARS-COV-2 Vaccination 11/22/2019, 12/13/2019, 07/01/2020, 03/26/2021   Tdap 07/13/2017   Zoster Recombinant(Shingrix ) 02/15/2022, 05/10/2022     Objective: Vital Signs: There were no vitals taken for this visit.   Physical Exam Vitals and nursing note reviewed.  Constitutional:      Appearance: She is well-developed.  HENT:     Head: Normocephalic and atraumatic.   Eyes:     Conjunctiva/sclera: Conjunctivae normal.    Cardiovascular:     Rate and Rhythm: Normal rate and regular rhythm.     Heart sounds: Normal heart sounds.  Pulmonary:     Effort: Pulmonary effort is normal.     Breath sounds: Normal breath sounds.  Abdominal:     General: Bowel sounds are normal.     Palpations: Abdomen is soft.   Musculoskeletal:     Cervical back: Normal range of motion.  Lymphadenopathy:     Cervical: No cervical adenopathy.   Skin:    General: Skin is warm and dry.     Capillary Refill: Capillary refill takes less than 2 seconds.   Neurological:     Mental Status: She is alert  and oriented to person, place, and time.   Psychiatric:        Behavior: Behavior normal.      Musculoskeletal Exam: ***  CDAI Exam: CDAI Score: -- Patient Global: --; Provider Global: -- Swollen: --; Tender: -- Joint Exam 02/26/2024   No joint exam has been documented for this visit   There is currently no information documented on the homunculus. Go to the Rheumatology activity and complete the homunculus joint exam.  Investigation: No additional findings.  Imaging: No results found.  Recent Labs: Lab Results  Component Value Date   WBC 5.3 09/26/2023   HGB 13.9 09/26/2023   PLT 240 09/26/2023   NA 142 12/25/2023   K 4.4 12/25/2023   CL 105 12/25/2023   CO2 23 12/25/2023   GLUCOSE 88 12/25/2023   BUN 12 12/25/2023  CREATININE 0.86 12/25/2023   BILITOT 0.8 12/25/2023   ALKPHOS 110 12/25/2023   AST 20 12/25/2023   ALT 18 12/25/2023   PROT 6.8 12/25/2023   ALBUMIN 4.6 12/25/2023   CALCIUM  9.6 12/25/2023   GFRAA 88 09/29/2020    Speciality Comments: PLQ eye exam: 08/27/2019 normal. [facility]. Follow up in 6 months. PLQ started 07/2019  Procedures:  No procedures performed Allergies: Morphine and codeine, Other, and Sulfa antibiotics   Assessment / Plan:     Visit Diagnoses: Sjogren's syndrome with other organ involvement (HCC)  High risk medication use  Antineutrophil cytoplasmic antibody (ANCA) positive  Primary osteoarthritis of both hands  Effusion, right knee  Primary osteoarthritis of both knees  History of sleep apnea  History of thyroid  disease  Other fatigue  Chronic cough  Orders: No orders of the defined types were placed in this encounter.  No orders of the defined types were placed in this encounter.   Face-to-face time spent with patient was *** minutes. Greater than 50% of time was spent in counseling and coordination of care.  Follow-Up Instructions: No follow-ups on file.   Romayne Clubs, PA-C  Note - This  record has been created using Dragon software.  Chart creation errors have been sought, but may not always  have been located. Such creation errors do not reflect on  the standard of medical care.

## 2024-02-26 ENCOUNTER — Ambulatory Visit: Payer: Medicare HMO | Attending: Physician Assistant | Admitting: Physician Assistant

## 2024-02-26 ENCOUNTER — Encounter: Payer: Self-pay | Admitting: Physician Assistant

## 2024-02-26 VITALS — BP 122/80 | HR 66 | Resp 14 | Ht 66.5 in | Wt 192.0 lb

## 2024-02-26 DIAGNOSIS — M25461 Effusion, right knee: Secondary | ICD-10-CM

## 2024-02-26 DIAGNOSIS — R768 Other specified abnormal immunological findings in serum: Secondary | ICD-10-CM | POA: Diagnosis not present

## 2024-02-26 DIAGNOSIS — M170 Bilateral primary osteoarthritis of knee: Secondary | ICD-10-CM

## 2024-02-26 DIAGNOSIS — M19041 Primary osteoarthritis, right hand: Secondary | ICD-10-CM

## 2024-02-26 DIAGNOSIS — R053 Chronic cough: Secondary | ICD-10-CM | POA: Diagnosis not present

## 2024-02-26 DIAGNOSIS — Z8639 Personal history of other endocrine, nutritional and metabolic disease: Secondary | ICD-10-CM

## 2024-02-26 DIAGNOSIS — M19042 Primary osteoarthritis, left hand: Secondary | ICD-10-CM

## 2024-02-26 DIAGNOSIS — Z79899 Other long term (current) drug therapy: Secondary | ICD-10-CM | POA: Diagnosis not present

## 2024-02-26 DIAGNOSIS — R5383 Other fatigue: Secondary | ICD-10-CM

## 2024-02-26 DIAGNOSIS — M3509 Sicca syndrome with other organ involvement: Secondary | ICD-10-CM

## 2024-02-26 DIAGNOSIS — Z8669 Personal history of other diseases of the nervous system and sense organs: Secondary | ICD-10-CM

## 2024-02-26 NOTE — Patient Instructions (Signed)
 Knee Exercises Ask your health care provider which exercises are safe for you. Do exercises exactly as told by your health care provider and adjust them as directed. It is normal to feel mild stretching, pulling, tightness, or discomfort as you do these exercises. Stop right away if you feel sudden pain or your pain gets worse. Do not begin these exercises until told by your health care provider. Stretching and range-of-motion exercises These exercises warm up your muscles and joints and improve the movement and flexibility of your knee. These exercises also help to relieve pain and swelling. Knee extension, prone  Lie on your abdomen (prone position) on a bed. Place your left / right knee just beyond the edge of the surface so your knee is not on the bed. You can put a towel under your left / right thigh just above your kneecap for comfort. Relax your leg muscles and allow gravity to straighten your knee (extension). You should feel a stretch behind your left / right knee. Hold this position for __________ seconds. Scoot up so your knee is supported between repetitions. Repeat __________ times. Complete this exercise __________ times a day. Knee flexion, active  Lie on your back with both legs straight. If this causes back discomfort, bend your left / right knee so your foot is flat on the floor. Slowly slide your left / right heel back toward your buttocks. Stop when you feel a gentle stretch in the front of your knee or thigh (flexion). Hold this position for __________ seconds. Slowly slide your left / right heel back to the starting position. Repeat __________ times. Complete this exercise __________ times a day. Quadriceps stretch, prone  Lie on your abdomen on a firm surface, such as a bed or padded floor. Bend your left / right knee and hold your ankle. If you cannot reach your ankle or pant leg, loop a belt around your foot and grab the belt instead. Gently pull your heel toward your  buttocks. Your knee should not slide out to the side. You should feel a stretch in the front of your thigh and knee (quadriceps). Hold this position for __________ seconds. Repeat __________ times. Complete this exercise __________ times a day. Hamstring, supine  Lie on your back (supine position). Loop a belt or towel over the ball of your left / right foot. The ball of your foot is on the walking surface, right under your toes. Straighten your left / right knee and slowly pull on the belt to raise your leg until you feel a gentle stretch behind your knee (hamstring). Do not let your knee bend while you do this. Keep your other leg flat on the floor. Hold this position for __________ seconds. Repeat __________ times. Complete this exercise __________ times a day. Strengthening exercises These exercises build strength and endurance in your knee. Endurance is the ability to use your muscles for a long time, even after they get tired. Quadriceps, isometric This exercise strengthens the muscles in front of your thigh (quadriceps) without moving your knee joint (isometric). Lie on your back with your left / right leg extended and your other knee bent. Put a rolled towel or small pillow under your knee if told by your health care provider. Slowly tense the muscles in the front of your left / right thigh. You should see your kneecap slide up toward your hip or see increased dimpling just above the knee. This motion will push the back of the knee toward the floor.  For __________ seconds, hold the muscle as tight as you can without increasing your pain. Relax the muscles slowly and completely. Repeat __________ times. Complete this exercise __________ times a day. Straight leg raises This exercise strengthens the muscles in front of your thigh (quadriceps) and the muscles that move your hips (hip flexors). Lie on your back with your left / right leg extended and your other knee bent. Tense the  muscles in the front of your left / right thigh. You should see your kneecap slide up or see increased dimpling just above the knee. Your thigh may even shake a bit. Keep these muscles tight as you raise your leg 4-6 inches (10-15 cm) off the floor. Do not let your knee bend. Hold this position for __________ seconds. Keep these muscles tense as you lower your leg. Relax your muscles slowly and completely after each repetition. Repeat __________ times. Complete this exercise __________ times a day. Hamstring, isometric  Lie on your back on a firm surface. Bend your left / right knee about __________ degrees. Dig your left / right heel into the surface as if you are trying to pull it toward your buttocks. Tighten the muscles in the back of your thighs (hamstring) to "dig" as hard as you can without increasing any pain. Hold this position for __________ seconds. Release the tension gradually and allow your muscles to relax completely for __________ seconds after each repetition. Repeat __________ times. Complete this exercise __________ times a day. Hamstring curls If told by your health care provider, do this exercise while wearing ankle weights. Begin with __________lb / kg weights. Then increase the weight by 1 lb (0.5 kg) increments. Do not wear ankle weights that are more than __________lb / kg. Lie on your abdomen with your legs straight. Bend your left / right knee as far as you can without feeling pain. Keep your hips flat against the floor. Hold this position for __________ seconds. Slowly lower your leg to the starting position. Repeat __________ times. Complete this exercise __________ times a day. Squats This exercise strengthens the muscles in front of your thigh and knee (quadriceps). Stand in front of a table, with your feet and knees pointing straight ahead. You may rest your hands on the table for balance but not for support. Slowly bend your knees and lower your hips like you  are going to sit in a chair. Keep your weight over your heels, not over your toes. Keep your lower legs upright so they are parallel with the table legs. Do not let your hips go lower than your knees. Do not bend lower than told by your health care provider. If your knee pain increases, do not bend as low. Hold the squat position for __________ seconds. Slowly push with your legs to return to standing. Do not use your hands to pull yourself to standing. Repeat __________ times. Complete this exercise __________ times a day. Wall slides This exercise strengthens the muscles in front of your thigh and knee (quadriceps). Lean your back against a smooth wall or door, and walk your feet out 18-24 inches (46-61 cm) from it. Place your feet hip-width apart. Slowly slide down the wall or door until your knees bend __________ degrees. Keep your knees over your heels, not over your toes. Keep your knees in line with your hips. Hold this position for __________ seconds. Repeat __________ times. Complete this exercise __________ times a day. Straight leg raises, side-lying This exercise strengthens the muscles that rotate  the leg at the hip and move it away from your body (hip abductors). Lie on your side with your left / right leg in the top position. Lie so your head, shoulder, knee, and hip line up. You may bend your bottom knee to help you keep your balance. Roll your hips slightly forward so your hips are stacked directly over each other and your left / right knee is facing forward. Leading with your heel, lift your top leg 4-6 inches (10-15 cm). You should feel the muscles in your outer hip lifting. Do not let your foot drift forward. Do not let your knee roll toward the ceiling. Hold this position for __________ seconds. Slowly return your leg to the starting position. Let your muscles relax completely after each repetition. Repeat __________ times. Complete this exercise __________ times a  day. Straight leg raises, prone This exercise stretches the muscles that move your hips away from the front of the pelvis (hip extensors). Lie on your abdomen on a firm surface. You can put a pillow under your hips if that is more comfortable. Tense the muscles in your buttocks and lift your left / right leg about 4-6 inches (10-15 cm). Keep your knee straight as you lift your leg. Hold this position for __________ seconds. Slowly lower your leg to the starting position. Let your leg relax completely after each repetition. Repeat __________ times. Complete this exercise __________ times a day. This information is not intended to replace advice given to you by your health care provider. Make sure you discuss any questions you have with your health care provider. Document Revised: 05/04/2021 Document Reviewed: 05/04/2021 Elsevier Patient Education  2024 [employer].

## 2024-02-27 ENCOUNTER — Ambulatory Visit: Payer: Self-pay | Admitting: Physician Assistant

## 2024-02-27 NOTE — Progress Notes (Signed)
CBC and CMP WNL.  ESR WNL.  Complements WNL.  UA normal.

## 2024-02-28 LAB — CBC WITH DIFFERENTIAL/PLATELET
Absolute Lymphocytes: 1513 {cells}/uL (ref 850–3900)
Absolute Monocytes: 395 {cells}/uL (ref 200–950)
Basophils Absolute: 31 {cells}/uL (ref 0–200)
Basophils Relative: 0.6 %
Eosinophils Absolute: 213 {cells}/uL (ref 15–500)
Eosinophils Relative: 4.1 %
HCT: 42.5 % (ref 35.0–45.0)
Hemoglobin: 13.9 g/dL (ref 11.7–15.5)
MCH: 28.6 pg (ref 27.0–33.0)
MCHC: 32.7 g/dL (ref 32.0–36.0)
MCV: 87.4 fL (ref 80.0–100.0)
MPV: 10.8 fL (ref 7.5–12.5)
Monocytes Relative: 7.6 %
Neutro Abs: 3047 {cells}/uL (ref 1500–7800)
Neutrophils Relative %: 58.6 %
Platelets: 220 10*3/uL (ref 140–400)
RBC: 4.86 10*6/uL (ref 3.80–5.10)
RDW: 13.4 % (ref 11.0–15.0)
Total Lymphocyte: 29.1 %
WBC: 5.2 10*3/uL (ref 3.8–10.8)

## 2024-02-28 LAB — COMPREHENSIVE METABOLIC PANEL WITH GFR
AG Ratio: 2.1 (calc) (ref 1.0–2.5)
ALT: 16 U/L (ref 6–29)
AST: 15 U/L (ref 10–35)
Albumin: 5 g/dL (ref 3.6–5.1)
Alkaline phosphatase (APISO): 93 U/L (ref 37–153)
BUN: 13 mg/dL (ref 7–25)
CO2: 28 mmol/L (ref 20–32)
Calcium: 9.8 mg/dL (ref 8.6–10.4)
Chloride: 107 mmol/L (ref 98–110)
Creat: 0.71 mg/dL (ref 0.50–1.05)
Globulin: 2.4 g/dL (ref 1.9–3.7)
Glucose, Bld: 90 mg/dL (ref 65–99)
Potassium: 4.3 mmol/L (ref 3.5–5.3)
Sodium: 141 mmol/L (ref 135–146)
Total Bilirubin: 1.2 mg/dL (ref 0.2–1.2)
Total Protein: 7.4 g/dL (ref 6.1–8.1)
eGFR: 94 mL/min/{1.73_m2} (ref >=60)

## 2024-02-28 LAB — URINALYSIS, ROUTINE W REFLEX MICROSCOPIC
Bilirubin Urine: NEGATIVE
Glucose, UA: NEGATIVE
Hgb urine dipstick: NEGATIVE
Ketones, ur: NEGATIVE
Leukocytes,Ua: NEGATIVE
Nitrite: NEGATIVE
Protein, ur: NEGATIVE
Specific Gravity, Urine: 1.007 (ref 1.001–1.035)
pH: 5.5 (ref 5.0–8.0)

## 2024-02-28 LAB — PROTEIN ELECTROPHORESIS, SERUM, WITH REFLEX
Albumin ELP: 4.7 g/dL (ref 3.8–4.8)
Alpha 1: 0.4 g/dL — ABNORMAL HIGH (ref 0.2–0.3)
Alpha 2: 0.6 g/dL (ref 0.5–0.9)
Beta 2: 0.4 g/dL (ref 0.2–0.5)
Beta Globulin: 0.5 g/dL (ref 0.4–0.6)
Gamma Globulin: 0.9 g/dL (ref 0.8–1.7)
Total Protein: 7.4 g/dL (ref 6.1–8.1)

## 2024-02-28 LAB — ANA: Anti Nuclear Antibody (ANA): POSITIVE — AB

## 2024-02-28 LAB — C3 AND C4
C3 Complement: 126 mg/dL (ref 83–193)
C4 Complement: 37 mg/dL (ref 15–57)

## 2024-02-28 LAB — ANTI-NUCLEAR AB-TITER (ANA TITER): ANA Titer 1: 1:40 {titer} — ABNORMAL HIGH

## 2024-02-28 LAB — SEDIMENTATION RATE: Sed Rate: 2 mm/h (ref 0–30)

## 2024-02-28 LAB — SJOGRENS SYNDROME-A EXTRACTABLE NUCLEAR ANTIBODY: SSA (Ro) (ENA) Antibody, IgG: >8 AI — AB

## 2024-02-28 NOTE — Progress Notes (Signed)
Ro antibody remains positive.

## 2024-02-28 NOTE — Progress Notes (Signed)
ANA remains positive--very low titer.

## 2024-02-29 NOTE — Progress Notes (Signed)
SPEP no abnormal protein bands.

## 2024-03-11 ENCOUNTER — Ambulatory Visit: Admitting: Neurology

## 2024-03-11 ENCOUNTER — Encounter: Payer: Self-pay | Admitting: Neurology

## 2024-03-11 VITALS — BP 129/79 | HR 75 | Ht 66.0 in | Wt 191.0 lb

## 2024-03-11 DIAGNOSIS — K219 Gastro-esophageal reflux disease without esophagitis: Secondary | ICD-10-CM | POA: Diagnosis not present

## 2024-03-11 DIAGNOSIS — G4710 Hypersomnia, unspecified: Secondary | ICD-10-CM | POA: Diagnosis not present

## 2024-03-11 DIAGNOSIS — G4730 Sleep apnea, unspecified: Secondary | ICD-10-CM

## 2024-03-11 DIAGNOSIS — M3505 Sjogren syndrome with inflammatory arthritis: Secondary | ICD-10-CM

## 2024-03-11 DIAGNOSIS — E042 Nontoxic multinodular goiter: Secondary | ICD-10-CM | POA: Diagnosis not present

## 2024-03-11 NOTE — Patient Instructions (Signed)
   Plan;  Go to bed a little earlier and add an hour of sleep to the total sleep time.   We will check your AHI by a new sleep test, a HST.  I ordered a watch pat.   Continue treatment for GERD if needed.   Get an abdominal CT to rule out AAA.  The patient  described a flatter sensation in the  lower back , mid spine, and rare RLS symptoms but has no neuropathy.  She advised me that she had an abdominal CT  08-19-2023, which I reviewed.    I plan to follow up either personally or through our NP within 4-5 months.

## 2024-03-11 NOTE — Progress Notes (Addendum)
 SLEEP MEDICINE CLINIC    Provider:  Dedra Gores, MD  Primary Care Physician:  Jarold Medici, MD [address] [place] [address]     Referring Provider: Jarold Medici, Md [address] [place],  [place] [address]          Chief Complaint according to patient   Patient presents with:     New Patient (Initial Visit)     Here to reestablish care for sleep at [facility]. She had a PSG in 2016 and was started on CPAP.  GERD was bad at the time, too . States that she stopped using PAP  because she would get a lot of respiratory issues , was getting sick with it. She states she was cleaning and everything but she found that she had a increase in mucous and was not able to tolerate it.  She sleeps 4-6 hrs. Denies waking up and husband advised she doesn't snore like she used to. She has lost weight.  just recently has started to wake up feeling drained         HISTORY OF PRESENT ILLNESS:  [name] [name] is a 68 y.o. female patient who is seen upon a new PCP referral on 03/11/2024 , here to reestablish care for sleep at [facility]. She had a PSG in 2016 and was started on CPAP.  GERD was bad at the time, too . States that she stopped using PAP  because she would get a lot of respiratory issues , was getting sick with it. She states she was cleaning and everything but she found that she had a increase in mucous and was not able to tolerate it.  She sleeps 4-6 hrs. Denies waking up and husband advised she doesn't snore like she used to. She has lost weight. Prediabetic and has Sjoegrens syndrome  just recently has started to wake up feeling drained.   Chief concern according to patient :  see above, now retired and feeling overall well. She is active with many things.  She watches her grand kids, age 87 and 62.  Gardening.    I have the pleasure of seeing [name] [name] 03/11/24 a right-handed female with a history of a sleep disorder. Sjoegrens syndrome  GERD. Had  thyroid  biopsies, benign result/    The patient had the first sleep study in the year 2016 here at [facility]-     REASON FOR VISIT: Obstructive sleep apnea needs CPAP supplies HISTORY FROM: Patient     Interval history from 03/28/2017,  I had the pleasure of seeing Mrs. Ferrebee who has been an established CPAP user.  The patient had reported an increasing level of fatigue when she spoke to her primary care provider, Dr. Bonnie Gaskins, and to her cardiologist, Dr. Ladona.  Both asked her to follow up with me. She was just seen on 02/23/2017 with a high compliance with CPAP use, low residual AHI of 1.3, and no need for changes in the settings was seen. The patient reports rare palpitations not necessarily at night, and she has not make the connection to caffeine intake yet.  She states that she drinks one glass of tea ice tea, and that 3 times a week. She is not a soda drinker and she does not drink coffee. She is also not as sleepy as she used to be before CPAP was initiated. She is here to be evaluated for left leg weakness and numbness.  She reports that the  numbness and weakness has been happening off and on for years. She has never fallen, she has no pain in the leg. She feels that the weakness is related to the knee joint. She also reports that her right knee is now starting to bother her as well. The symptoms are mostly felt when she is rising up from a seated position starting to move. She feels as if they could give out. She feels the knees are big. She reports loss of muscle bulk at the hip flexors, quadriceps.        HISTORY OF PRESENT ILLNESS:UPDATE 02/23/2017 CM Ms. Barcia, 67 year old female returns for follow-up with her last visit being July 2016. She did not show for follow-up appointments. She needs new CPAP supplies and a compliance report. 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 Her CPAP report from 01/23/2017 to 02/21/2017 greater than 4  hours usage 27 days for 90%. Average usage 5 hours 50 minutes. Pressure 5 cm minimum and max 15. EPR full-time level III AHI 1.3. She has an appointment with Dr. Chalice on July 24 to address the weakness in the left lower extremity. She returns for reevaluation. here to follow-up for CPAP compliance and CPAP supplies.CPAP report from 01/23/2017 to 02/21/2017 greater than 4 hours usage 27 days for 90%. Average usage 5 hours 50 minutes. Pressure 5 cm minimum and max 15. EPR full-time level 3 AHI 1.3.The patient is a current patient of Dr. Chalice  who is out of the office today . This note is sent to the work in doctor.      Social history:  Patient is  retired  and lives in a household with hr husband . Family status is married, with 2 adult children, 2 grandchildren and her daughter live close. .  Caffeine intake in form of Coffee( decaff) Soda( /) Tea ( sometimes.) or energy drinks Exercise in form of swimming.   Hobbies :gardening, grandchildren.    Sleep habits are as follows: The patient's dinner time is between 5-6 PM. The patient goes to bed at 10-12 PM and continues to sleep for 4-6 hours,  more often 4-5 , she is not waking up for  bathroom breaks.  The preferred sleep position is left sided , with the support of 2 pillows.  Dreams are reportedly frequent.   The patient wakes up spontaneously/ 5-6 AM without an alarm. 6  AM is the usual rise time. She reports not feeling refreshed or restored in AM, without  symptoms such as dry mouth, no morning headaches, and residual fatigue.   Naps are taken infrequently.  Review of Systems: Out of a complete 14 system review, the patient complains of only the following symptoms, and all other reviewed systems are negative.:  Fatigue, sleepiness , but no longer snoring,  SICCa , short but not fragmented sleep. Some RLS - infrequently   How likely are you to doze in the following situations: 0 = not likely, 1 = slight chance, 2 = moderate chance, 3 =  high chance   Sitting and Reading? Watching Television? Sitting inactive in a public place (theater or meeting)? As a passenger in a car for an hour without a break? Lying down in the afternoon when circumstances permit? Sitting and talking to someone? Sitting quietly after lunch without alcohol? In a car, while stopped for a few minutes in traffic?   Total = 11/ 24 points   FSS endorsed at 23/ 63 points.  GDs 3/ 15    In 2016, the Epworth ss was endorsed at 17 points !!!   Social History   Socioeconomic History   Marital status: Married    Spouse name: Not on file   Number of children: 2   Years of education: Not on file   Highest education level: Bachelor's degree (e.g., BA, AB, BS)  Occupational History   Not on file  Tobacco Use   Smoking status: Never    Passive exposure: Past   Smokeless tobacco: Never  Vaping Use   Vaping status: Never Used  Substance and Sexual Activity   Alcohol use: Yes    Comment: 1-2 times a year   Drug use: No   Sexual activity: Yes    Birth control/protection: None    Comment: Hysterectomy  Other Topics Concern   Not on file  Social History Narrative   Married.   Drinks occasional sweet tea.   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   Social Drivers of Corporate investment banker Strain: Low Risk  (12/12/2023)   Overall Financial Resource Strain (CARDIA)    Difficulty of Paying Living Expenses: Not hard at all  Food Insecurity: No Food Insecurity (12/12/2023)   Hunger Vital Sign    Worried About Running Out of Food in the Last Year: Never true    Ran Out of Food in the Last Year: Never true  Transportation Needs: No Transportation Needs (12/12/2023)   PRAPARE - Administrator, Civil Service (Medical): No    Lack of Transportation (Non-Medical): No  Physical Activity: Insufficiently Active (12/12/2023)   Exercise Vital Sign    Days of Exercise per Week: 2 days    Minutes of Exercise per  Session: 50 min  Stress: No Stress Concern Present (12/12/2023)   [employer] of Occupational Health - Occupational Stress Questionnaire    Feeling of Stress : Not at all  Social Connections: Socially Integrated (12/12/2023)   Social Connection and Isolation Panel    Frequency of Communication with Friends and Family: More than three times a week    Frequency of Social Gatherings with Friends and Family: Once a week    Attends Religious Services: More than 4 times per year    Active Member of [employer] or Organizations: Yes    Attends Banker Meetings: 1 to 4 times per year    Marital Status: Married    Family History  Problem Relation Age of Onset   Colon cancer Mother    Cancer Mother    Early death Mother    Hearing loss Mother    Early death Father    Pancreatic cancer Brother    Healthy Daughter    Healthy Son    Heart disease Maternal Grandfather    Hypertension Maternal Grandfather    Cancer Brother    Early death Brother     Past Medical History:  Diagnosis Date   Abnormal liver enzymes    Acid reflux    Arthritis    back and neck   Bloating    Bronchitis    Chest pain, unspecified    Diarrhea    Dyspepsia    Encounter for Medicare annual wellness exam 06/13/2023   Esophageal reflux    H/O acute pancreatitis    H/O hiatal hernia    Headache(784.0)    migraines   Hemorrhage of rectum and anus    Hypercholesteremia    Nausea  Sjogren's disease (HCC)    Sleep apnea    Spinal stenosis, cervical region    Thyroid  disease    Thyroid  nodule     Past Surgical History:  Procedure Laterality Date   ABDOMINAL HYSTERECTOMY  2007   total with BSO   BIOPSY THYROID   02/16/2023   capsulorhexis Left 09/13/2023   (eye procedure)   CHOLECYSTECTOMY  1985   COLONOSCOPY     ECTOPIC PREGNANCY SURGERY  1984   ESOPHAGOGASTRODUODENOSCOPY     EUS  12/21/2011   Procedure: ESOPHAGEAL ENDOSCOPIC ULTRASOUND (EUS) RADIAL;  Surgeon: Elsie Cree, MD;   Location: [facility] END[facility]COPY;  Service: Endoscopy;  Laterality: N/A;   eye cautery     EYE SURGERY     lasik   EYE SURGERY Left    cataract extraction    KNEE ARTHROSCOPY Right 06/11/2020   torn meniscus      Current Outpatient Medications on File Prior to Visit  Medication Sig Dispense Refill   Apoaequorin (PREVAGEN) 10 MG CAPS Take by mouth daily at 6 (six) AM.     Ascorbic Acid (VITAMIN C) POWD as needed (immune support).     chlorpheniramine (ALLERGY RELIEF) 4 MG tablet Take 4 mg by mouth 2 (two) times daily as needed for allergies.     Cyanocobalamin  (VITAMIN B 12 PO) Take by mouth.     GINSENG CHINESE RED PO Take by mouth.     Glucosamine-Chondroitin-MSM 375-300-237.5 MG CAPS Take by mouth.     omeprazole  (PRILOSEC) 40 MG capsule Take 30-60 min before first meal of the day 30 capsule 11   OVER THE COUNTER MEDICATION Curamin- 2 tablets daily.     rosuvastatin  (CRESTOR ) 5 MG tablet Take 1 tablet (5 mg total) by mouth daily. 90 tablet 3   VITAMIN D  PO Take 5,000 Units by mouth daily.     No current facility-administered medications on file prior to visit.    Allergies  Allergen Reactions   Morphine And Codeine Shortness Of Breath   Other     TRILTYE- stomach upset   Sulfa Antibiotics Hives     DIAGNOSTIC DATA (LABS, IMAGING, TESTING) - I reviewed patient records, labs, notes, testing and imaging myself where available.  Lab Results  Component Value Date   WBC 5.2 02/26/2024   HGB 13.9 02/26/2024   HCT 42.5 02/26/2024   MCV 87.4 02/26/2024   PLT 220 02/26/2024      Component Value Date/Time   NA 141 02/26/2024 1012   NA 142 12/25/2023 0905   K 4.3 02/26/2024 1012   CL 107 02/26/2024 1012   CO2 28 02/26/2024 1012   GLUCOSE 90 02/26/2024 1012   BUN 13 02/26/2024 1012   BUN 12 12/25/2023 0905   CREATININE 0.71 02/26/2024 1012   CALCIUM  9.8 02/26/2024 1012   PROT 7.4 02/26/2024 1012   PROT 7.4 02/26/2024 1012   PROT 6.8 12/25/2023 0905   ALBUMIN 4.6 12/25/2023 0905    AST 15 02/26/2024 1012   ALT 16 02/26/2024 1012   ALKPHOS 110 12/25/2023 0905   BILITOT 1.2 02/26/2024 1012   BILITOT 0.8 12/25/2023 0905   GFRNONAA >60 06/30/2022 1300   GFRNONAA 76 09/29/2020 1431   GFRAA 88 09/29/2020 1431   Lab Results  Component Value Date   CHOL 201 (H) 12/25/2023   HDL 66 12/25/2023   LDLCALC 126 (H) 12/25/2023   LDLDIRECT 149 (H) 11/03/2022   TRIG 47 12/25/2023   CHOLHDL 3.0 12/25/2023   Lab Results  Component  Value Date   HGBA1C 5.8 (H) 12/25/2023   Lab Results  Component Value Date   VITAMINB12 1,425 (H) 05/16/2023   Lab Results  Component Value Date   TSH 0.98 09/28/2023    PHYSICAL EXAM:  Today's Vitals   03/11/24 1005  BP: 129/79  Pulse: 75  Weight: 191 lb (86.6 kg)  Height: 5' 6 (1.676 m)   Body mass index is 30.83 kg/m.   Wt Readings from Last 3 Encounters:  03/11/24 191 lb (86.6 kg)  02/26/24 192 lb (87.1 kg)  12/27/23 191 lb (86.6 kg)     Ht Readings from Last 3 Encounters:  03/11/24 5' 6 (1.676 m)  02/26/24 5' 6.5 (1.689 m)  12/27/23 5' 6 (1.676 m)      General: The patient is awake, alert and appears not in acute distress. The patient is well groomed. Head: Normocephalic, atraumatic. Neck is supple. Mallampati 2,  neck circumference: 16 inches . Nasal airflow fully patent.   Retrognathia is  seen.  Dental status:  Cardiovascular:  Regular rate and cardiac rhythm by pulse,  without distended neck veins. Respiratory: Lungs are clear to auscultation.  Skin:  Without evidence of ankle edema, or rash. Trunk: The patient's posture is erect.   NEUROLOGIC EXAM: The patient is awake and alert, oriented to place and time.   Memory subjective described as intact.  Attention span & concentration ability appears normal.  Speech is fluent,  without  dysarthria, dysphonia or aphasia.  Mood and affect are appropriate.   Cranial nerves: no loss of smell or taste reported  Pupils are equal and briskly reactive to light.  Funduscopic exam deferred. .  Extraocular movements in vertical and horizontal planes were intact and without nystagmus. No Diplopia. Visual fields by finger perimetry are intact. Hearing was intact to soft voice and finger rubbing.    Facial sensation intact to fine touch.  Facial motor strength is symmetric and tongue and uvula move midline.  Neck ROM : rotation, tilt and flexion extension were normal for age and shoulder shrug was symmetrical.    Motor exam:  Symmetric bulk, tone and ROM.   Normal tone without cog -wheeling, symmetric grip strength .   Sensory:  Fine touch, pinprick and vibration were tested  and  normal.  Proprioception tested in the upper extremities was normal.   Coordination: Rapid alternating movements in the fingers/hands were of normal speed.  The Finger-to-nose maneuver was intact without evidence of ataxia, dysmetria or tremor.   Gait and station: Patient could rise unassisted from a seated position, walked without assistive device.  She reports episodes feeling off balance  , always drifting to the sides, not forward or backwards.  Stance is of normal width/ base.  Toe and heel walk were deferred.  Deep tendon reflexes: in the  upper and lower extremities are symmetric and intact.  Babinski response was deferred.     ASSESSMENT AND PLAN 68 y.o. year old female  here with:    1) history of OSA on CPAP which she couldn't tolerate well. Now here to see why she is more fatigued  when she actually no longer snores. Has a newer dx of Sjgren, and she has reported some RLS, but has no neuropathy.   Component Ref Range & Units (hover) 2 wk ago 10 mo ago 1 yr ago 2 yr ago 3 yr ago 4 yr ago  SSA (Ro) (ENA) Antibody, IgG >8.0 POS Abnormal  7.7 POS Abnormal  >8.0 POS Abnormal  >  8.0 POS Abnormal  >8.0 POS Abnormal  >8.0 POS Abnormal    Plan;  Go to bed a little earlier and add an hour of sleep to the total sleep time.   We will check your AHI by a new sleep test,  a HST.  I ordered a watch pat.   Continue treatment for GERD if needed.   Get an abdominal CT to rule out AAA.  The patient  described a flatter sensation in the  lower back , mid spine, and rare RLS symptoms but has no neuropathy.  She advised me that she had an abdominal CT  08-19-2023, which I reviewed.    I plan to follow up either personally or through our NP within 4-5 months.    I would like to thank Jarold Medici, MD and Jarold Medici, Md [address] [place],  [place] [address] for allowing me to meet with and to take care of this pleasant patient.     After spending a total time of  45  minutes face to face and additional time for physical and neurologic examination, review of laboratory studies,  personal review of imaging studies, reports and results of other testing and review of referral information / records as far as provided in visit,   Electronically signed by: Dedra Gores, MD 03/11/2024 10:22 AM  [facility] and [facility] Board certified by The [employer] of Sleep Medicine and Diplomate of the [employer] of Sleep Medicine. Board certified In Neurology through the ABPN, Fellow of the [employer] of Neurology.

## 2024-03-27 DIAGNOSIS — M3500 Sicca syndrome, unspecified: Secondary | ICD-10-CM | POA: Diagnosis not present

## 2024-03-27 DIAGNOSIS — E782 Mixed hyperlipidemia: Secondary | ICD-10-CM | POA: Diagnosis not present

## 2024-03-27 DIAGNOSIS — R600 Localized edema: Secondary | ICD-10-CM | POA: Diagnosis not present

## 2024-03-28 LAB — LIPID PANEL
Chol/HDL Ratio: 2.5 ratio (ref 0.0–4.4)
Cholesterol, Total: 175 mg/dL (ref 100–199)
HDL: 69 mg/dL (ref >39)
LDL Chol Calc (NIH): 96 mg/dL (ref 0–99)
Triglycerides: 48 mg/dL (ref 0–149)
VLDL Cholesterol Cal: 10 mg/dL (ref 5–40)

## 2024-03-28 LAB — PRO B NATRIURETIC PEPTIDE: NT-Pro BNP: 42 pg/mL (ref 0–301)

## 2024-03-28 LAB — ALT: ALT: 16 IU/L (ref 0–32)

## 2024-03-29 ENCOUNTER — Ambulatory Visit: Payer: Self-pay

## 2024-03-29 DIAGNOSIS — I2510 Atherosclerotic heart disease of native coronary artery without angina pectoris: Secondary | ICD-10-CM

## 2024-03-29 DIAGNOSIS — E782 Mixed hyperlipidemia: Secondary | ICD-10-CM

## 2024-04-01 MED ORDER — ROSUVASTATIN CALCIUM 10 MG PO TABS: 10.0000 mg | ORAL_TABLET | Freq: Every day | ORAL | 3 refills | Status: DC

## 2024-04-01 NOTE — Telephone Encounter (Signed)
 FLP, ALT orders due in 3 months placed and released for future draw.

## 2024-04-02 ENCOUNTER — Ambulatory Visit (INDEPENDENT_AMBULATORY_CARE_PROVIDER_SITE_OTHER): Admitting: Neurology

## 2024-04-02 DIAGNOSIS — E042 Nontoxic multinodular goiter: Secondary | ICD-10-CM

## 2024-04-02 DIAGNOSIS — M3500 Sicca syndrome, unspecified: Secondary | ICD-10-CM

## 2024-04-02 DIAGNOSIS — M3505 Sjogren syndrome with inflammatory arthritis: Secondary | ICD-10-CM

## 2024-04-02 DIAGNOSIS — G4710 Hypersomnia, unspecified: Secondary | ICD-10-CM | POA: Diagnosis not present

## 2024-04-02 DIAGNOSIS — K219 Gastro-esophageal reflux disease without esophagitis: Secondary | ICD-10-CM

## 2024-04-03 NOTE — Progress Notes (Signed)
 [facility]  [name] [name] 68 year old female Sep 12, 1955   HOME SLEEP TEST REPORT ( by Watch PAT)   STUDY DATE:  04-02-2024  ORDERING CLINICIAN: Dedra Gores, MD  REFERRING CLINICIAN:    CLINICAL INFORMATION/HISTORY: [name] [name] is a 68 y.o. female patient who is seen upon PCP referral on 03/11/2024 , here to reestablish care for sleep at [facility]. She had testing by a PSG in 2016, was dx with OSA  and was started on CPAP. She had at the time endorsed the ESS at 17/ 24 points.  GERD was bad at the time, she became prediabetic and has Sjoegrens syndrome. She has thyroid  nodules.  She stopped using PAP because of respiratory issues , she was getting sick with it, produced increasingly more mucous and was congested -not able to tolerate PAP She sleeps 4-6 hours , denies waking up and husband reported that she doesn't snore like she used to since she has lost weight. Prediabetic and has Sjoegrens syndrome  Just recently has started to wake up feeling drained- a more severe fatigue than she has known before.     Epworth sleepiness score:  11/ 24 points   FSS endorsed at 23/ 63 points.  GDs 3/ 15      BMI: 31 kg/m   Neck Circumference: 16   FINDINGS:   Sleep Summary:   Total Recording Time (hours, min): 9 hours 29 minutes       Total Sleep Time (hours, min):    8 hours 11 minutes             Percent REM (%): 23%                                      Respiratory Indices:   Calculated pAHI (per CMS guideline):.  7.4/h         ( F When following AASM guidelines this patient would have had an AHI of 14.8 at the border between mild and moderate obstructive sleep apnea).                REM pAHI:   16.4/h                                              NREM pAHI:    4.8/h                          Positional AHI:   This patient slept mostly supine but also prone and left lateral.  Supine AHI was 7.8/h, left-sided AHI is 3.9/h and prone AHI 2.6/h. Snoring:    Mean volume 41 dB which is mild.                                             Oxygen Saturation Statistics:   Oxygen Saturation (%) Mean:   94%             O2 Saturation Range (%):    Between the nadir at 84% and a maximum of 98%  O2 Saturation (minutes) <89%:   1.3 minutes total        Pulse Rate Statistics:   Pulse Mean (bpm):       66 bpm          Pulse Range:   Between a minimum of 53 and a maximum heart rate of 104 bpm.              IMPRESSION:  This HST confirms the presence of very mild and all obstructive sleep apnea.  No significant oxygen desaturation.   There is a strong REM sleep dependence noted yet overall, this patient may do well without CPAP if she avoids supine sleep. REM sleep was recorded while in supine position as well as in prone position.  Prone positioning seems to be the least apnea provoking position of sleep.    RECOMMENDATION: Resuming CPAP therapy would be optional for this patient who does have a mild degree of apnea and does no longer snore significantly, but I want to offer this therapy to the patient, hoping that she will experience an improvement in sleepiness.  Given history of Sjgren's, the patient's fatigue may also come from autoimmune /inflammatory disorders or thyroid  dysfunction.    An autotitration capable ResMed CPAP device will be offered, setting between 5-15 CM WATER, 2 cm EPR and heated humidification, nasal mask or pillow to be tried first.    Any patient should be cautioned not to drive, work at heights, or operate dangerous or heavy equipment when tired or sleepy.   Review of good sleep hygiene measures is accessible to any sleep clinic patient and can be reiterated through online material- I we recommend the Guide to better Sleep   by the NIH.   Weight loss and Core Strength improvement is recommended for individuals with low muscle tone and/ or a BMI over 32.  The referring physician will be  notified of the test results.       INTERPRETING PHYSICIAN:   Dedra Gores, MD  [facility] and [facility] Sleep Board certified by The [employer] of Sleep Medicine and Diplomate of the [employer] of Sleep Medicine. Board certified In Neurology through the ABPN, Fellow of the [employer] of Neurology.

## 2024-04-08 ENCOUNTER — Ambulatory Visit: Payer: Self-pay | Admitting: Neurology

## 2024-04-08 DIAGNOSIS — G4710 Hypersomnia, unspecified: Secondary | ICD-10-CM

## 2024-04-08 NOTE — Procedures (Signed)
 [facility]  [name] 68 year old female 09/21/1955   HOME SLEEP TEST REPORT ( by Watch PAT)   STUDY DATE:  04-02-2024  ORDERING CLINICIAN: Dedra Gores, MD  REFERRING CLINICIAN:    CLINICAL INFORMATION/HISTORY: [name] [name] is a 68 y.o. female patient who is seen upon PCP referral on 03/11/2024 , here to reestablish care for sleep at [facility]. She had testing by a PSG in 2016, was dx with OSA  and was started on CPAP. She had at the time endorsed the ESS at 17/ 24 points.  GERD was bad at the time, she became prediabetic and has Sjoegrens syndrome. She has thyroid  nodules.  She stopped using PAP because of respiratory issues , she was getting sick with it, produced increasingly more mucous and was congested -not able to tolerate PAP She sleeps 4-6 hours , denies waking up and husband reported that she doesn't snore like she used to since she has lost weight. Prediabetic and has Sjoegrens syndrome  Just recently has started to wake up feeling drained- a more severe fatigue than she has known before.     Epworth sleepiness score:  11/ 24 points   FSS endorsed at 23/ 63 points.  GDs 3/ 15      BMI: 31 kg/m   Neck Circumference: 16   FINDINGS:   Sleep Summary:   Total Recording Time (hours, min): 9 hours 29 minutes       Total Sleep Time (hours, min):    8 hours 11 minutes             Percent REM (%): 23%                                      Respiratory Indices:   Calculated pAHI (per CMS guideline):.  7.4/h         ( F When following AASM guidelines this patient would have had an AHI of 14.8 at the border between mild and moderate obstructive sleep apnea).                REM pAHI:   16.4/h                                              NREM pAHI:    4.8/h                          Positional AHI:   This patient slept mostly supine but also prone and left lateral.  Supine AHI was 7.8/h, left-sided AHI is 3.9/h and prone AHI 2.6/h. Snoring:    Mean volume 41 dB which is mild.                                             Oxygen Saturation Statistics:   Oxygen Saturation (%) Mean:   94%             O2 Saturation Range (%):    Between the nadir at 84% and a maximum of 98%  O2 Saturation (minutes) <89%:   1.3 minutes total        Pulse Rate Statistics:   Pulse Mean (bpm):       66 bpm          Pulse Range:   Between a minimum of 53 and a maximum heart rate of 104 bpm.              IMPRESSION:  This HST confirms the presence of very mild and all obstructive sleep apnea.  No significant oxygen desaturation.   There is a strong REM sleep dependence noted yet overall, this patient may do well without CPAP if she avoids supine sleep. REM sleep was recorded while in supine position as well as in prone position.  Prone positioning seems to be the least apnea provoking position of sleep.    RECOMMENDATION: Resuming CPAP therapy would be optional for this patient who does have a mild degree of apnea and does no longer snore significantly, but I want to offer this therapy to the patient, hoping that she will experience an improvement in sleepiness.  Given history of Sjgren's, the patient's fatigue may also come from autoimmune /inflammatory disorders or thyroid  dysfunction.    An autotitration capable ResMed CPAP device will be offered, setting between 5-15 CM WATER, 2 cm EPR and heated humidification, nasal mask or pillow to be tried first.    Any patient should be cautioned not to drive, work at heights, or operate dangerous or heavy equipment when tired or sleepy.   Review of good sleep hygiene measures is accessible to any sleep clinic patient and can be reiterated through online material- I we recommend the Guide to better Sleep   by the NIH.   Weight loss and Core Strength improvement is recommended for individuals with low muscle tone and/ or a BMI over 32.  The referring physician will be  notified of the test results.       INTERPRETING PHYSICIAN:   Dedra Gores, MD  [facility] and [facility] Sleep Board certified by The [employer] of Sleep Medicine and Diplomate of the [employer] of Sleep Medicine. Board certified In Neurology through the ABPN, Fellow of the [employer] of Neurology.

## 2024-04-10 NOTE — Telephone Encounter (Signed)
-----   Message from [place] sent at 04/08/2024  8:48 AM EDT ----- IMPRESSION:  This HST confirms the presence of very mild and all obstructive sleep apnea.  No significant oxygen desaturation.   There is a strong REM sleep dependence noted yet overall, this patient may do well without CPAP if she avoids supine sleep. REM sleep was recorded while in supine position as well as in prone position.  Prone positioning seems to be the least apnea provoking position of sleep.     RECOMMENDATION: Resuming CPAP therapy would be optional for this patient who does have a mild degree of apnea and does no longer snore significantly, but I want to offer this therapy to the patient,  hoping that she will experience an improvement in sleepiness.   Given history of Sjgren's, the patient's fatigue may also come from this autoimmune /inflammatory disorder or thyroid  dysfunction.     An autotitration capable ResMed CPAP device will be offered, setting between 5-15 CM WATER, 2 cm EPR and heated humidification, nasal mask or pillow to be tried first.  ----- Message ----- From: Chalice Saunas, MD Sent: 04/08/2024   8:45 AM EDT To: Saunas Chalice, MD

## 2024-04-13 ENCOUNTER — Encounter: Payer: Self-pay | Admitting: Internal Medicine

## 2024-04-24 ENCOUNTER — Telehealth: Payer: Self-pay

## 2024-04-24 NOTE — Telephone Encounter (Signed)
 Pt LVM about next steps and requesting a call back from the nurse

## 2024-04-26 DIAGNOSIS — H9193 Unspecified hearing loss, bilateral: Secondary | ICD-10-CM | POA: Diagnosis not present

## 2024-04-26 DIAGNOSIS — H903 Sensorineural hearing loss, bilateral: Secondary | ICD-10-CM | POA: Diagnosis not present

## 2024-05-14 DIAGNOSIS — K080 Exfoliation of teeth due to systemic causes: Secondary | ICD-10-CM | POA: Diagnosis not present

## 2024-05-27 ENCOUNTER — Encounter: Payer: Self-pay | Admitting: Rheumatology

## 2024-05-27 ENCOUNTER — Encounter (INDEPENDENT_AMBULATORY_CARE_PROVIDER_SITE_OTHER): Payer: Self-pay | Admitting: Internal Medicine

## 2024-05-27 DIAGNOSIS — I2510 Atherosclerotic heart disease of native coronary artery without angina pectoris: Secondary | ICD-10-CM

## 2024-05-27 NOTE — Telephone Encounter (Signed)
 Should be ok to take from a rheumatologic standpoint.

## 2024-05-28 DIAGNOSIS — H52223 Regular astigmatism, bilateral: Secondary | ICD-10-CM | POA: Diagnosis not present

## 2024-05-28 NOTE — Telephone Encounter (Signed)
 Patient with CAC and elevated cholesterol with questions about a variety of supplements. In review, I do not see any clear interactions.  I have no direct evidence it will improve her cardiac care but is safe to try.   Please see the MyChart message reply(ies) for my assessment and plan.    This patient gave consent for this Medical Advice Message and is aware that it may result in a bill to [employer], as well as the possibility of receiving a bill for a co-payment or deductible. They are an established patient, but are not seeking medical advice exclusively about a problem treated during an in person or video visit in the last seven days. I did not recommend an in person or video visit within seven days of my reply.    I spent a total of 11 minutes cumulative time within 7 days through [employer].  Stanly DELENA Leavens, MD

## 2024-06-03 MED ORDER — ATORVASTATIN CALCIUM 20 MG PO TABS: 20.0000 mg | ORAL_TABLET | Freq: Every day | ORAL | 3 refills | Status: DC

## 2024-06-03 NOTE — Addendum Note (Signed)
 Addended by: RANDY HAMP SAILOR on: 06/03/2024 07:52 AM   Modules accepted: Orders

## 2024-06-13 ENCOUNTER — Ambulatory Visit: Payer: Self-pay

## 2024-06-13 ENCOUNTER — Encounter: Payer: Self-pay | Admitting: Internal Medicine

## 2024-06-14 ENCOUNTER — Ambulatory Visit

## 2024-06-14 DIAGNOSIS — Z0000 Encounter for general adult medical examination without abnormal findings: Secondary | ICD-10-CM | POA: Diagnosis not present

## 2024-06-14 NOTE — Progress Notes (Signed)
 Subjective:   [name] [name] is a 68 y.o. female who presents for Medicare Annual (Subsequent) preventive examination.  Visit Complete: Virtual I connected with  [name] [name] Clarity on 06/14/24 by a audio enabled telemedicine application and verified that I am speaking with the correct person using two identifiers.  Patient Location: Home  Provider Location: Office/Clinic  I discussed the limitations of evaluation and management by telemedicine. The patient expressed understanding and agreed to proceed.  Vital Signs: Because this visit was a virtual/telehealth visit, some criteria may be missing or patient reported. Any vitals not documented were not able to be obtained and vitals that have been documented are patient reported.  Patient Medicare AWV questionnaire was completed by the patient on 06/14/24; I have confirmed that all information answered by patient is correct and no changes since this date.  Cardiac Risk Factors include: none     Objective:    Today's Vitals   06/14/24 0950  PainSc: 5    There is no height or weight on file to calculate BMI.     06/13/2023    3:42 PM 06/30/2022   12:52 PM 01/09/2021    4:40 AM 10/16/2019   11:20 AM 10/22/2017    2:42 PM 10/21/2017   10:12 PM 03/25/2017    7:26 PM  Advanced Directives  Does Patient Have a Medical Advance Directive? No No No No Yes  Yes  No   Type of Advance Directive     Living will Living will   Would patient like information on creating a medical advance directive?  No - Patient declined No - Patient declined No - Patient declined        Data saved with a previous flowsheet row definition    Current Medications (verified) Outpatient Encounter Medications as of 06/14/2024  Medication Sig   Ascorbic Acid (VITAMIN C) POWD as needed (immune support).   atorvastatin (LIPITOR) 20 MG tablet Take 1 tablet (20 mg total) by mouth daily.   chlorpheniramine (ALLERGY RELIEF) 4 MG tablet Take 4 mg by mouth 2 (two) times  daily as needed for allergies.   Cyanocobalamin  (VITAMIN B 12 PO) Take by mouth.   GINSENG CHINESE RED PO Take by mouth.   Glucosamine-Chondroitin-MSM 375-300-237.5 MG CAPS Take by mouth.   omeprazole  (PRILOSEC) 40 MG capsule Take 30-60 min before first meal of the day   VITAMIN D  PO Take 5,000 Units by mouth daily.   [DISCONTINUED] Apoaequorin (PREVAGEN) 10 MG CAPS Take by mouth daily at 6 (six) AM.   [DISCONTINUED] OVER THE COUNTER MEDICATION Curamin- 2 tablets daily.   No facility-administered encounter medications on file as of 06/14/2024.    Allergies (verified) Morphine and codeine, Other, and Sulfa antibiotics   History: Past Medical History:  Diagnosis Date   Abnormal liver enzymes    Acid reflux    Arthritis    back and neck   Bloating    Bronchitis    Chest pain, unspecified    Diarrhea    Dyspepsia    Encounter for Medicare annual wellness exam 06/13/2023   Esophageal reflux    [name]/O acute pancreatitis    [name]/O hiatal hernia    Headache(784.0)    migraines   Hemorrhage of rectum and anus    Hypercholesteremia    Nausea    Sjogren's disease    Sleep apnea    Spinal stenosis, cervical region    Thyroid  disease    Thyroid  nodule    Past Surgical History:  Procedure Laterality Date   ABDOMINAL HYSTERECTOMY  2007   total with BSO   BIOPSY THYROID   02/16/2023   capsulorhexis Left 09/13/2023   (eye procedure)   CHOLECYSTECTOMY  1989   COLONOSCOPY     ECTOPIC PREGNANCY SURGERY  1984   ESOPHAGOGASTRODUODENOSCOPY     EUS  12/21/2011   Procedure: ESOPHAGEAL ENDOSCOPIC ULTRASOUND (EUS) RADIAL;  Surgeon: Elsie Cree, MD;  Location: [facility];  Service: Endoscopy;  Laterality: N/A;   eye cautery     EYE SURGERY     lasik   EYE SURGERY Left 2004   cataract extraction    KNEE ARTHROSCOPY Right 06/11/2020   torn meniscus    Family History  Problem Relation Age of Onset   Colon cancer Mother    Cancer Mother    Early death Mother    Hearing loss Mother     Early death Father    Pancreatic cancer Brother    Healthy Daughter    Healthy Son    Heart disease Maternal Grandfather    Hypertension Maternal Grandfather    Cancer Brother    Early death Brother    Social History   Socioeconomic History   Marital status: Married    Spouse name: Not on file   Number of children: 2   Years of education: Not on file   Highest education level: Bachelor's degree (e.g., BA, AB, BS)  Occupational History   Not on file  Tobacco Use   Smoking status: Never    Passive exposure: Past   Smokeless tobacco: Never  Vaping Use   Vaping status: Never Used  Substance and Sexual Activity   Alcohol use: Yes    Comment: 1-2 times a year   Drug use: No   Sexual activity: Yes    Birth control/protection: None    Comment: Hysterectomy  Other Topics Concern   Not on file  Social History Narrative   Married.   Drinks occasional sweet tea.   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   Social Drivers of Corporate investment banker Strain: Low Risk  (06/14/2024)   Overall Financial Resource Strain (CARDIA)    Difficulty of Paying Living Expenses: Not hard at all  Food Insecurity: No Food Insecurity (06/14/2024)   Hunger Vital Sign    Worried About Running Out of Food in the Last Year: Never true    Ran Out of Food in the Last Year: Never true  Transportation Needs: No Transportation Needs (06/14/2024)   PRAPARE - Administrator, Civil Service (Medical): No    Lack of Transportation (Non-Medical): No  Physical Activity: Sufficiently Active (06/14/2024)   Exercise Vital Sign    Days of Exercise per Week: 5 days    Minutes of Exercise per Session: 50 min  Stress: No Stress Concern Present (06/14/2024)   [name] [name] of Occupational Health - Occupational Stress Questionnaire    Feeling of Stress: Not at all  Social Connections: Socially Integrated (06/14/2024)   Social Connection and Isolation Panel     Frequency of Communication with Friends and Family: More than three times a week    Frequency of Social Gatherings with Friends and Family: Twice a week    Attends Religious Services: More than 4 times per year    Active Member of [employer] or Organizations: No    Attends Banker Meetings: 1 to 4 times per year    Marital Status: Married  Tobacco Counseling Counseling given: Not Answered   Clinical Intake:  Pre-visit preparation completed: Yes  Pain : 0-10 Pain Score: 5  Pain Type: Chronic pain Pain Location: Knee Pain Orientation: Left, Right Pain Radiating Towards: into her foot Pain Descriptors / Indicators: Sharp, Constant Pain Onset: More than a month ago Pain Frequency: Occasional Pain Relieving Factors: Tylenol  Arthirits Effect of Pain on Daily Activities: getting up out of the bed  Pain Relieving Factors: Tylenol  Arthirits     How often do you need to have someone help you when you read instructions, pamphlets, or other written materials from your doctor or pharmacy?: 1 - Never What is the last grade level you completed in school?: college  Interpreter Needed?: No  Information entered by :: [name] [name]   Activities of Daily Living    06/14/2024    9:52 AM  In your present state of health, do you have any difficulty performing the following activities:  Hearing? 0  Vision? 0  Difficulty concentrating or making decisions? 0  Walking or climbing stairs? 1  Dressing or bathing? 0  Doing errands, shopping? 0  Preparing Food and eating ? N  Using the Toilet? N  In the past six months, have you accidently leaked urine? N  Do you have problems with loss of bowel control? N  Managing your Medications? N  Managing your Finances? N  Housekeeping or managing your Housekeeping? N    Patient Care Team: Jarold Medici, MD as PCP - General (Internal Medicine) Santo Stanly LABOR, MD as PCP - Cardiology (Cardiology)  Indicate any recent Medical  Services you may have received from other than [facility] providers in the past year (date may be approximate).     Assessment:   This is a routine wellness examination for [name] [name].  Hearing/Vision screen No results found.   Goals Addressed               This Visit's Progress     strengthen knees (pt-stated)        Walk more, be more active.        Depression Screen    06/14/2024    9:58 AM 06/13/2023   12:00 PM 05/16/2023    2:35 PM 02/15/2022    3:29 PM  PHQ 2/9 Scores  PHQ - 2 Score 0 0 0 0  PHQ- 9 Score  0 0     Fall Risk    06/14/2024    9:54 AM 12/12/2023   10:25 AM 06/13/2023   12:00 PM 05/16/2023    2:35 PM  Fall Risk   Falls in the past year? 0 0 0 0  Number falls in past yr: 0 0 0 0  Injury with Fall? 0 0 0 0  Risk for fall due to : No Fall Risks No Fall Risks No Fall Risks No Fall Risks  Follow up Falls evaluation completed Falls evaluation completed Falls evaluation completed Falls evaluation completed    MEDICARE RISK AT HOME: Medicare Risk at Home Any stairs in or around the home?: Yes If so, are there any without handrails?: No Home free of loose throw rugs in walkways, pet beds, electrical cords, etc?: Yes Adequate lighting in your home to reduce risk of falls?: Yes Life alert?: No Use of a cane, walker or w/c?: No Grab bars in the bathroom?: No Shower chair or bench in shower?: No Elevated toilet seat or a handicapped toilet?: No  Cognitive Function:        06/14/2024  9:55 AM 06/13/2023   12:07 PM  6CIT Screen  What Year? 0 points 0 points  What month? 0 points 0 points  What time? 0 points 3 points  Count back from 20 0 points 0 points  Months in reverse 0 points 0 points  Repeat phrase 6 points 0 points  Total Score 6 points 3 points    Immunizations Immunization History  Administered Date(s) Administered   Fluad Trivalent(High Dose 65+) 05/16/2023   Influenza,inj,Quad PF,6+ Mos 06/24/2022   Influenza-Unspecified 09/01/2022    PFIZER(Purple Top)SARS-COV-2 Vaccination 11/22/2019, 12/13/2019, 07/01/2020, 03/26/2021   Tdap 07/13/2017   Zoster Recombinant(Shingrix ) 02/15/2022, 05/10/2022    TDAP status: Up to date  Flu Vaccine status: Up to date  Pneumococcal vaccine status: Due, Education has been provided regarding the importance of this vaccine. Advised may receive this vaccine at local pharmacy or Health Dept. Aware to provide a copy of the vaccination record if obtained from local pharmacy or Health Dept. Verbalized acceptance and understanding.  Covid-19 vaccine status: Completed vaccines  Qualifies for Shingles Vaccine? Yes   Zostavax completed Yes   Shingrix  Completed?: Yes  Screening Tests Health Maintenance  Topic Date Due   Pneumococcal Vaccine: 50+ Years (1 of 2 - PCV) Never done   DEXA SCAN  08/31/2022   Influenza Vaccine  04/05/2024   COVID-19 Vaccine (5 - 2025-26 season) 05/06/2024   Medicare Annual Wellness (AWV)  06/14/2025   Mammogram  12/24/2025   Colonoscopy  04/26/2026   DTaP/Tdap/Td (2 - Td or Tdap) 07/14/2027   Hepatitis C Screening  Completed   Zoster Vaccines- Shingrix   Completed   Meningococcal B Vaccine  Aged Out    Health Maintenance  Health Maintenance Due  Topic Date Due   Pneumococcal Vaccine: 50+ Years (1 of 2 - PCV) Never done   DEXA SCAN  08/31/2022   Influenza Vaccine  04/05/2024   COVID-19 Vaccine (5 - 2025-26 season) 05/06/2024    Colorectal cancer screening: Type of screening: Colonoscopy. Completed 04/26/21. Repeat every 5 years  Mammogram status: Completed 12/25/2023. Repeat every year  Lung Cancer Screening Referral: n/a  Additional Screening:  Hepatitis C Screening: does qualify; Completed 06/07/2018  Vision Screening: Recommended annual ophthalmology exams for early detection of glaucoma and other disorders of the eye. Is the patient up to date with their annual eye exam?  Yes  Who is the provider or what is the name of the office in which the  patient attends annual eye exams? N/a If pt is not established with a provider, would they like to be referred to a provider to establish care? No .   Dental Screening: Recommended annual dental exams for proper oral hygiene   Community Resource Referral / Chronic Care Management: CRR required this visit?  No   CCM required this visit?  No     Plan:     I have personally reviewed and noted the following in the patient's chart:   Medical and social history Use of alcohol, tobacco or illicit drugs  Current medications and supplements including opioid prescriptions. Patient is not currently taking opioid prescriptions. Functional ability and status Nutritional status Physical activity Advanced directives List of other physicians Hospitalizations, surgeries, and ER visits in previous 12 months Vitals Screenings to include cognitive, depression, and falls Referrals and appointments  In addition, I have reviewed and discussed with patient certain preventive protocols, quality metrics, and best practice recommendations. A written personalized care plan for preventive services as well as general preventive health  recommendations were provided to patient.     [name] [name], CMA   06/14/2024   After Visit Summary: (MyChart) Due to this being a telephonic visit, the after visit summary with patients personalized plan was offered to patient via MyChart   Nurse Notes: [name] [name], CMA

## 2024-06-14 NOTE — Patient Instructions (Signed)

## 2024-06-18 ENCOUNTER — Encounter: Payer: Self-pay | Admitting: Internal Medicine

## 2024-06-18 ENCOUNTER — Ambulatory Visit: Payer: Self-pay | Admitting: Internal Medicine

## 2024-06-18 VITALS — BP 110/80 | HR 79 | Temp 98.0°F | Ht 66.0 in | Wt 192.6 lb

## 2024-06-18 DIAGNOSIS — G4733 Obstructive sleep apnea (adult) (pediatric): Secondary | ICD-10-CM

## 2024-06-18 DIAGNOSIS — M3500 Sicca syndrome, unspecified: Secondary | ICD-10-CM

## 2024-06-18 DIAGNOSIS — M170 Bilateral primary osteoarthritis of knee: Secondary | ICD-10-CM

## 2024-06-18 DIAGNOSIS — M2550 Pain in unspecified joint: Secondary | ICD-10-CM

## 2024-06-18 DIAGNOSIS — Z23 Encounter for immunization: Secondary | ICD-10-CM

## 2024-06-18 DIAGNOSIS — K5909 Other constipation: Secondary | ICD-10-CM

## 2024-06-18 DIAGNOSIS — I2510 Atherosclerotic heart disease of native coronary artery without angina pectoris: Secondary | ICD-10-CM

## 2024-06-18 DIAGNOSIS — E6609 Other obesity due to excess calories: Secondary | ICD-10-CM

## 2024-06-18 DIAGNOSIS — M791 Myalgia, unspecified site: Secondary | ICD-10-CM

## 2024-06-18 DIAGNOSIS — E66811 Obesity, class 1: Secondary | ICD-10-CM

## 2024-06-18 DIAGNOSIS — E041 Nontoxic single thyroid nodule: Secondary | ICD-10-CM

## 2024-06-18 DIAGNOSIS — Z6831 Body mass index (BMI) 31.0-31.9, adult: Secondary | ICD-10-CM

## 2024-06-18 DIAGNOSIS — E2839 Other primary ovarian failure: Secondary | ICD-10-CM

## 2024-06-18 DIAGNOSIS — Z0000 Encounter for general adult medical examination without abnormal findings: Secondary | ICD-10-CM | POA: Diagnosis not present

## 2024-06-18 DIAGNOSIS — R7309 Other abnormal glucose: Secondary | ICD-10-CM

## 2024-06-18 NOTE — Progress Notes (Unsigned)
 [name],[name] [name], CMA,acting as a Neurosurgeon for [name] LOISE Slocumb, MD.,have documented all relevant documentation on the behalf of [name] LOISE Slocumb, MD,as directed by  [name] LOISE Slocumb, MD while in the presence of [name] LOISE Slocumb, MD.  Subjective:    Patient ID: [name] [name] , female    DOB: Sep 22, 1955 , 68 y.o.   MRN: [id number]  Chief Complaint  Patient presents with   Annual Exam    Patient presents today for annual exam. She reports compliance with medications. Denies headache, chest pain & sob.  She complains of aches in her knees & legs. She states this started when she started taking Rosuvastatin . Recently med had been changed to Atorvastatin.     HPI Discussed the use of AI scribe software for clinical note transcription with the patient, who gave verbal consent to proceed.  History of Present Illness [name] [name] is a 68 year old female with osteoarthritis and Sjogren's syndrome who presents with joint pain and medication review.  She experiences generalized joint pain, which she attributes to weather changes, with a noted worsening after starting rosuvastatin . Due to muscle aches, she was switched to atorvastatin 20 mg daily on June 04, 2024. She occasionally uses Tylenol  Arthritis and Voltaren gel once daily for joint pain, but turmeric supplements have not provided significant relief. No muscle cramps are reported.  She has a history of Sjogren's syndrome and is under the care of a rheumatologist. Since her last visit in April 2025, she has experienced increased joint pain. Her family history includes arthritis on her mother's side, though she is unsure if it is rheumatoid. She is not currently on any treatment for Sjogren's, and her rheumatologist is monitoring her for organ involvement and positive ANA.  She underwent a hysterectomy in 2007 and continues to experience pain in the incision area, particularly associated with bowel movements. A recent MRI showed no  abnormalities. She has tried Metamucil but experienced stomach discomfort and is considering Miralax for constipation management.  She uses a CPAP machine but reports excessive mucus production when using it, which resolves when she stops. Consequently, she has not been using it regularly.  Her current medications include omeprazole  as needed, vitamin B12, chlorpheniramine for allergies, ginseng, and glucosamine. She has a history of thyroid  nodules, with a benign biopsy in April 2025. She has recently received a flu shot.   HPI   Past Medical History:  Diagnosis Date   Abnormal liver enzymes    Acid reflux    Arthritis    back and neck   Bloating    Bronchitis    Chest pain, unspecified    Diarrhea    Dyspepsia    Encounter for Medicare annual wellness exam 06/13/2023   Esophageal reflux    H/O acute pancreatitis    H/O hiatal hernia    Headache(784.0)    migraines   Hemorrhage of rectum and anus    Hypercholesteremia    Nausea    Sjogren's disease    Sleep apnea    Spinal stenosis, cervical region    Thyroid  disease    Thyroid  nodule      Family History  Problem Relation Age of Onset   Colon cancer Mother    Cancer Mother    Early death Mother    Hearing loss Mother    Early death Father    Pancreatic cancer Brother    Healthy Daughter    Healthy Son    Heart disease Maternal Grandfather  Hypertension Maternal Grandfather    Cancer Brother    Early death Brother      Current Outpatient Medications:    Ascorbic Acid (VITAMIN C) POWD, as needed (immune support)., Disp: , Rfl:    atorvastatin (LIPITOR) 20 MG tablet, Take 1 tablet (20 mg total) by mouth daily., Disp: 90 tablet, Rfl: 3   chlorpheniramine (ALLERGY RELIEF) 4 MG tablet, Take 4 mg by mouth 2 (two) times daily as needed for allergies., Disp: , Rfl:    Cyanocobalamin  (VITAMIN B 12 PO), Take by mouth., Disp: , Rfl:    GINSENG CHINESE RED PO, Take by mouth., Disp: , Rfl:    Glucosamine-Chondroitin-MSM  375-300-237.5 MG CAPS, Take by mouth., Disp: , Rfl:    omeprazole  (PRILOSEC) 40 MG capsule, Take 30-60 min before first meal of the day, Disp: 30 capsule, Rfl: 11   VITAMIN D  PO, Take 5,000 Units by mouth daily., Disp: , Rfl:    Allergies  Allergen Reactions   Morphine And Codeine Shortness Of Breath   Other     TRILTYE- stomach upset   Sulfa Antibiotics Hives      The patient states she uses {contraceptive methods:5051} for birth control. No LMP recorded. Patient has had a hysterectomy.. {Dysmenorrhea-menorrhagia:21918}. Negative for: breast discharge, breast lump(s), breast pain and breast self exam. Associated symptoms include abnormal vaginal bleeding. Pertinent negatives include abnormal bleeding (hematology), anxiety, decreased libido, depression, difficulty falling sleep, dyspareunia, history of infertility, nocturia, sexual dysfunction, sleep disturbances, urinary incontinence, urinary urgency, vaginal discharge and vaginal itching. Diet regular.The patient states her exercise level is    . The patient's tobacco use is:  Social History   Tobacco Use  Smoking Status Never   Passive exposure: Past  Smokeless Tobacco Never  . She has been exposed to passive smoke. The patient's alcohol use is:  Social History   Substance and Sexual Activity  Alcohol Use Yes   Comment: 1-2 times a year  . Additional information: Last pap ***, next one scheduled for ***.    Review of Systems  Constitutional: Negative.   HENT: Negative.    Eyes: Negative.   Respiratory: Negative.    Cardiovascular: Negative.   Gastrointestinal: Negative.   Endocrine: Negative.   Genitourinary: Negative.   Musculoskeletal: Negative.   Skin: Negative.   Allergic/Immunologic: Negative.   Neurological: Negative.   Hematological: Negative.   Psychiatric/Behavioral: Negative.       Today's Vitals   06/18/24 1104  BP: 110/80  Pulse: 79  Temp: 98 F (36.7 C)  SpO2: 98%  Weight: 192 lb 9.6 oz (87.4 kg)   Height: 5' 6 (1.676 m)   Body mass index is 31.09 kg/m.  Wt Readings from Last 3 Encounters:  06/18/24 192 lb 9.6 oz (87.4 kg)  03/11/24 191 lb (86.6 kg)  02/26/24 192 lb (87.1 kg)     Objective:  Physical Exam Vitals and nursing note reviewed.  Constitutional:      Appearance: Normal appearance.  HENT:     Head: Normocephalic and atraumatic.     Right Ear: Tympanic membrane, ear canal and external ear normal.     Left Ear: Tympanic membrane, ear canal and external ear normal.     Nose: Nose normal.     Mouth/Throat:     Mouth: Mucous membranes are moist.     Pharynx: Oropharynx is clear.  Eyes:     Extraocular Movements: Extraocular movements intact.     Conjunctiva/sclera: Conjunctivae normal.     Pupils: Pupils  are equal, round, and reactive to light.  Cardiovascular:     Rate and Rhythm: Normal rate and regular rhythm.     Pulses: Normal pulses.     Heart sounds: Normal heart sounds.  Pulmonary:     Effort: Pulmonary effort is normal.     Breath sounds: Normal breath sounds.  Chest:  Breasts:    Tanner Score is 5.     Right: Normal.     Left: Normal.  Abdominal:     General: Bowel sounds are normal.     Palpations: Abdomen is soft.  Genitourinary:    Comments: deferred Musculoskeletal:        General: Normal range of motion.     Cervical back: Normal range of motion and neck supple.  Skin:    General: Skin is warm and dry.  Neurological:     General: No focal deficit present.     Mental Status: She is alert and oriented to person, place, and time.  Psychiatric:        Mood and Affect: Mood normal.        Behavior: Behavior normal.         Assessment And Plan:     Encounter for annual health examination  Coronary artery calcification  OSA (obstructive sleep apnea)  Thyroid  nodule  Polyarthralgia -     Sedimentation rate  Estrogen deficiency  Myalgia -     CMP14+EGFR -     CK -     Sedimentation rate  Class 1 obesity due to excess  calories with serious comorbidity and body mass index (BMI) of 31.0 to 31.9 in adult  Immunization due -     Flu vaccine HIGH DOSE PF(Fluzone Trivalent)  Elevated hemoglobin A1c -     CMP14+EGFR -     Hemoglobin A1c  Primary osteoarthritis of both knees -     Ambulatory referral to Orthopedic Surgery   Assessment & Plan Osteoarthritis of both knees Chronic osteoarthritis with moderate arthritis on imaging, causing joint pain and swelling. - Refer to [facility] in [place] for knee x-rays. - Prescribe meloxicam daily with food for a week, then as needed. - Advise Voltaren gel twice daily on knees. - Encourage turmeric and ginger in diet. - Recommend pool exercises.  Obstructive sleep apnea Non-compliance to CPAP therapy due to mucus production. - Encourage consistent CPAP use. - Suggest trying a different CPAP mask.  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 Current treatment with atorvastatin 20 mg due to previous muscle aches with rosuvastatin . Discussed potential statin intolerance and alternatives like Repatha. - Check CPK levels for statin intolerance. - Monitor atorvastatin response. - Consider Repatha if statin intolerance confirmed.  Obesity, class 1 Class 1 obesity. Discussed dietary modifications to reduce inflammation and support joint health. - Recommend anti-inflammatory or Mediterranean diet. - Encourage bone broth and collagen in diet.  Sjogren's syndrome Sjogren's syndrome with joint pain, under rheumatologist care. Monitoring for organ involvement and positive ANA. - Continue rheumatologist follow-up. - Check sed rate for inflammation.  Chronic constipation Chronic constipation with abdominal pain at incision site, possibly due to stool burden. - Recommend Miralax three times a week. - Monitor symptom changes with regular bowel movements.  Chronic  post-surgical pain at abdominal incision site Chronic pain possibly related to bowel movements and scar tissue. - Monitor pain in relation to bowel movements. - Consider further evaluation if pain persists.  Nontoxic single thyroid  nodule Nontoxic  single thyroid  nodule with benign biopsy results. - Monitor for symptoms such as difficulty swallowing or increased swelling.  Adult Wellness Visit Routine adult wellness visit. Discussed importance of pneumonia vaccination to prevent complications. - Request bone density scan from Dr. Johnnye. - Administer pneumonia vaccine later. Return for 1 year HM, . Patient was given opportunity to ask questions. Patient verbalized understanding of the plan and was able to repeat key elements of the plan. All questions were answered to their satisfaction.   [name], [name] LOISE Slocumb, MD, have reviewed all documentation for this visit. The documentation on 06/18/24 for the exam, diagnosis, procedures, and orders are all accurate and complete.

## 2024-06-18 NOTE — Patient Instructions (Addendum)
 Coenzyme Q10 Chicken/beef bone broth   Health Maintenance, Female Adopting a healthy lifestyle and getting preventive care are important in promoting health and wellness. Ask your health care provider about: The right schedule for you to have regular tests and exams. Things you can do on your own to prevent diseases and keep yourself healthy. What should I know about diet, weight, and exercise? Eat a healthy diet  Eat a diet that includes plenty of vegetables, fruits, low-fat dairy products, and lean protein. Do not eat a lot of foods that are high in solid fats, added sugars, or sodium. Maintain a healthy weight Body mass index (BMI) is used to identify weight problems. It estimates body fat based on height and weight. Your health care provider can help determine your BMI and help you achieve or maintain a healthy weight. Get regular exercise Get regular exercise. This is one of the most important things you can do for your health. Most adults should: Exercise for at least 150 minutes each week. The exercise should increase your heart rate and make you sweat (moderate-intensity exercise). Do strengthening exercises at least twice a week. This is in addition to the moderate-intensity exercise. Spend less time sitting. Even light physical activity can be beneficial. Watch cholesterol and blood lipids Have your blood tested for lipids and cholesterol at 67 years of age, then have this test every 5 years. Have your cholesterol levels checked more often if: Your lipid or cholesterol levels are high. You are older than 67 years of age. You are at high risk for heart disease. What should I know about cancer screening? Depending on your health history and family history, you may need to have cancer screening at various ages. This may include screening for: Breast cancer. Cervical cancer. Colorectal cancer. Skin cancer. Lung cancer. What should I know about heart disease, diabetes, and high  blood pressure? Blood pressure and heart disease High blood pressure causes heart disease and increases the risk of stroke. This is more likely to develop in people who have high blood pressure readings or are overweight. Have your blood pressure checked: Every 3-5 years if you are 38-27 years of age. Every year if you are 66 years old or older. Diabetes Have regular diabetes screenings. This checks your fasting blood sugar level. Have the screening done: Once every three years after age 65 if you are at a normal weight and have a low risk for diabetes. More often and at a younger age if you are overweight or have a high risk for diabetes. What should I know about preventing infection? Hepatitis B If you have a higher risk for hepatitis B, you should be screened for this virus. Talk with your health care provider to find out if you are at risk for hepatitis B infection. Hepatitis C Testing is recommended for: Everyone born from [birth year] through 1964. Anyone with known risk factors for hepatitis C. Sexually transmitted infections (STIs) Get screened for STIs, including gonorrhea and chlamydia, if: You are sexually active and are younger than 67 years of age. You are older than 67 years of age and your health care provider tells you that you are at risk for this type of infection. Your sexual activity has changed since you were last screened, and you are at increased risk for chlamydia or gonorrhea. Ask your health care provider if you are at risk. Ask your health care provider about whether you are at high risk for HIV. Your health care provider may recommend  a prescription medicine to help prevent HIV infection. If you choose to take medicine to prevent HIV, you should first get tested for HIV. You should then be tested every 3 months for as long as you are taking the medicine. Pregnancy If you are about to stop having your period (premenopausal) and you may become pregnant, seek counseling  before you get pregnant. Take 400 to 800 micrograms (mcg) of folic acid every day if you become pregnant. Ask for birth control (contraception) if you want to prevent pregnancy. Osteoporosis and menopause Osteoporosis is a disease in which the bones lose minerals and strength with aging. This can result in bone fractures. If you are 75 years old or older, or if you are at risk for osteoporosis and fractures, ask your health care provider if you should: Be screened for bone loss. Take a calcium  or vitamin D  supplement to lower your risk of fractures. Be given hormone replacement therapy (HRT) to treat symptoms of menopause. Follow these instructions at home: Alcohol use Do not drink alcohol if: Your health care provider tells you not to drink. You are pregnant, may be pregnant, or are planning to become pregnant. If you drink alcohol: Limit how much you have to: 0-1 drink a day. Know how much alcohol is in your drink. In the [place] one drink equals one 12 oz bottle of beer (355 mL), one 5 oz glass of wine (148 mL), or one 1 oz glass of hard liquor (44 mL). Lifestyle Do not use any products that contain nicotine or tobacco. These products include cigarettes, chewing tobacco, and vaping devices, such as e-cigarettes. If you need help quitting, ask your health care provider. Do not use street drugs. Do not share needles. Ask your health care provider for help if you need support or information about quitting drugs. General instructions Schedule regular health, dental, and eye exams. Stay current with your vaccines. Tell your health care provider if: You often feel depressed. You have ever been abused or do not feel safe at home. Summary Adopting a healthy lifestyle and getting preventive care are important in promoting health and wellness. Follow your health care provider's instructions about healthy diet, exercising, and getting tested or screened for diseases. Follow your health care  provider's instructions on monitoring your cholesterol and blood pressure. This information is not intended to replace advice given to you by your health care provider. Make sure you discuss any questions you have with your health care provider. Document Revised: 01/11/2021 Document Reviewed: 01/11/2021 Elsevier Patient Education  2024 [employer].

## 2024-06-19 DIAGNOSIS — G4733 Obstructive sleep apnea (adult) (pediatric): Secondary | ICD-10-CM | POA: Diagnosis not present

## 2024-06-19 DIAGNOSIS — R400 Somnolence: Secondary | ICD-10-CM | POA: Diagnosis not present

## 2024-06-19 DIAGNOSIS — K5909 Other constipation: Secondary | ICD-10-CM | POA: Insufficient documentation

## 2024-06-19 LAB — CMP14+EGFR
ALT: 19 IU/L (ref 0–32)
AST: 19 IU/L (ref 0–40)
Albumin: 4.8 g/dL (ref 3.9–4.9)
Alkaline Phosphatase: 108 IU/L (ref 49–135)
BUN/Creatinine Ratio: 19 (ref 12–28)
BUN: 16 mg/dL (ref 8–27)
Bilirubin Total: 1.2 mg/dL (ref 0.0–1.2)
CO2: 23 mmol/L (ref 20–29)
Calcium: 9.7 mg/dL (ref 8.7–10.3)
Chloride: 104 mmol/L (ref 96–106)
Creatinine, Ser: 0.83 mg/dL (ref 0.57–1.00)
Globulin, Total: 2.1 g/dL (ref 1.5–4.5)
Glucose: 75 mg/dL (ref 70–99)
Potassium: 5 mmol/L (ref 3.5–5.2)
Sodium: 143 mmol/L (ref 134–144)
Total Protein: 6.9 g/dL (ref 6.0–8.5)
eGFR: 78 mL/min/1.73 (ref >59)

## 2024-06-19 LAB — HEMOGLOBIN A1C
Est. average glucose Bld gHb Est-mCnc: 117 mg/dL
Hgb A1c MFr Bld: 5.7 % — ABNORMAL HIGH (ref 4.8–5.6)

## 2024-06-19 LAB — SEDIMENTATION RATE: Sed Rate: 2 mm/h (ref 0–40)

## 2024-06-19 LAB — CK: Total CK: 97 U/L (ref 32–182)

## 2024-06-19 NOTE — Assessment & Plan Note (Signed)
 Chronic osteoarthritis with moderate arthritis on imaging, causing joint pain and swelling. - Refer to [facility] in [place] for knee x-rays. - Prescribe meloxicam daily with food for a week, then as needed. - Advise Voltaren gel twice daily on knees. - Encourage turmeric and ginger in diet. - Recommend pool exercises.

## 2024-06-19 NOTE — Assessment & Plan Note (Addendum)
 Elevated lipoprotein(a) increases cardiovascular risk. Current treatment with atorvastatin 20 mg due to previous muscle aches with rosuvastatin . Discussed potential statin intolerance and alternatives like Repatha. - Check CPK levels for statin intolerance. - Start taking Coenzyme Q10  - Monitor atorvastatin response. - Consider Repatha if statin intolerance confirmed.

## 2024-06-19 NOTE — Assessment & Plan Note (Signed)
 Chronic constipation with abdominal pain at incision site, possibly due to stool burden. - Recommend Miralax three times a week. - Monitor symptom changes with regular bowel movements. - She is encouraged to let me know if her sx persist.  - She was also advised of large stool burden seen on previous imaging study - Chronic abdominal pain possibly related to bowel movements and scar tissue. - Monitor pain in relation to bowel movements. - Consider further evaluation if pain persists.

## 2024-06-19 NOTE — Assessment & Plan Note (Signed)
 Class 1 obesity. Discussed dietary modifications to reduce inflammation and support joint health. - Recommend anti-inflammatory or Mediterranean diet. - Encourage bone broth and collagen in diet.

## 2024-06-19 NOTE — Assessment & Plan Note (Signed)
 Sjogren's syndrome with joint pain, under rheumatologist care. Monitoring for organ involvement and positive ANA. - Continue rheumatologist follow-up. - Check sed rate for inflammation.

## 2024-06-19 NOTE — Assessment & Plan Note (Addendum)
 A full exam was performed.  Importance of monthly self breast exams was discussed with the patient.  She is advised to get 30-45 minutes of regular exercise, no less than four to five days per week. Both weight-bearing and aerobic exercises are recommended.  She is advised to follow a healthy diet with at least six fruits/veggies per day, decrease intake of red meat and other saturated fats and to increase fish intake to twice weekly.  Meats/fish should not be fried -- baked, boiled or broiled is preferable. It is also important to cut back on your sugar intake.  Be sure to read labels - try to avoid anything with added sugar, high fructose corn syrup or other sweeteners.  If you must use a sweetener, you can try stevia or monkfruit.  It is also important to avoid artificially sweetened foods/beverages and diet drinks. Lastly, wear SPF 50 sunscreen on exposed skin and when in direct sunlight for an extended period of time.  Be sure to avoid fast food restaurants and aim for at least 60 ounces of water daily.   , - Request bone density scan from Dr. Johnnye. - Administer pneumonia vaccine later.

## 2024-06-19 NOTE — Assessment & Plan Note (Signed)
 Obstructive sleep apnea Non-compliance to CPAP therapy due to mucus production. - Encourage consistent CPAP use. - Suggest trying a different CPAP mask.

## 2024-06-21 LAB — HM DEXA SCAN: HM Dexa Scan: NORMAL

## 2024-06-24 ENCOUNTER — Ambulatory Visit: Payer: Self-pay | Admitting: Internal Medicine

## 2024-06-28 ENCOUNTER — Ambulatory Visit: Payer: Self-pay | Admitting: Internal Medicine

## 2024-07-01 DIAGNOSIS — M25562 Pain in left knee: Secondary | ICD-10-CM | POA: Diagnosis not present

## 2024-07-01 DIAGNOSIS — M25561 Pain in right knee: Secondary | ICD-10-CM | POA: Diagnosis not present

## 2024-07-02 ENCOUNTER — Ambulatory Visit

## 2024-07-08 DIAGNOSIS — R400 Somnolence: Secondary | ICD-10-CM | POA: Diagnosis not present

## 2024-07-20 DIAGNOSIS — R400 Somnolence: Secondary | ICD-10-CM | POA: Diagnosis not present

## 2024-07-20 DIAGNOSIS — G4733 Obstructive sleep apnea (adult) (pediatric): Secondary | ICD-10-CM | POA: Diagnosis not present

## 2024-07-30 ENCOUNTER — Encounter: Payer: Self-pay | Admitting: Internal Medicine

## 2024-07-30 DIAGNOSIS — R931 Abnormal findings on diagnostic imaging of heart and coronary circulation: Secondary | ICD-10-CM

## 2024-08-04 NOTE — Telephone Encounter (Signed)
 Patient has CAC and NSAIDs are not optimal.  If PCP is unable to use other medications, advise cautious usage.   Please see the MyChart message reply(ies) for my assessment and plan.    This patient gave consent for this Medical Advice Message and is aware that it may result in a bill to [employer], as well as the possibility of receiving a bill for a co-payment or deductible. They are an established patient, but are not seeking medical advice exclusively about a problem treated during an in person or video visit in the last seven days. I did not recommend an in person or video visit within seven days of my reply.    I spent a total of 10 minutes cumulative time within 7 days through [employer].  Stanly DELENA Leavens, MD

## 2024-08-06 ENCOUNTER — Ambulatory Visit: Payer: Self-pay

## 2024-08-06 VITALS — BP 110/70 | HR 79 | Temp 98.3°F | Ht 66.0 in | Wt 192.0 lb

## 2024-08-06 DIAGNOSIS — Z23 Encounter for immunization: Secondary | ICD-10-CM | POA: Diagnosis not present

## 2024-08-06 NOTE — Progress Notes (Signed)
 Patient presents today for a NV to get her pneumonia 20 vaccine. Patient received her injection in her right deltoid. Patient tolerated injection well. YL,RMA

## 2024-08-13 NOTE — Progress Notes (Deleted)
 Office Visit Note  Patient: [name] [name]             Date of Birth: 03/26/1956           MRN: [id number]             PCP: Jarold Medici, MD Referring: Jarold Medici, MD Visit Date: 08/27/2024 Occupation: Data Unavailable  Subjective:  No chief complaint on file.   History of Present Illness: [name] [name] is a 68 y.o. female ***     Activities of Daily Living:  Patient reports morning stiffness for *** {minute/hour:19697}.   Patient {ACTIONS;DENIES/REPORTS:21021675::Denies} nocturnal pain.  Difficulty dressing/grooming: {ACTIONS;DENIES/REPORTS:21021675::Denies} Difficulty climbing stairs: {ACTIONS;DENIES/REPORTS:21021675::Denies} Difficulty getting out of chair: {ACTIONS;DENIES/REPORTS:21021675::Denies} Difficulty using hands for taps, buttons, cutlery, and/or writing: {ACTIONS;DENIES/REPORTS:21021675::Denies}  No Rheumatology ROS completed.   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   Osteoarthritis 06/07/2023   Other cervical disc displacement, unspecified cervical region 06/07/2023   Other cervical disc degeneration, unspecified cervical region 06/07/2023   Other spondylosis with radiculopathy, cervical region 06/07/2023   Paresthesia of left arm 06/04/2023   Upper airway cough syndrome 05/05/2023   Coronary artery calcification 11/03/2022   Sjogren's syndrome 07/01/2022   Acute pain of left shoulder 04/23/2022   Soft tissue mass 04/23/2022   Postnasal drip 04/23/2022   Class 1 obesity due to excess calories with serious comorbidity and body mass index (BMI) of  31.0 to 31.9 in adult 04/23/2022   Dysfunction of both eustachian tubes 05/24/2021   Primary osteoarthritis of both knees 07/30/2018   Primary osteoarthritis of both hands 07/30/2018   Foot pain 05/15/2018   OSA (obstructive sleep apnea) 02/23/2017   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   Snoring 03/25/2015   Hypersomnia with sleep apnea 03/25/2015   Body mass index (BMI) of 30.0-30.9 in adult 02/20/2015   Bilateral dry eyes 10/30/2013   MGD (meibomian gland disease) 10/30/2013   Nuclear sclerosis of both eyes 10/30/2013   Multinodular goiter (nontoxic) 02/08/2012   Acute pancreatitis 10/26/2011   Nausea and vomiting in adult 10/26/2011   Dehydration 10/26/2011   Gastroenteritis 10/26/2011   Hypokalemia 10/26/2011   Elevated liver function tests 10/26/2011   Acid reflux    Thyroid  nodule    Abdominal bloating 07/08/2011    Past Medical History:  Diagnosis Date   Abnormal liver enzymes    Acid reflux    Arthritis    back and neck   Bloating    Bronchitis    Chest pain, unspecified    Diarrhea    Dyspepsia    Encounter for Medicare annual wellness exam 06/13/2023   Esophageal reflux    H/O acute pancreatitis    H/O hiatal hernia    Headache(784.0)    migraines   Hemorrhage of rectum and anus    Hypercholesteremia    Nausea    Sjogren's disease    Sleep apnea    Spinal stenosis, cervical region    Thyroid  disease    Thyroid  nodule     Family History  Problem Relation Age of Onset   Colon cancer Mother    Cancer Mother    Early  death Mother    Hearing loss Mother    Early death Father    Pancreatic cancer Brother    Healthy Daughter    Healthy Son    Heart disease Maternal Grandfather    Hypertension Maternal Grandfather    Cancer Brother    Early death Brother    Past Surgical History:  Procedure Laterality Date   ABDOMINAL HYSTERECTOMY  2007   total with BSO   BIOPSY THYROID   02/16/2023   capsulorhexis Left 09/13/2023   (eye procedure)   CHOLECYSTECTOMY   1989   COLONOSCOPY     ECTOPIC PREGNANCY SURGERY  1984   ESOPHAGOGASTRODUODENOSCOPY     EUS  12/21/2011   Procedure: ESOPHAGEAL ENDOSCOPIC ULTRASOUND (EUS) RADIAL;  Surgeon: Elsie Cree, MD;  Location: [facility];  Service: Endoscopy;  Laterality: N/A;   eye cautery     EYE SURGERY     lasik   EYE SURGERY Left 2004   cataract extraction    KNEE ARTHROSCOPY Right 06/11/2020   torn meniscus    Social History   Tobacco Use   Smoking status: Never    Passive exposure: Past   Smokeless tobacco: Never  Vaping Use   Vaping status: Never Used  Substance Use Topics   Alcohol use: Yes    Comment: 1-2 times a year   Drug use: No   Social History   Social History Narrative   Married.   Drinks occasional sweet tea.   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     Immunization History  Administered Date(s) Administered   Fluad Trivalent(High Dose 65+) 05/16/2023   INFLUENZA, HIGH DOSE SEASONAL PF 06/18/2024   Influenza,inj,Quad PF,6+ Mos 06/24/2022   Influenza-Unspecified 09/01/2022   PFIZER(Purple Top)SARS-COV-2 Vaccination 11/22/2019, 12/13/2019, 07/01/2020, 03/26/2021   PNEUMOCOCCAL CONJUGATE-20 08/06/2024   Tdap 07/13/2017   Zoster Recombinant(Shingrix ) 02/15/2022, 05/10/2022     Objective: Vital Signs: There were no vitals taken for this visit.   Physical Exam   Musculoskeletal Exam: ***  CDAI Exam: CDAI Score: -- Patient Global: --; Provider Global: -- Swollen: --; Tender: -- Joint Exam 08/27/2024   No joint exam has been documented for this visit   There is currently no information documented on the homunculus. Go to the Rheumatology activity and complete the homunculus joint exam.  Investigation: No additional findings.  Imaging: No results found.  Recent Labs: Lab Results  Component Value Date   WBC 5.2 02/26/2024   HGB 13.9 02/26/2024   PLT 220 02/26/2024   NA 143 06/18/2024   K 5.0 06/18/2024   CL 104  06/18/2024   CO2 23 06/18/2024   GLUCOSE 75 06/18/2024   BUN 16 06/18/2024   CREATININE 0.83 06/18/2024   BILITOT 1.2 06/18/2024   ALKPHOS 108 06/18/2024   AST 19 06/18/2024   ALT 19 06/18/2024   PROT 6.9 06/18/2024   ALBUMIN 4.8 06/18/2024   CALCIUM  9.7 06/18/2024   GFRAA 88 09/29/2020    Speciality Comments: PLQ eye exam: 08/27/2019 normal. [facility]. Follow up in 6 months. PLQ started 07/2019  Procedures:  No procedures performed Allergies: Morphine and codeine, Other, and Sulfa antibiotics   Assessment / Plan:     Visit Diagnoses: No diagnosis found.  Orders: No orders of the defined types were placed in this encounter.  No orders of the defined types were placed in this encounter.   Face-to-face time spent with patient was *** minutes. Greater than 50% of time was spent in counseling  and coordination of care.  Follow-Up Instructions: No follow-ups on file.   Daved JAYSON Gavel, CMA  Note - This record has been created using Animal nutritionist.  Chart creation errors have been sought, but may not always  have been located. Such creation errors do not reflect on  the standard of medical care.

## 2024-08-16 NOTE — Progress Notes (Signed)
 SET UP 06-19-24

## 2024-08-19 ENCOUNTER — Encounter: Payer: Self-pay | Admitting: Neurology

## 2024-08-19 ENCOUNTER — Ambulatory Visit: Admitting: Neurology

## 2024-08-19 VITALS — BP 117/69 | HR 68 | Ht 66.0 in | Wt 194.0 lb

## 2024-08-19 DIAGNOSIS — G4733 Obstructive sleep apnea (adult) (pediatric): Secondary | ICD-10-CM

## 2024-08-19 DIAGNOSIS — R400 Somnolence: Secondary | ICD-10-CM | POA: Diagnosis not present

## 2024-08-19 NOTE — Progress Notes (Signed)
 Provider:  Dedra Gores, MD  Primary Care Physician:  Jarold Medici, MD [address] [place] [address]     Referring Provider: Jarold Medici, Md [address] [place],  [place] [address]          Chief Complaint according to patient   Patient presents with:                HISTORY OF PRESENT ILLNESS:  [name] [name] is a 68 y.o. female patient who is here for revisit 08/19/2024 for  new CPAP compliance, machine was set up 06-2024, and she is using it very compliantly. The AHI is 2/h. Her air leak is high, the average 95% pressure is 9.8 cm CPAP.   She is sleeping well with the machine now, but the first 30 days were hard, high air leak- her husband has been bothered by it. She has Sjgren disease, some high frequency hearing loss, knee pain and sleeps 5.5 hours on average with it.   Audiological evaluation:  [facility]  Audiometry: Right ear shows normal thresholds from 250 Hz through 6000 Hz with mild loss at 8000 Hz. The left ear shows normal thresholds for 250 Hz through 3000 Hz with mild loss at 4006 1000 Hz further downsloping to severe loss at 8000 Hz. SRT's: The right ear shows 15 dB HL. The left ear shows 20 dB HL. Word recognition scores: Right ear shows 96% at 60 dB HL with 30 dB EM. The left ear shows 96% at 65 dB HL with 35 dB EM. Speech intelligibility index: The right ear shows 95%. The left ear shows 85%. Tympanometry: Type Ad tympanograms in both ears  .    Here to reestablish care for sleep at [facility]. She had a PSG in 2016 and was started on CPAP.  GERD was bad at the time, too . States that she stopped using PAP  because she would get a lot of respiratory issues , was getting sick with it. She states she was cleaning and everything but she found that she had a increase in mucous and was not able to tolerate it.  She sleeps 4-6 hrs. Denies waking up and husband advised she doesn't snore like she used to. She has  lost weight.  just recently has started to wake up feeling drained         HISTORY OF PRESENT ILLNESS:  [name] [name] is a 68 y.o. female patient who is seen upon a new PCP referral on 03/11/2024 , here to reestablish care for sleep at [facility]. She had a PSG in 2016 and was started on CPAP.  GERD was bad at the time, too . States that she stopped using PAP  because she would get a lot of respiratory issues , was getting sick with it. She states she was cleaning and everything but she found that she had a increase in mucous and was not able to tolerate it.  She sleeps 4-6 hrs. Denies waking up and husband advised she doesn't snore like she used to. She has lost weight. Prediabetic and has Sjoegrens syndrome  just recently has started to wake up feeling drained.     Chief concern according to patient :  see above, now retired and feeling overall well. She is active with many things.  She watches her grand kids, age 61 and 2.  Gardening.    I have the pleasure of seeing [name] Elwell  [name] 03/11/24 a right-handed female with a history of a sleep disorder. Sjoegrens syndrome  GERD. Had thyroid  biopsies, benign result/    The patient had the first sleep study in the year 2016 here at [facility]-   ESS was 17 points then (!)    REASON FOR VISIT: Obstructive sleep apnea needs CPAP supplies HISTORY FROM: Patient     Interval history from 03/28/2017,  I had the pleasure of seeing [name] [name] who has been an established CPAP user.  The patient had reported an increasing level of fatigue when she spoke to her primary care provider, Dr. Bonnie Gaskins, and to her cardiologist, Dr. Ladona.  Both asked her to follow up with me. She was just seen on 02/23/2017 with a high compliance with CPAP use, low residual AHI of 1.3, and no need for changes in the settings was seen. The patient reports rare palpitations not necessarily at night, and she has not make the connection to caffeine intake yet.  She states that she drinks one  glass of tea ice tea, and that 3 times a week. She is not a soda drinker and she does not drink coffee. She is also not as sleepy as she used to be before CPAP was initiated. She is here to be evaluated for left leg weakness and numbness.  She reports that the numbness and weakness has been happening off and on for years. She has never fallen, she has no pain in the leg. She feels that the weakness is related to the knee joint. She also reports that her right knee is now starting to bother her as well. The symptoms are mostly felt when she is rising up from a seated position starting to move. She feels as if they could give out. She feels the knees are big. She reports loss of muscle bulk at the hip flexors, quadriceps.        HISTORY OF PRESENT ILLNESS:UPDATE 02/23/2017 CM [name] [name], 68 year old female returns for follow-up with her last visit being July 2016. She did not show for follow-up appointments. She needs new CPAP supplies and a compliance report. 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 Her CPAP report from 01/23/2017 to 02/21/2017 greater than 4 hours usage 27 days for 90%. Average usage 5 hours 50 minutes. Pressure 5 cm minimum and max 15. EPR full-time level III AHI 1.3. She has an appointment with Dr. Chalice on July 24 to address the weakness in the left lower extremity. She returns for reevaluation. here to follow-up for CPAP compliance and CPAP supplies.CPAP report from 01/23/2017 to 02/21/2017 greater than 4 hours usage 27 days for 90%. Average usage 5 hours 50 minutes. Pressure 5 cm minimum and max 15. EPR full-time level 3 AHI 1.3.The patient is a current patient of Dr. Chalice  who is out of the office today . This note is sent to the work in doctor.           Review of Systems: Out of a complete 14 system review, the patient complains of only the following symptoms, and all other reviewed systems are negative.:   SLEEPINESS  ?  How likely are you to doze in the following situations: 0 = not likely, 1 = slight chance, 2 = moderate chance, 3 = high chance  Sitting and Reading? Watching Television? Sitting inactive in a public place (theater or meeting)? Lying down in the afternoon when circumstances permit? Sitting and talking to someone?  Sitting quietly after lunch without alcohol? In a car, while stopped for a few minutes in traffic? As a passenger in a car for an hour without a break?  Total = 4/ 24  FSS at 11/ 64 GDS:  3/ 15   Pre -CPAP ESS was 17        Social History   Socioeconomic History   Marital status: Married    Spouse name: Not on file   Number of children: 2   Years of education: 16    Highest education level: Bachelor's degree (e.g., BA, AB, BS)  Occupational History   Not on file  Logistics management,  retired 2022.    Tobacco Use   Smoking status: Never    Passive exposure: Past   Smokeless tobacco: Never  Vaping Use   Vaping status: Never Used  Substance and Sexual Activity   Alcohol use: Yes    Comment: 1-2 times a year   Drug use: No   Sexual activity: Yes    Birth control/protection: None    Comment: Hysterectomy  Other Topics Concern   Not on file  Social History Narrative   Married.   Drinks occasional sweet tea.   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   Social Drivers of Health     Family History  Problem Relation Age of Onset   Colon cancer Mother    Cancer Mother    Early death Mother    Hearing loss Mother    Early death Father    Pancreatic cancer Brother    Healthy Daughter    Healthy Son    Heart disease Maternal Grandfather    Hypertension Maternal Grandfather    Cancer Brother    Early death Brother     Past Medical History:  Diagnosis Date   Abnormal liver enzymes    Acid reflux    Arthritis    back and neck       Bronchitis    Chest pain, unspecified    Diarrhea    Dyspepsia, GERD-  improved     Encounter for Medicare annual wellness exam 06/13/2023   Esophageal reflux    H/O acute pancreatitis    H/O hiatal hernia    Headache(784.0)    migraines   Hemorrhage of rectum and anus    Hypercholesteremia    Nausea    Sjogren's disease    Sleep apnea (OSA on CPAP )     Spinal stenosis, cervical region        Thyroid  nodules, biopsied in 2025     Past Surgical History:  Procedure Laterality Date   ABDOMINAL HYSTERECTOMY  2007   total with BSO   BIOPSY THYROID   02/16/2023   capsulorhexis Left 09/13/2023   (eye procedure)   CHOLECYSTECTOMY  1989   COLONOSCOPY     ECTOPIC PREGNANCY SURGERY  1984   ESOPHAGOGASTRODUODENOSCOPY     EUS  12/21/2011   Procedure: ESOPHAGEAL ENDOSCOPIC ULTRASOUND (EUS) RADIAL;  Surgeon: Elsie Cree, MD;  Location: [facility];  Service: Endoscopy;  Laterality: N/A;   eye cautery     EYE SURGERY     lasik   EYE SURGERY Left 2004   cataract extraction    KNEE ARTHROSCOPY Right 06/11/2020   torn meniscus      4 Result Notes     View Follow-Up Encounter          Component Ref Range & Units (hover) 5 mo ago (02/26/24) 1 yr  ago (04/26/23) 1 yr ago (10/27/22) 2 yr ago (06/02/22) 3 yr ago (05/21/21) 3 yr ago (09/29/20) 5 yr ago (07/16/19)  ANA Titer 1 1:40 High  1:40 High  CM 1:80 High  CM 1:80 High  CM 1:80 High  CM 1:80 High  CM 1:80 High  CM  Comment: A low level ANA titer may be present in pre-clinical autoimmune diseases and normal individuals.                 Reference Range                 <1:40        Negative                 1:40-1:80    Low Antibody Level                 >1:80        Elevated Antibody Level .  ANA Pattern 1 Nuclear, Speckled Abnormal  Nuclear, Fine Speckled Abnormal  CM Nuclear, Speckled Abnormal  CM Nuclear, Speckled Abnormal  CM Nuclear, Speckled Abnormal  CM Nuclear, Speckled Abnormal  CM Nuclear, Speckled Abnormal  CM  Comment: Speckled pattern is associated with mixed connective tissue disease  (MCTD), systemic lupus erythematosus (SLE), Sjogren's syndrome, dermatomyositis, and systemic sclerosis/polymyositis overlap. . AC-2,4,5,29: Speckled . International Consensus on ANA Patterns (severties.uy)  Resulting Agency [facility]        DIAGNOSTIC DATA (LABS, IMAGING, TESTING) - I reviewed patient records, labs, notes, testing and imaging myself where available.  Lab Results  Component Value Date   WBC 5.2 02/26/2024   HGB 13.9 02/26/2024   HCT 42.5 02/26/2024   MCV 87.4 02/26/2024   PLT 220 02/26/2024      Component Value Date/Time   NA 143 06/18/2024 1207   K 5.0 06/18/2024 1207   CL 104 06/18/2024 1207   CO2 23 06/18/2024 1207   GLUCOSE 75 06/18/2024 1207   GLUCOSE 90 02/26/2024 1012   BUN 16 06/18/2024 1207   CREATININE 0.83 06/18/2024 1207   CREATININE 0.71 02/26/2024 1012   CALCIUM  9.7 06/18/2024 1207   PROT 6.9 06/18/2024 1207   ALBUMIN 4.8 06/18/2024 1207   AST 19 06/18/2024 1207   ALT 19 06/18/2024 1207   ALKPHOS 108 06/18/2024 1207   BILITOT 1.2 06/18/2024 1207   GFRNONAA >60 06/30/2022 1300   GFRNONAA 76 09/29/2020 1431   GFRAA 88 09/29/2020 1431   Lab Results  Component Value Date   CHOL 175 03/27/2024   HDL 69 03/27/2024   LDLCALC 96 03/27/2024   LDLDIRECT 149 (H) 11/03/2022   TRIG 48 03/27/2024   CHOLHDL 2.5 03/27/2024   Lab Results  Component Value Date   HGBA1C 5.7 (H) 06/18/2024   Lab Results  Component Value Date   VITAMINB12 1,425 (H) 05/16/2023   Lab Results  Component Value Date   TSH 0.98 09/28/2023    PHYSICAL EXAM:  Vitals:   08/19/24 0956  BP: 117/69  Pulse: 68   No data found. Body mass index is 31.31 kg/m.   Wt Readings from Last 3 Encounters:  08/19/24 194 lb (88 kg)  08/06/24 192 lb (87.1 kg)  06/18/24 192 lb 9.6 oz (87.4 kg)     Ht Readings from Last 3  Encounters:  08/19/24 5' 6 (1.676 m)  08/06/24 5' 6 (1.676 m)  06/18/24 5' 6 (1.676 m)  General: The patient is awake, alert and appears not in acute distress and groomed. Head: Normocephalic, atraumatic.  Neck is supple. The patient is awake, alert and appears not in acute distress. The patient is well groomed. Head: Normocephalic, atraumatic. Neck is supple. Mallampati 2,  neck circumference: 16 inches . Nasal airflow fully patent.   Retrognathia is  seen.  Dental status:  Cardiovascular:  Regular rate and cardiac rhythm by pulse,  without distended neck veins. Respiratory: Lungs are clear to auscultation.  Skin:  Without evidence of ankle edema, or rash. Trunk: The patient's posture is erect.   NEUROLOGIC EXAM: The patient is awake and alert, oriented to place and time.   Memory subjective described as intact.  Attention span & concentration ability appears normal.  Speech is fluent,  without  dysarthria, dysphonia or aphasia.  Mood and affect are appropriate.   Cranial nerves: no loss of smell or taste reported  Pupils are equal and briskly reactive to light. Funduscopic exam deferred. .  Extraocular movements in vertical and horizontal planes were intact and without nystagmus. No Diplopia. Visual fields by finger perimetry are intact. Hearing was intact to soft voice and finger rubbing.    Facial sensation intact to fine touch.  Facial motor strength is symmetric and tongue and uvula move midline.  Neck ROM : rotation, tilt and flexion extension were normal for age and shoulder shrug was symmetrical.    Motor exam:  Symmetric bulk, tone and ROM.   Normal tone without cog -wheeling, symmetric grip strength .   ASSESSMENT AND PLAN :   68 y.o. year old female  here with: [name] [name] is a 68 y.o. female patient who  had testing by a PSG in 2016, was dx with OSA  and was started on CPAP. She had at the time endorsed the ESS at 17/ 24 points.  GERD was bad at the  time, she became prediabetic and has Sjoegrens syndrome. She has thyroid  nodules.  She stopped using PAP because of respiratory issues , she was getting sick with it, produced increasingly more mucous and was congested -not able to tolerate PAP She sleeps 4-6 hours , denies waking up and husband reported that she doesn't snore like she used to since she has lost weight. Prediabetic and has Sjoegrens syndrome  Just recently has started to wake up feeling drained- a more severe fatigue than she has known before. AHI was 7.4/h  very mild apnea. OSA -REM dependent .       1) longstanding OSA on CPAP, this time she tolerates the devise, auto settings and is sleeping well, her ESS is 4 in comparison to pre CPAP ESS score of 17 points.  Pre last HST ESS was 11  points.  Much lower now.   Air leak : She uses a nasal pillow and has tiny nostrils, XS size airtouch nasal cradle is very comfortable but dislodges and the air leak is high. Can we ask DME to gie her nasal pillows for try -on ? Needing small size.    She reports no aerophagia.    2) nocturia once and not every night , pre diabetic HBA1 c was 5.7 and Sjgren disease followed by Maya Nash, MD   3) sleep duration is 5.5- just short of 6 hours   RV in 12 months , ESS. FSS and [employer].     I would like to thank Jarold Medici, Md [address] [place],  [place] [address] for  allowing me to meet with this pleasant patient.   Sleep Clinic Patients are generally offered input on sleep hygiene, life style changes and how to improve compliance with medical treatment where applicable. Review and reiteration of good sleep hygiene measures is offered to any sleep clinic patient, be it in the first consultation or with any follow up visits.    Any patient with sleepiness should be cautioned not to drive, work at heights, or operate dangerous or heavy equipment when feeling tired or sleepy.      The patient will be seen  in follow-up in the sleep clinic at [facility] for discussion of test results, sleep related symptoms and treatment compliance review, further management strategies, etc.   The referring provider will be notified of the test results.   The patient's condition requires frequent monitoring and adjustments in the treatment plan, reflecting the ongoing complexity of care.  This provider is the continuing focal point for all needed services for this condition.  After spending a total time of  30  minutes face to face and time for  history taking, physical and neurologic examination, review of laboratory studies,  personal review of imaging studies, reports and results of other testing and review of referral information / records as far as provided in visit,   Electronically signed by: Dedra Gores, MD 08/19/2024 10:30 AM  [facility] and [facility] Sleep Board certified by The [employer] of Sleep Medicine and Diplomate of the [employer] of Sleep Medicine. Board certified In Neurology through the ABPN, Fellow of the [employer] of Neurology.

## 2024-08-19 NOTE — Patient Instructions (Signed)
 ASSESSMENT AND PLAN :    68 y.o. year old female  here with:     1) longstanding OSA on CPAP, this time she tolerates the devise, auto settings and is sleeping well, her ESS is 4 in comparison to pre CPAP ESS score of 17 points.    Air leak : She uses a nasal pillow and has tiny nostrils, XS size airtouch nasal cradle is very comfortable but dislodges and the air leak is high. Can we ask DME to gie her nasal pillows for try -on ? Needing small size.    She reports no aerophagia.      2) nocturia once and not every night , she remains pre diabetic (HBA1 c was 5.7 ) and has Sjgren disease followed by Maya Nash, MD,. No dry mouth !     3) sleep duration is 5.5- just short of 6 hours    RV in 12 months , ESS. F[employer] and [employer].        I would like to thank Jarold Medici, Md [address] [place] [address] [place],  [place] [id number] for allowing me to meet with this pleasant patient.

## 2024-08-21 ENCOUNTER — Encounter (INDEPENDENT_AMBULATORY_CARE_PROVIDER_SITE_OTHER): Payer: Self-pay | Admitting: Internal Medicine

## 2024-08-21 DIAGNOSIS — T466X5A Adverse effect of antihyperlipidemic and antiarteriosclerotic drugs, initial encounter: Secondary | ICD-10-CM

## 2024-08-21 DIAGNOSIS — M791 Myalgia, unspecified site: Secondary | ICD-10-CM

## 2024-08-23 NOTE — Telephone Encounter (Signed)
 Chart reviewed: - 68 yo F with CAC and statin associatied myalgia (rosuvastatin , atorvastatin ).  - will do trial of zetia  and recheck labs in three months with Pharm D visit for discussion of PCKS9i in this is ineffective  Stanly Leavens, MD FASE [facility] Cardiologist [facility]  [address] [place], [place] [address] [phone]  7:32 PM  Please see the MyChart message reply(ies) for my assessment and plan.    This patient gave consent for this Medical Advice Message and is aware that it may result in a bill to [employer], as well as the possibility of receiving a bill for a co-payment or deductible. They are an established patient, but are not seeking medical advice exclusively about a problem treated during an in person or video visit in the last seven days. I did not recommend an in person or video visit within seven days of my reply.    I spent a total of 7 minutes cumulative time within 7 days through [employer].  Stanly DELENA Leavens, MD

## 2024-08-26 ENCOUNTER — Ambulatory Visit: Attending: Rheumatology | Admitting: Rheumatology

## 2024-08-26 ENCOUNTER — Encounter: Payer: Self-pay | Admitting: Rheumatology

## 2024-08-26 VITALS — BP 122/85 | HR 83 | Temp 97.9°F | Resp 15 | Ht 66.0 in | Wt 191.4 lb

## 2024-08-26 DIAGNOSIS — Z8669 Personal history of other diseases of the nervous system and sense organs: Secondary | ICD-10-CM | POA: Diagnosis not present

## 2024-08-26 DIAGNOSIS — Z8639 Personal history of other endocrine, nutritional and metabolic disease: Secondary | ICD-10-CM | POA: Diagnosis not present

## 2024-08-26 DIAGNOSIS — M7062 Trochanteric bursitis, left hip: Secondary | ICD-10-CM | POA: Diagnosis not present

## 2024-08-26 DIAGNOSIS — M7061 Trochanteric bursitis, right hip: Secondary | ICD-10-CM | POA: Diagnosis not present

## 2024-08-26 DIAGNOSIS — M3509 Sicca syndrome with other organ involvement: Secondary | ICD-10-CM | POA: Diagnosis not present

## 2024-08-26 DIAGNOSIS — M25461 Effusion, right knee: Secondary | ICD-10-CM

## 2024-08-26 DIAGNOSIS — R053 Chronic cough: Secondary | ICD-10-CM | POA: Diagnosis not present

## 2024-08-26 DIAGNOSIS — M19042 Primary osteoarthritis, left hand: Secondary | ICD-10-CM | POA: Diagnosis not present

## 2024-08-26 DIAGNOSIS — M19041 Primary osteoarthritis, right hand: Secondary | ICD-10-CM

## 2024-08-26 DIAGNOSIS — M170 Bilateral primary osteoarthritis of knee: Secondary | ICD-10-CM | POA: Diagnosis not present

## 2024-08-26 DIAGNOSIS — R7689 Other specified abnormal immunological findings in serum: Secondary | ICD-10-CM

## 2024-08-26 DIAGNOSIS — R5383 Other fatigue: Secondary | ICD-10-CM | POA: Diagnosis not present

## 2024-08-26 NOTE — Patient Instructions (Addendum)
 Exercises for Chronic Knee Pain Chronic knee pain is pain that lasts longer than 3 months. For most people with chronic knee pain, exercise and weight loss is an important part of treatment. Your health care provider may want you to focus on: Making the muscles that support your knee stronger. This can take pressure off your knee and reduce pain. Preventing knee stiffness. How far you can move your knee, keeping it there or making it farther. Losing weight (if this applies) to take pressure off your knee, lower your risk for injury, and make it easier for you to exercise. Your provider will help you make an exercise program that fits your needs and physical abilities. Below are simple, low-impact exercises you can do at home. Ask your provider or physical therapist how often you should do your exercise program and how many times to repeat each exercise. General safety tips  Get your provider's approval before doing any exercises. Start slowly and stop any time you feel pain. Do not exercise if your knee pain is flaring up. Warm up first. Stretching a cold muscle can cause an injury. Do 5-10 minutes of easy movement or light stretching before beginning your exercises. Do 5-10 minutes of low-impact activity (like walking or cycling) before starting strengthening exercises. Contact your provider any time you have pain during or after exercising. Exercise can cause discomfort but should not be painful. It is normal to be a little stiff or sore after exercising. Stretching and range-of-motion exercises Front thigh stretch  Stand up straight and support your body by holding on to a chair or resting one hand on a wall. With your legs straight and close together, bend one knee to lift your heel up toward your butt. Using one hand for support, grab your ankle with your free hand. Pull your foot up closer toward your butt to feel the stretch in front of your thigh. Hold the stretch for 30  seconds. Repeat __________ times. Complete this exercise __________ times a day. Back thigh stretch  Sit on the floor with your back straight and your legs out straight in front of you. Place the palms of your hands on the floor and slide them toward your feet as you bend at the hip. Try to touch your nose to your knees and feel the stretch in the back of your thighs. Hold for 30 seconds. Repeat __________ times. Complete this exercise __________ times a day. Calf stretch  Stand facing a wall. Place the palms of your hands flat against the wall, arms extended, and lean slightly against the wall. Get into a lunge position with one leg bent at the knee and the other leg stretched out straight behind you. Keep both feet facing the wall and increase the bend in your knee while keeping the heel of the other leg flat on the ground. You should feel the stretch in your calf. Hold for 30 seconds. Repeat __________ times. Complete this exercise __________ times a day. Strengthening exercises Straight leg lift  Lie on your back with one knee bent and the other leg out straight. Slowly lift the straight leg without bending the knee. Lift until your foot is about 12 inches (30 cm) off the floor. Hold for 3-5 seconds and slowly lower your leg. Repeat __________ times. Complete this exercise __________ times a day. Single leg dip  Stand between two chairs and put both hands on the backs of the chairs for support. Extend one leg out straight with your body  weight resting on the heel of the standing leg. Slowly bend your standing knee to dip your body to the level that is comfortable for you. Hold for 3-5 seconds. Repeat __________ times. Complete this exercise __________ times a day. Hamstring curls  Stand straight, knees close together, facing the back of a chair. Hold on to the back of a chair with both hands. Keep one leg straight. Bend the other knee while bringing the heel up toward the butt  until the knee is bent at a 90-degree angle (right angle). Hold for 3-5 seconds. Repeat __________ times. Complete this exercise __________ times a day. Wall squat  Stand straight with your back, hips, and head against a wall. Step forward one foot at a time with your back still against the wall. Your feet should be 2 feet (61 cm) from the wall at shoulder width. Keeping your back, hips, and head against the wall, slide down the wall to as close to a sitting position as you can get. Hold for 5-10 seconds, then slowly slide back up. Repeat __________ times. Complete this exercise __________ times a day. Step-ups  Stand in front of a sturdy platform or stool that is about 6 inches (15 cm) high. Slowly step up with your left / right foot, keeping your knee in line with your hip and foot. Do not let your knee bend so far that you cannot see your toes. Hold on to a chair for balance, but do not use it for support. Slowly unlock your knee and lower yourself to the starting position. Repeat __________ times. Complete this exercise __________ times a day. Contact a health care provider if: Your exercises cause pain. Your pain is worse after you exercise. Your pain prevents you from doing your exercises. This information is not intended to replace advice given to you by your health care provider. Make sure you discuss any questions you have with your health care provider. Document Revised: 09/06/2022 Document Reviewed: 09/06/2022 Elsevier Patient Education  2024 [employer]Iliotibial Band Syndrome Rehab Ask your health care provider which exercises are safe for you. Do exercises exactly as told by your provider and adjust them as told. It's normal to feel mild stretching, pulling, tightness, or discomfort as you do these exercises. Stop right away if you feel sudden pain or your pain gets a lot worse. Do not begin these exercises until told by your provider. Stretching and range-of-motion  exercises These exercises warm up your muscles and joints. They also improve the movement and flexibility of your hip and pelvis. Quadriceps stretch, prone  Lie face down (prone) on a firm surface like a bed or padded floor. Bend your left / right knee. Reach back to hold your ankle or pant leg. If you can't reach your ankle or pant leg, use a belt looped around your foot and grab the belt instead. Gently pull your heel toward your butt. Your knee should not slide out to the side. You should feel a stretch in the front of your thigh and knee, also called the quadriceps. Hold this position for __________ seconds. Repeat __________ times. Complete this exercise __________ times a day. Iliotibial band stretch The iliotibial band is a strip of tissue that runs along the outside of your hip down to your knee. Lie on your side with your left / right leg on top. Bend both knees and grab your left / right ankle. Stretch out your bottom arm to help you balance. Slowly bring your top knee  back so your thigh goes behind your back. Slowly lower your top leg toward the floor until you feel a gentle stretch on the outside of your left / right hip and thigh. If you don't feel a stretch and your knee won't go farther, place the heel of your other foot on top of your knee and pull your knee down toward the floor with your foot. Hold this position for __________ seconds. Repeat __________ times. Complete this exercise __________ times a day. Strengthening exercises These exercises build strength and endurance in your hip and pelvis. Endurance means your muscles can keep working even when they're tired. Straight leg raises, side-lying This exercise strengthens the muscles that rotate the leg at the hip and move it away from your body. These muscles are called hip abductors. Lie on your side with your left / right leg on top. Lie so your head, shoulder, hip, and knee line up. You can bend your bottom knee to help  you balance. Roll your hips slightly forward so they're stacked directly over each other. Your left / right knee should face forward. Tense the muscles in your outer thigh and hip. Lift your top leg 4-6 inches (10-15 cm) off the ground. Hold this position for __________ seconds. Slowly lower your leg back down to the starting position. Let your muscles fully relax before doing this exercise again. Repeat __________ times. Complete this exercise __________ times a day. Leg raises, prone This exercise strengthens the muscles that move the hips backward. These muscles are called hip extensors. Lie face down (prone) on your bed or a firm surface. You can put a pillow under your hips for comfort and to support your lower back. Bend your left / right knee so your foot points straight up toward the ceiling. Keep the other leg straight and behind you. Squeeze your butt muscles. Lift your left / right thigh off the firm surface. Do not let your back arch. Tense your thigh muscle as hard as you can without having more knee pain. Hold this position for __________ seconds. Slowly lower your leg to the starting position. Allow your leg to relax all the way. Repeat __________ times. Complete this exercise __________ times a day. Hip hike  Stand sideways on a bottom step. Place your feet so that your left / right leg is on the step, and the other foot is hanging off the side. If you need support for balance, hold onto a railing or wall. Keep your knees straight and your abdomen square, meaning your hips are level. Then, lift your left / right hip up toward the ceiling. Slowly let your leg that's hanging off the step lower towards the floor. Your foot should get closer to the ground. Do not lean or bend your knees during this movement. Repeat __________ times. Complete this exercise __________ times a day. This information is not intended to replace advice given to you by your health care provider. Make sure  you discuss any questions you have with your health care provider. Document Revised: 11/04/2022 Document Reviewed: 11/04/2022 Elsevier Patient Education  2024 [employer]Hand Exercises Hand exercises can be helpful for almost anyone. They can strengthen your hands and improve flexibility and movement. The exercises can also increase blood flow to the hands. These results can make your work and daily tasks easier for you. Hand exercises can be especially helpful for people who have joint pain from arthritis or nerve damage from using their hands over and over. These exercises  can also help people who injure a hand. Exercises Most of these hand exercises are gentle stretching and motion exercises. It is usually safe to do them often throughout the day. Warming up your hands before exercise may help reduce stiffness. You can do this with gentle massage or by placing your hands in warm water for 10-15 minutes. It is normal to feel some stretching, pulling, tightness, or mild discomfort when you begin new exercises. In time, this will improve. Remember to always be careful and stop right away if you feel sudden, very bad pain or your pain gets worse. You want to get better and be safe. Ask your health care provider which exercises are safe for you. Do exercises exactly as told by your provider and adjust them as told. Do not begin these exercises until told by your provider. Knuckle bend or claw fist  Stand or sit with your arm, hand, and all five fingers pointed straight up. Make sure to keep your wrist straight. Gently bend your fingers down toward your palm until the tips of your fingers are touching your palm. Keep your big knuckle straight and only bend the small knuckles in your fingers. Hold this position for 10 seconds. Straighten your fingers back to your starting position. Repeat this exercise 5-10 times with each hand. Full finger fist  Stand or sit with your arm, hand, and all five  fingers pointed straight up. Make sure to keep your wrist straight. Gently bend your fingers into your palm until the tips of your fingers are touching the middle of your palm. Hold this position for 10 seconds. Extend your fingers back to your starting position, stretching every joint fully. Repeat this exercise 5-10 times with each hand. Straight fist  Stand or sit with your arm, hand, and all five fingers pointed straight up. Make sure to keep your wrist straight. Gently bend your fingers at the big knuckle, where your fingers meet your hand, and at the middle knuckle. Keep the knuckle at the tips of your fingers straight and try to touch the bottom of your palm. Hold this position for 10 seconds. Extend your fingers back to your starting position, stretching every joint fully. Repeat this exercise 5-10 times with each hand. Tabletop  Stand or sit with your arm, hand, and all five fingers pointed straight up. Make sure to keep your wrist straight. Gently bend your fingers at the big knuckle, where your fingers meet your hand, as far down as you can. Keep the small knuckles in your fingers straight. Think of forming a tabletop with your fingers. Hold this position for 10 seconds. Extend your fingers back to your starting position, stretching every joint fully. Repeat this exercise 5-10 times with each hand. Finger spread  Place your hand flat on a table with your palm facing down. Make sure your wrist stays straight. Spread your fingers and thumb apart from each other as far as you can until you feel a gentle stretch. Hold this position for 10 seconds. Bring your fingers and thumb tight together again. Hold this position for 10 seconds. Repeat this exercise 5-10 times with each hand. Making circles  Stand or sit with your arm, hand, and all five fingers pointed straight up. Make sure to keep your wrist straight. Make a circle by touching the tip of your thumb to the tip of your index  finger. Hold for 10 seconds. Then open your hand wide. Repeat this motion with your thumb and each of your  fingers. Repeat this exercise 5-10 times with each hand. Thumb motion  Sit with your forearm resting on a table and your wrist straight. Your thumb should be facing up toward the ceiling. Keep your fingers relaxed as you move your thumb. Lift your thumb up as high as you can toward the ceiling. Hold for 10 seconds. Bend your thumb across your palm as far as you can, reaching the tip of your thumb for the small finger (pinkie) side of your palm. Hold for 10 seconds. Repeat this exercise 5-10 times with each hand. Grip strengthening  Hold a stress ball or other soft ball in the middle of your hand. Slowly increase the pressure, squeezing the ball as much as you can without causing pain. Think of bringing the tips of your fingers into the middle of your palm. All of your finger joints should bend when doing this exercise. Hold your squeeze for 10 seconds, then relax. Repeat this exercise 5-10 times with each hand. Contact a health care provider if: Your hand pain or discomfort gets much worse when you do an exercise. Your hand pain or discomfort does not improve within 2 hours after you exercise. If you have either of these problems, stop doing these exercises right away. Do not do them again unless your provider says that you can. Get help right away if: You develop sudden, severe hand pain or swelling. If this happens, stop doing these exercises right away. Do not do them again unless your provider says that you can. This information is not intended to replace advice given to you by your health care provider. Make sure you discuss any questions you have with your health care provider. Document Revised: 09/06/2022 Document Reviewed: 09/06/2022 Elsevier Patient Education  2024 [employer]Sjogren's Syndrome Sjgren's syndrome is an inflammatory disease in which the body's  disease-fighting system (immune system) attacks the glands that produce tears (lacrimal glands) and the glands that produce saliva (salivary glands). This makes the eyes and mouth very dry. Sjgren's syndrome can also affect other parts of the body, causing dryness of the skin, nose, throat, and vagina. Sjgren's syndrome is a long-term (chronic) disorder that has no cure. In some cases, it is linked to other disorders (rheumatic disorders), such as rheumatoid arthritis and systemic lupus erythematosus (SLE). It may affect other parts of the body, such as the: Blood vessels. Joints. Lungs. Kidneys. Liver or pancreas. Brain, nerves, or spinal cord. What are the causes? The cause of this condition is not known. It may be passed along from parent to child (inherited), or it may be a symptom of a rheumatic disorder. What increases the risk? This condition is more likely to develop in: Women. People who are 86-38 years old and older. People who have recently had a viral infection or currently have a viral infection. What are the signs or symptoms? The main symptoms of this condition are: Dry mouth. This may include: A chalky feeling. Difficulty swallowing, speaking, or tasting. Frequent cavities in the teeth. Frequent mouth infections. Dry eyes. This may include: Burning, redness, and itching. Blurry vision. Fluctuating vision. Light sensitivity. Other symptoms may include: Dryness of the skin and the inside of the nose. Eyelid infections. Vaginal dryness (if applicable). Joint pain and stiffness. Muscle pain and stiffness. How is this diagnosed? This condition is diagnosed based on: Your symptoms. Your medical history. A physical exam of your eyes and mouth. Tests, including: A Schirmer test. This tests your tear production. An eye exam that is done  with a magnifying device (slit-lamp exam). An eye test that temporarily stains your eye with special dyes. This shows the extent of  eye damage. Tests to check your salivary gland function. Biopsy. This is a removal of part of a salivary gland from inside your lower lip to be studied under a microscope. Chest X-rays. Blood or urine tests. How is this treated? There is no cure for this condition, but treatment can help you manage your symptoms. You may be asked to see a rheumatologist for further evaluation and treatment. This condition may be treated with: Medicines to help relieve pain and stiffness. Medicines to help relieve inflammation in your body (corticosteroids). These are usually for severe cases. Medicines to help reduce the activity of your immune system (immunosuppressants). These are usually prescribed by your health care provider or a rheumatologist. Moisture replacement therapies to help relieve dryness in your skin, mouth, and eyes. Dry eyes may be treated with: Eye drops or nasal sprays to improve dryness of the eyes. Surgery or insertion of plugs to close the lacrimal glands (punctal occlusion). This helps keep more natural tears in your eyes. Soft contact lenses or hard scleral lenses. These are occasionally used to protect the surface of the eye. Biologic lubricating eye drops (serum tears). These are eye drops made from a person's own blood. They are used in some people with severe dry eye. Follow these instructions at home: Eye care  Use eye drops and other medicines as told by your health care provider. Protect your eyes from the sun and wind with sunglasses or glasses. Blink at least 5-6 times a minute. Maintain properly humidified air. You may want to use a humidifier at home and at work. Avoid smoke. Mouth care Brush your teeth and floss after every meal. Chew sugar-free gum or suck on hard candy. This may help to relieve dry mouth. Use antimicrobial mouthwash daily. Take frequent sips of water or sugar-free drinks. Use saliva substitutes or lip balm as told by your health care provider. See  your dentist every 6 months. General instructions  Take over-the-counter and prescription medicines only as told by your health care provider. Drink enough fluid to keep your urine pale yellow. Keep all follow-up visits. This is important. Contact a health care provider if: You have a fever. You have night sweats. You are always tired. You have unexplained weight loss. You develop itchy skin. You have red patches on your skin. You have a lump or swelling on your neck. Get help right away if: You develop severe eye pain. You develop sudden decreased vision. Summary Sjgren's syndrome is a disease in which the body's immune system attacks the glands that produce tears and the glands that produce saliva. This condition makes the eyes and mouth very dry. Sjgren's syndrome is a long-term (chronic) disorder. There is no cure for this condition, but treatment can help you manage your symptoms. The cause of this condition is not known. You may be asked to see a rheumatologist for further evaluation and treatment. This information is not intended to replace advice given to you by your health care provider. Make sure you discuss any questions you have with your health care provider. Document Revised: 03/22/2021 Document Reviewed: 03/22/2021 Elsevier Patient Education  2024 [employer].

## 2024-08-26 NOTE — Progress Notes (Signed)
 "  Office Visit Note  Patient: [name] [name]             Date of Birth: 28-Dec-1955           MRN: [id number]             PCP: Jarold Medici, MD Referring: Jarold Medici, MD Visit Date: 08/26/2024 Occupation: Data Unavailable  Subjective:  Dry mouth and dry eyes  History of Present Illness: [name] [name] is a 68 y.o. female with Sjogren's, and osteoarthritis.  She returns today after her last visit in June 2025.  She states she started using CPAP on a regular basis and has been experiencing more dry mouth symptoms.  She continues to have dry eyes.  She denies any shortness of breath or lymphadenopathy.  She denies any parotid swelling.  She continues to have pain and discomfort in her joints but she has not noticed any joint swelling.  The pain is mostly in her hands and her knees.  She also has discomfort in her bilateral trochanteric bursae especially at nighttime.    Activities of Daily Living:  Patient reports morning stiffness for 10 minutes.   Patient Reports nocturnal pain.  Difficulty dressing/grooming: Denies Difficulty climbing stairs: Denies Difficulty getting out of chair: Denies Difficulty using hands for taps, buttons, cutlery, and/or writing: Denies  Review of Systems  Constitutional:  Negative for fatigue.  HENT:  Negative for mouth sores and mouth dryness.   Eyes:  Positive for dryness.  Respiratory:  Negative for shortness of breath.   Cardiovascular:  Negative for chest pain and palpitations.  Gastrointestinal:  Negative for blood in stool, constipation and diarrhea.  Endocrine: Negative for increased urination.  Genitourinary:  Negative for involuntary urination.  Musculoskeletal:  Positive for joint pain, gait problem, joint pain, myalgias, morning stiffness and myalgias. Negative for joint swelling, muscle weakness and muscle tenderness.  Skin:  Positive for color change. Negative for rash, hair loss and sensitivity to sunlight.  Allergic/Immunologic:  Negative for susceptible to infections.  Neurological:  Positive for numbness. Negative for dizziness and headaches.  Hematological:  Negative for swollen glands.  Psychiatric/Behavioral:  Positive for sleep disturbance. Negative for depressed mood. The patient is not nervous/anxious.     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   Osteoarthritis 06/07/2023   Other cervical disc displacement, unspecified cervical region 06/07/2023   Other cervical disc degeneration, unspecified cervical region 06/07/2023   Other spondylosis with radiculopathy, cervical region 06/07/2023   Paresthesia of left arm 06/04/2023   Upper airway cough syndrome 05/05/2023   Coronary artery calcification 11/03/2022   Sjogren's syndrome 07/01/2022   Acute pain of left shoulder 04/23/2022   Soft tissue mass 04/23/2022   Postnasal drip 04/23/2022   Class 1 obesity due to excess calories with serious comorbidity and body mass index (BMI) of 31.0 to 31.9 in adult 04/23/2022   Dysfunction of both eustachian tubes 05/24/2021   Primary osteoarthritis of both knees 07/30/2018   Primary osteoarthritis of both hands 07/30/2018   Foot pain 05/15/2018   OSA (obstructive sleep apnea) 02/23/2017   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   Snoring  03/25/2015   Hypersomnia with sleep apnea 03/25/2015   Body mass index (BMI) of 30.0-30.9 in adult 02/20/2015   Bilateral dry eyes 10/30/2013   MGD (meibomian gland disease) 10/30/2013   Nuclear sclerosis of both eyes 10/30/2013   Multinodular goiter (nontoxic) 02/08/2012   Acute pancreatitis  10/26/2011   Nausea and vomiting in adult 10/26/2011   Dehydration 10/26/2011   Gastroenteritis 10/26/2011   Hypokalemia 10/26/2011   Elevated liver function tests 10/26/2011   Acid reflux    Thyroid  nodule    Abdominal bloating 07/08/2011    Past Medical History:  Diagnosis Date   Abnormal liver enzymes    Acid reflux    Arthritis    back and neck   Bloating    Bronchitis    Chest pain, unspecified    Diarrhea    Dyspepsia    Encounter for Medicare annual wellness exam 06/13/2023   Esophageal reflux    H/O acute pancreatitis    H/O hiatal hernia    Headache(784.0)    migraines   Hemorrhage of rectum and anus    Hypercholesteremia    Nausea    Sjogren's disease    Sleep apnea    Spinal stenosis, cervical region    Thyroid  disease    Thyroid  nodule     Family History  Problem Relation Age of Onset   Colon cancer Mother    Cancer Mother    Early death Mother    Hearing loss Mother    Early death Father    Pancreatic cancer Brother    Healthy Daughter    Healthy Son    Heart disease Maternal Grandfather    Hypertension Maternal Grandfather    Cancer Brother    Early death Brother    Past Surgical History:  Procedure Laterality Date   ABDOMINAL HYSTERECTOMY  2007   total with BSO   BIOPSY THYROID   02/16/2023   capsulorhexis Left 09/13/2023   (eye procedure)   CHOLECYSTECTOMY  1989   COLONOSCOPY     ECTOPIC PREGNANCY SURGERY  1984   ESOPHAGOGASTRODUODENOSCOPY     EUS  12/21/2011   Procedure: ESOPHAGEAL ENDOSCOPIC ULTRASOUND (EUS) RADIAL;  Surgeon: Elsie Cree, MD;  Location: [facility];  Service: Endoscopy;  Laterality: N/A;   eye cautery     EYE SURGERY     lasik   EYE SURGERY Left 2004   cataract extraction    KNEE ARTHROSCOPY Right 06/11/2020   torn meniscus    Social History   Tobacco Use   Smoking status: Never    Passive exposure: Past   Smokeless tobacco: Never  Vaping Use   Vaping status: Never Used  Substance Use Topics    Alcohol use: Yes    Comment: 1-2 times a year   Drug use: No   Social History   Social History Narrative   Married.   Drinks occasional sweet tea.   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     Immunization History  Administered Date(s) Administered   Fluad Trivalent(High Dose 65+) 05/16/2023   INFLUENZA, HIGH DOSE SEASONAL PF 06/18/2024   Influenza,inj,Quad PF,6+ Mos 06/24/2022   Influenza-Unspecified 09/01/2022   PFIZER(Purple Top)SARS-COV-2 Vaccination 11/22/2019, 12/13/2019, 07/01/2020, 03/26/2021   PNEUMOCOCCAL CONJUGATE-20 08/06/2024   Tdap 07/13/2017   Zoster Recombinant(Shingrix ) 02/15/2022, 05/10/2022     Objective: Vital Signs: BP 122/85   Pulse 83   Temp 97.9 F (36.6 C)   Resp 15   Ht 5' 6 (1.676 m)  Wt 191 lb 6.4 oz (86.8 kg)   BMI 30.89 kg/m    Physical Exam Vitals and nursing note reviewed.  Constitutional:      Appearance: She is well-developed.  HENT:     Head: Normocephalic and atraumatic.  Eyes:     Conjunctiva/sclera: Conjunctivae normal.  Cardiovascular:     Rate and Rhythm: Normal rate and regular rhythm.     Heart sounds: Normal heart sounds.  Pulmonary:     Effort: Pulmonary effort is normal.     Breath sounds: Normal breath sounds.  Abdominal:     General: Bowel sounds are normal.     Palpations: Abdomen is soft.  Musculoskeletal:     Cervical back: Normal range of motion.  Skin:    General: Skin is warm and dry.     Capillary Refill: Capillary refill takes less than 2 seconds.  Neurological:     Mental Status: She is alert and oriented to person, place, and time.  Psychiatric:        Behavior: Behavior normal.      Musculoskeletal Exam: Cervical, thoracic and lumbar spine were in good range of motion.  There was no SI joint tenderness.  Shoulder joints, elbow joints, wrist joints, MCPs, PIPs and DIPs were in good range of motion with no synovitis.  Bilateral PIP and DIP thickening was noted.   Hip joints and knee joints were in good range of motion.  She had warmth and effusion in her right knee joint.  She had tenderness over bilateral trochanteric bursa.  There was no tenderness over ankles or MTPs.   CDAI Exam: CDAI Score: -- Patient Global: --; Provider Global: -- Swollen: --; Tender: -- Joint Exam 08/26/2024   No joint exam has been documented for this visit   There is currently no information documented on the homunculus. Go to the Rheumatology activity and complete the homunculus joint exam.  Investigation: No additional findings.  Imaging: No results found.  Recent Labs: Lab Results  Component Value Date   WBC 5.2 02/26/2024   HGB 13.9 02/26/2024   PLT 220 02/26/2024   NA 143 06/18/2024   K 5.0 06/18/2024   CL 104 06/18/2024   CO2 23 06/18/2024   GLUCOSE 75 06/18/2024   BUN 16 06/18/2024   CREATININE 0.83 06/18/2024   BILITOT 1.2 06/18/2024   ALKPHOS 108 06/18/2024   AST 19 06/18/2024   ALT 19 06/18/2024   PROT 6.9 06/18/2024   ALBUMIN 4.8 06/18/2024   CALCIUM  9.7 06/18/2024   GFRAA 88 09/29/2020    Speciality Comments: PLQ eye exam: 08/27/2019 normal. [facility]. Follow up in 6 months. PLQ started 11/20 x . -no benefit  Procedures:  No procedures performed Allergies: Morphine and codeine, Other, and Sulfa antibiotics   Assessment / Plan:     Visit Diagnoses: Sjogren's syndrome with other organ involvement - +ANA, +SSA Ab,-SSB Ab -RF: -He continues to have dry mouth and dry eyes.  She states her dry mouth symptoms are worse since she has been using CPAP.  She denies any shortness of breath, lymphadenopathy or parotid swelling.  No lymphadenopathy was noted.  Her lungs were clear to auscultation.  I will check labs today.  Over-the-counter products were discussed.  Plan: Urinalysis, Routine w reflex microscopic, CBC with Differential/Platelet, ANA, C3 and C4, Anti-DNA antibody, double-stranded, Serum protein electrophoresis with  reflex,  Antineutrophil cytoplasmic antibody (ANCA) positive - Repeat ANCA was negative.  No clinical features of vasculitis. Sed rate has been normal.  Chronic cough - Resolved.  Trochanteric bursitis of both hips-she has been experiencing nocturnal pain and discomfort over trochanteric region.  A handout on IT band stretches was given.  I also discussed future cortisone injection if her symptoms get worse.  Patient will contact us .  Primary osteoarthritis of both hands-she has severe osteoarthritis in her bilateral hands.  Joint protection muscle strengthening was discussed.  A handout on back exercises was given.  Effusion, right knee-patient has been followed by orthopedics.  Patient states she was advised to right total knee replacement and had meniscal tear repair.  Primary osteoarthritis of both knees --history of arthroscopic surgery of the right knee.  Synovial thickening of the right knee noted.  History of thyroid  disease  History of sleep apnea  Other fatigue  Orders: Orders Placed This Encounter  Procedures   Urinalysis, Routine w reflex microscopic   CBC with Differential/Platelet   ANA   C3 and C4   Anti-DNA antibody, double-stranded   Serum protein electrophoresis with reflex   No orders of the defined types were placed in this encounter.    Follow-Up Instructions: Return in about 6 months (around 02/24/2025) for Sjogren's.   Maya Nash, MD  Note - This record has been created using Animal nutritionist.  Chart creation errors have been sought, but may not always  have been located. Such creation errors do not reflect on  the standard of medical care. "

## 2024-08-26 NOTE — Addendum Note (Signed)
 Addended by: RANDY HAMP SAILOR on: 08/26/2024 11:53 AM   Modules accepted: Orders

## 2024-08-27 ENCOUNTER — Ambulatory Visit: Admitting: Rheumatology

## 2024-08-27 DIAGNOSIS — Z8669 Personal history of other diseases of the nervous system and sense organs: Secondary | ICD-10-CM

## 2024-08-27 DIAGNOSIS — R5383 Other fatigue: Secondary | ICD-10-CM

## 2024-08-27 DIAGNOSIS — M3509 Sicca syndrome with other organ involvement: Secondary | ICD-10-CM

## 2024-08-27 DIAGNOSIS — R053 Chronic cough: Secondary | ICD-10-CM

## 2024-08-27 DIAGNOSIS — M170 Bilateral primary osteoarthritis of knee: Secondary | ICD-10-CM

## 2024-08-27 DIAGNOSIS — Z8639 Personal history of other endocrine, nutritional and metabolic disease: Secondary | ICD-10-CM

## 2024-08-27 DIAGNOSIS — M25461 Effusion, right knee: Secondary | ICD-10-CM

## 2024-08-27 DIAGNOSIS — Z79899 Other long term (current) drug therapy: Secondary | ICD-10-CM

## 2024-08-27 DIAGNOSIS — R7689 Other specified abnormal immunological findings in serum: Secondary | ICD-10-CM

## 2024-08-27 DIAGNOSIS — M19041 Primary osteoarthritis, right hand: Secondary | ICD-10-CM

## 2024-08-27 MED ORDER — EZETIMIBE 10 MG PO TABS: 10.0000 mg | ORAL_TABLET | Freq: Every day | ORAL | 3 refills | Status: AC

## 2024-08-27 NOTE — Addendum Note (Signed)
 Addended by: BETHENA POWELL SAUNDERS on: 08/27/2024 09:48 AM   Modules accepted: Orders

## 2024-08-27 NOTE — Addendum Note (Signed)
 Addended by: BETHENA POWELL SAUNDERS on: 08/27/2024 09:49 AM   Modules accepted: Orders

## 2024-08-29 ENCOUNTER — Ambulatory Visit: Payer: Self-pay | Admitting: Rheumatology

## 2024-08-29 NOTE — Progress Notes (Signed)
 ANA remains low titer positive, UA negative, CBC normal, complements normal, double-stranded DNA negative.  Labs do not indicate an autoimmune disease flare.

## 2024-08-31 LAB — CBC WITH DIFFERENTIAL/PLATELET
Absolute Lymphocytes: 1708 {cells}/uL (ref 850–3900)
Absolute Monocytes: 280 {cells}/uL (ref 200–950)
Basophils Absolute: 22 {cells}/uL (ref 0–200)
Basophils Relative: 0.4 %
Eosinophils Absolute: 224 {cells}/uL (ref 15–500)
Eosinophils Relative: 4 %
HCT: 41.9 % (ref 35.9–46.0)
Hemoglobin: 13.5 g/dL (ref 11.7–15.5)
MCH: 27.5 pg (ref 27.0–33.0)
MCHC: 32.2 g/dL (ref 31.6–35.4)
MCV: 85.3 fL (ref 81.4–101.7)
MPV: 11.5 fL (ref 7.5–12.5)
Monocytes Relative: 5 %
Neutro Abs: 3366 {cells}/uL (ref 1500–7800)
Neutrophils Relative %: 60.1 %
Platelets: 220 Thousand/uL (ref 140–400)
RBC: 4.91 Million/uL (ref 3.80–5.10)
RDW: 13.1 % (ref 11.0–15.0)
Total Lymphocyte: 30.5 %
WBC: 5.6 Thousand/uL (ref 3.8–10.8)

## 2024-08-31 LAB — URINALYSIS, ROUTINE W REFLEX MICROSCOPIC
Bilirubin Urine: NEGATIVE
Glucose, UA: NEGATIVE
Hgb urine dipstick: NEGATIVE
Ketones, ur: NEGATIVE
Leukocytes,Ua: NEGATIVE
Nitrite: NEGATIVE
Protein, ur: NEGATIVE
Specific Gravity, Urine: 1.011 (ref 1.001–1.035)
pH: 7 (ref 5.0–8.0)

## 2024-08-31 LAB — ANA: Anti Nuclear Antibody (ANA): POSITIVE — AB

## 2024-08-31 LAB — PROTEIN ELECTROPHORESIS, SERUM, WITH REFLEX
Albumin ELP: 4.4 g/dL (ref 3.8–4.8)
Alpha 1: 0.4 g/dL — ABNORMAL HIGH (ref 0.2–0.3)
Alpha 2: 0.6 g/dL (ref 0.5–0.9)
Beta 2: 0.3 g/dL (ref 0.2–0.5)
Beta Globulin: 0.5 g/dL (ref 0.4–0.6)
Gamma Globulin: 0.9 g/dL (ref 0.8–1.7)
Total Protein: 7 g/dL (ref 6.1–8.1)

## 2024-08-31 LAB — C3 AND C4
C3 Complement: 110 mg/dL (ref 83–193)
C4 Complement: 33 mg/dL (ref 15–57)

## 2024-08-31 LAB — IFE INTERPRETATION

## 2024-08-31 LAB — ANTI-NUCLEAR AB-TITER (ANA TITER): ANA Titer 1: 1:80 {titer} — ABNORMAL HIGH

## 2024-08-31 LAB — ANTI-DNA ANTIBODY, DOUBLE-STRANDED: ds DNA Ab: 1 [IU]/mL

## 2024-09-01 NOTE — Progress Notes (Signed)
 ANA low titer positive and is stable, double-stranded ENA negative,, C3-C4 normal, CBC normal, UA negative, IFE normal.  Labs do not indicate an autoimmune disease flare.

## 2024-09-09 ENCOUNTER — Encounter: Payer: Self-pay | Admitting: Internal Medicine

## 2025-02-24 ENCOUNTER — Ambulatory Visit: Admitting: Rheumatology

## 2025-06-23 ENCOUNTER — Encounter: Payer: Self-pay | Admitting: Internal Medicine

## 2025-08-25 ENCOUNTER — Ambulatory Visit: Admitting: Adult Health
